# Patient Record
Sex: Female | Born: 1960 | Race: White | Hispanic: No | Marital: Married | Smoking: Former smoker
Health system: Southern US, Community
[De-identification: ages and names within clinical notes are randomized; demographics above are authoritative.]

## PROBLEM LIST (undated history)

## (undated) DIAGNOSIS — J45909 Unspecified asthma, uncomplicated: Secondary | ICD-10-CM

## (undated) DIAGNOSIS — G4730 Sleep apnea, unspecified: Secondary | ICD-10-CM

## (undated) DIAGNOSIS — Z0000 Encounter for general adult medical examination without abnormal findings: Secondary | ICD-10-CM

## (undated) DIAGNOSIS — E559 Vitamin D deficiency, unspecified: Secondary | ICD-10-CM

## (undated) DIAGNOSIS — M47815 Spondylosis without myelopathy or radiculopathy, thoracolumbar region: Secondary | ICD-10-CM

## (undated) DIAGNOSIS — G729 Myopathy, unspecified: Secondary | ICD-10-CM

## (undated) DIAGNOSIS — F419 Anxiety disorder, unspecified: Secondary | ICD-10-CM

## (undated) DIAGNOSIS — H409 Unspecified glaucoma: Secondary | ICD-10-CM

## (undated) DIAGNOSIS — F447 Conversion disorder with mixed symptom presentation: Secondary | ICD-10-CM

## (undated) DIAGNOSIS — F418 Other specified anxiety disorders: Secondary | ICD-10-CM

## (undated) DIAGNOSIS — G1223 Primary lateral sclerosis: Secondary | ICD-10-CM

## (undated) DIAGNOSIS — M810 Age-related osteoporosis without current pathological fracture: Secondary | ICD-10-CM

## (undated) DIAGNOSIS — IMO0001 Reserved for inherently not codable concepts without codable children: Secondary | ICD-10-CM

## (undated) DIAGNOSIS — T8859XA Other complications of anesthesia, initial encounter: Secondary | ICD-10-CM

## (undated) DIAGNOSIS — G43909 Migraine, unspecified, not intractable, without status migrainosus: Secondary | ICD-10-CM

## (undated) DIAGNOSIS — M479 Spondylosis, unspecified: Secondary | ICD-10-CM

## (undated) DIAGNOSIS — E538 Deficiency of other specified B group vitamins: Secondary | ICD-10-CM

## (undated) DIAGNOSIS — M8580 Other specified disorders of bone density and structure, unspecified site: Secondary | ICD-10-CM

## (undated) DIAGNOSIS — E782 Mixed hyperlipidemia: Secondary | ICD-10-CM

## (undated) DIAGNOSIS — N189 Chronic kidney disease, unspecified: Secondary | ICD-10-CM

## (undated) DIAGNOSIS — M542 Cervicalgia: Secondary | ICD-10-CM

## (undated) DIAGNOSIS — J181 Lobar pneumonia, unspecified organism: Secondary | ICD-10-CM

## (undated) DIAGNOSIS — Z931 Gastrostomy status: Secondary | ICD-10-CM

## (undated) DIAGNOSIS — J329 Chronic sinusitis, unspecified: Secondary | ICD-10-CM

## (undated) DIAGNOSIS — M5030 Other cervical disc degeneration, unspecified cervical region: Secondary | ICD-10-CM

## (undated) DIAGNOSIS — K5903 Drug induced constipation: Secondary | ICD-10-CM

## (undated) DIAGNOSIS — T7840XA Allergy, unspecified, initial encounter: Secondary | ICD-10-CM

## (undated) DIAGNOSIS — T4145XA Adverse effect of unspecified anesthetic, initial encounter: Secondary | ICD-10-CM

## (undated) DIAGNOSIS — Z8619 Personal history of other infectious and parasitic diseases: Secondary | ICD-10-CM

## (undated) HISTORY — PX: AUGMENTATION MAMMAPLASTY: SUR837

## (undated) HISTORY — DX: Other specified disorders of bone density and structure, unspecified site: M85.80

## (undated) HISTORY — PX: APPENDECTOMY: SHX54

## (undated) HISTORY — DX: Deficiency of other specified B group vitamins: E53.8

## (undated) HISTORY — PX: NECK SURGERY: SHX720

## (undated) HISTORY — DX: Cervicalgia: M54.2

## (undated) HISTORY — DX: Chronic sinusitis, unspecified: J32.9

## (undated) HISTORY — DX: Sleep apnea, unspecified: G47.30

## (undated) HISTORY — DX: Mixed hyperlipidemia: E78.2

## (undated) HISTORY — PX: COLONOSCOPY: SHX174

## (undated) HISTORY — DX: Personal history of other infectious and parasitic diseases: Z86.19

## (undated) HISTORY — DX: Conversion disorder with mixed symptom presentation: F44.7

## (undated) HISTORY — DX: Encounter for general adult medical examination without abnormal findings: Z00.00

## (undated) HISTORY — PX: BREAST ENHANCEMENT SURGERY: SHX7

## (undated) HISTORY — DX: Lobar pneumonia, unspecified organism: J18.1

## (undated) HISTORY — PX: ABDOMINAL HYSTERECTOMY: SHX81

## (undated) HISTORY — DX: Allergy, unspecified, initial encounter: T78.40XA

## (undated) HISTORY — DX: Age-related osteoporosis without current pathological fracture: M81.0

## (undated) HISTORY — DX: Unspecified glaucoma: H40.9

## (undated) HISTORY — DX: Vitamin D deficiency, unspecified: E55.9

## (undated) HISTORY — DX: Other specified anxiety disorders: F41.8

## (undated) HISTORY — PX: PEG PLACEMENT: SHX5437

## (undated) HISTORY — PX: CHOLECYSTECTOMY: SHX55

## (undated) HISTORY — DX: Unspecified asthma, uncomplicated: J45.909

## (undated) HISTORY — PX: PORTA CATH REMOVAL: CATH118286

## (undated) HISTORY — PX: PORTA CATH INSERTION: CATH118285

## (undated) HISTORY — PX: ECTOPIC PREGNANCY SURGERY: SHX613

---

## 2008-08-17 ENCOUNTER — Emergency Department (HOSPITAL_BASED_OUTPATIENT_CLINIC_OR_DEPARTMENT_OTHER): Admission: EM | Admit: 2008-08-17 | Discharge: 2008-08-17 | Payer: Self-pay | Admitting: Emergency Medicine

## 2008-09-16 ENCOUNTER — Emergency Department (HOSPITAL_BASED_OUTPATIENT_CLINIC_OR_DEPARTMENT_OTHER): Admission: EM | Admit: 2008-09-16 | Discharge: 2008-09-17 | Payer: Self-pay | Admitting: Emergency Medicine

## 2010-09-26 ENCOUNTER — Encounter: Payer: Self-pay | Admitting: *Deleted

## 2010-09-26 ENCOUNTER — Emergency Department (HOSPITAL_BASED_OUTPATIENT_CLINIC_OR_DEPARTMENT_OTHER)
Admission: EM | Admit: 2010-09-26 | Discharge: 2010-09-26 | Disposition: A | Discharge status: Home / Self Care | Payer: Medicare Other | Attending: Emergency Medicine | Admitting: Emergency Medicine

## 2010-09-26 ENCOUNTER — Emergency Department (INDEPENDENT_AMBULATORY_CARE_PROVIDER_SITE_OTHER): Payer: Medicare Other

## 2010-09-26 DIAGNOSIS — R609 Edema, unspecified: Secondary | ICD-10-CM

## 2010-09-26 DIAGNOSIS — X500XXA Overexertion from strenuous movement or load, initial encounter: Secondary | ICD-10-CM | POA: Insufficient documentation

## 2010-09-26 DIAGNOSIS — S93409A Sprain of unspecified ligament of unspecified ankle, initial encounter: Secondary | ICD-10-CM

## 2010-09-26 DIAGNOSIS — M25579 Pain in unspecified ankle and joints of unspecified foot: Secondary | ICD-10-CM

## 2010-09-26 DIAGNOSIS — F17200 Nicotine dependence, unspecified, uncomplicated: Secondary | ICD-10-CM | POA: Insufficient documentation

## 2010-09-26 NOTE — ED Notes (Signed)
EDNP [facility] notified of pt's VS

## 2010-09-26 NOTE — ED Notes (Signed)
D/c home with husband- no new rx given- pt alert and conversant at time of d/c- ice pack given for home use

## 2010-09-26 NOTE — ED Notes (Signed)
Pt states that she fell about a month ago and has had problems with her right ankle since. Does not want a cast d/t Doreatha Martin

## 2010-09-26 NOTE — ED Provider Notes (Signed)
History     CSN: [id number] Arrival date & time: 09/26/2010  4:54 PM  Chief Complaint  Patient presents with  . Ankle Pain    HPI  (Consider location/radiation/quality/duration/timing/severity/associated sxs/prior treatment)  HPI Comments: Pt states that she twisted it about 1 month ago and she has continued to have pain:pt states that she doesn't want a cast do her lou gehrig's:pt states that she is primarily in the wheelchair  Patient is a 50 y.o. female presenting with ankle pain. The history is provided by the patient.  Ankle Pain  The incident occurred more than 1 week ago. The incident occurred at home. The injury mechanism was torsion. The pain is present in the right ankle. The quality of the pain is described as aching. The pain is moderate. The pain has been constant since onset. She reports no foreign bodies present.    Past Medical History  Diagnosis Date  . [name]     Past Surgical History  Procedure Date  . Abdominal hysterectomy   . Cholecystectomy   . Appendectomy     History reviewed. No pertinent family history.  History  Substance Use Topics  . Smoking status: Current Everyday Smoker  . Smokeless tobacco: Not on file  . Alcohol Use: No    OB History    Grav Para Term Preterm Abortions TAB SAB Ect Mult Living                  Review of Systems  Review of Systems  Respiratory: Negative.   Cardiovascular: Negative.   Skin: Negative.   Neurological: Positive for weakness.    Allergies  Review of patient's allergies indicates not on file.  Home Medications  No current outpatient prescriptions on file.  Physical Exam    BP 88/30  Pulse 98  Temp(Src) 99.1 F (37.3 C) (Oral)  Resp 18  Ht 5' 3.5" (1.613 m)  Wt 120 lb (54.432 kg)  BMI 20.92 kg/m2  SpO2 93%  Physical Exam  Nursing note and vitals reviewed. Constitutional: She is oriented to person, place, and time. She appears well-developed and well-nourished.    Cardiovascular: Normal rate.   Pulmonary/Chest: Effort normal. She has rales.  Musculoskeletal:       Pt has generalized tenderness to the right ankle:no obvious deformity or swelling when compared with other ankle  Neurological: She is alert and oriented to person, place, and time.  Skin: Skin is warm and dry.    ED Course  Procedures (including critical care time)  No results found for this or any previous visit. Dg Ankle Complete Right  09/26/2010  *RADIOLOGY REPORT*  Clinical Data: Twisting injury 1 month ago.  Pain.  RIGHT ANKLE - COMPLETE 3+ VIEW  Comparison: None.  Findings: Mild lateral malleolar soft tissue swelling. No acute fracture or dislocation.  Talar dome and base of fifth metatarsal are intact.  Mild tibiotalar osteoarthritis. Remote trauma versus accessory ossicle adjacent the medial malleolus.  IMPRESSION: Mild soft tissue swelling, without acute osseous abnormality.  Original Report Authenticated By: Consuello Bossier, M.D.    MDM Pt placed in aso for comfort:pt is okay to follow up as needed:pt is on a morphine WUJ:WJXBJ pressure likely related to disease process:pt is on hospice        Teressa Lower, NP 09/26/10 1905

## 2010-09-27 NOTE — ED Provider Notes (Signed)
Medical screening examination/treatment/procedure(s) were performed by non-physician practitioner and as supervising physician I was immediately available for consultation/collaboration.   Shelda Jakes, MD 09/27/10 [phone]

## 2012-05-18 ENCOUNTER — Inpatient Hospital Stay (HOSPITAL_BASED_OUTPATIENT_CLINIC_OR_DEPARTMENT_OTHER)
Admission: EM | Admit: 2012-05-18 | Discharge: 2012-05-20 | DRG: 603 | Disposition: A | Discharge status: Home / Self Care | Payer: Medicare Other | Attending: Internal Medicine | Admitting: Internal Medicine

## 2012-05-18 ENCOUNTER — Emergency Department (HOSPITAL_BASED_OUTPATIENT_CLINIC_OR_DEPARTMENT_OTHER): Payer: Medicare Other

## 2012-05-18 ENCOUNTER — Encounter (HOSPITAL_BASED_OUTPATIENT_CLINIC_OR_DEPARTMENT_OTHER): Payer: Self-pay | Admitting: Emergency Medicine

## 2012-05-18 DIAGNOSIS — R031 Nonspecific low blood-pressure reading: Secondary | ICD-10-CM | POA: Diagnosis present

## 2012-05-18 DIAGNOSIS — L02211 Cutaneous abscess of abdominal wall: Secondary | ICD-10-CM

## 2012-05-18 DIAGNOSIS — L988 Other specified disorders of the skin and subcutaneous tissue: Secondary | ICD-10-CM | POA: Diagnosis present

## 2012-05-18 DIAGNOSIS — G1221 Amyotrophic lateral sclerosis: Secondary | ICD-10-CM

## 2012-05-18 DIAGNOSIS — G43909 Migraine, unspecified, not intractable, without status migrainosus: Secondary | ICD-10-CM | POA: Diagnosis present

## 2012-05-18 DIAGNOSIS — F447 Conversion disorder with mixed symptom presentation: Secondary | ICD-10-CM | POA: Diagnosis present

## 2012-05-18 DIAGNOSIS — R109 Unspecified abdominal pain: Secondary | ICD-10-CM | POA: Diagnosis present

## 2012-05-18 DIAGNOSIS — L02219 Cutaneous abscess of trunk, unspecified: Principal | ICD-10-CM | POA: Diagnosis present

## 2012-05-18 DIAGNOSIS — L03311 Cellulitis of abdominal wall: Secondary | ICD-10-CM | POA: Diagnosis present

## 2012-05-18 DIAGNOSIS — I959 Hypotension, unspecified: Secondary | ICD-10-CM | POA: Diagnosis present

## 2012-05-18 DIAGNOSIS — L03319 Cellulitis of trunk, unspecified: Principal | ICD-10-CM | POA: Diagnosis present

## 2012-05-18 DIAGNOSIS — F411 Generalized anxiety disorder: Secondary | ICD-10-CM | POA: Diagnosis present

## 2012-05-18 LAB — COMPREHENSIVE METABOLIC PANEL
ALT: 12 U/L (ref 0–35)
CO2: 28 mEq/L (ref 19–32)
Calcium: 9.4 mg/dL (ref 8.4–10.5)
Creatinine, Ser: 1 mg/dL (ref 0.50–1.10)
GFR calc Af Amer: 74 mL/min — ABNORMAL LOW (ref >90)
GFR calc non Af Amer: 64 mL/min — ABNORMAL LOW (ref >90)
Glucose, Bld: 88 mg/dL (ref 70–99)
Sodium: 140 mEq/L (ref 135–145)
Total Protein: 6.9 g/dL (ref 6.0–8.3)

## 2012-05-18 LAB — CBC WITH DIFFERENTIAL/PLATELET
Eosinophils Absolute: 0.2 10*3/uL (ref 0.0–0.7)
Eosinophils Relative: 2 % (ref 0–5)
HCT: 38.4 % (ref 36.0–46.0)
Lymphs Abs: 2.6 10*3/uL (ref 0.7–4.0)
MCH: 30.3 pg (ref 26.0–34.0)
MCV: 88.3 fL (ref 78.0–100.0)
Monocytes Absolute: 0.8 10*3/uL (ref 0.1–1.0)
Platelets: 209 10*3/uL (ref 150–400)
RBC: 4.35 MIL/uL (ref 3.87–5.11)
RDW: 12 % (ref 11.5–15.5)

## 2012-05-18 LAB — LIPASE, BLOOD: Lipase: 20 U/L (ref 11–59)

## 2012-05-18 MED ORDER — ONDANSETRON HCL 4 MG/2ML IJ SOLN
4.0000 mg | Freq: Once | INTRAMUSCULAR | Status: AC
  Administered 2012-05-18: 4 mg via INTRAVENOUS
  Filled 2012-05-18: qty 2

## 2012-05-18 MED ORDER — SODIUM CHLORIDE 0.9 % IV BOLUS (SEPSIS)
1000.0000 mL | Freq: Once | INTRAVENOUS | Status: AC
  Administered 2012-05-18: 1000 mL via INTRAVENOUS

## 2012-05-18 MED ORDER — IOHEXOL 300 MG/ML  SOLN: 100.0000 mL | Freq: Once | INTRAMUSCULAR | Status: AC | PRN

## 2012-05-18 MED ORDER — HYDROMORPHONE HCL PF 1 MG/ML IJ SOLN
1.0000 mg | Freq: Once | INTRAMUSCULAR | Status: AC
  Administered 2012-05-18: 1 mg via INTRAVENOUS
  Filled 2012-05-18: qty 1

## 2012-05-18 NOTE — ED Notes (Signed)
Epigastric pain and bloating. Exudate from previous PEG site (DC'd 2 yrs ago).  Saw PMD yesterday.  No rx given.  CT abd today at [facility]. Sx getting worse.

## 2012-05-18 NOTE — ED Provider Notes (Signed)
History     CSN: [id number]  Arrival date & time 05/18/12  2147   First MD Initiated Contact with Patient 05/18/12 2252      Chief Complaint  Patient presents with  . Abdominal Pain    (Consider location/radiation/quality/duration/timing/severity/associated sxs/prior treatment) Patient is a 52 y.o. female presenting with abdominal pain. The history is provided by the patient.  Abdominal Pain Pain location:  Periumbilical and epigastric Pain quality: bloating, sharp, shooting and stabbing   Pain radiates to:  Does not radiate Pain severity:  Severe Onset quality:  Gradual Duration:  2 days Timing:  Constant Progression:  Worsening Chronicity:  New Context: eating and previous surgery   Context comment:  States that 2 years she had her feeding tube removed and for the last 2 days has had worsening pain around the scar and today had green drainage from the site Relieved by:  Nothing Worsened by:  Eating, movement and palpation Ineffective treatments:  None tried Associated symptoms: anorexia, flatus and nausea   Associated symptoms: no cough, no diarrhea, no dysuria, no fever, no shortness of breath and no vomiting   Risk factors: no alcohol abuse and no aspirin use   Risk factors comment:  Hx of lou gehrig but resolution of sx   Past Medical History  Diagnosis Date  . [name]     Past Surgical History  Procedure Laterality Date  . Abdominal hysterectomy    . Cholecystectomy    . Appendectomy    . Peg placement    . Neck surgery      No family history on file.  History  Substance Use Topics  . Smoking status: Current Every Day Smoker -- 0.50 packs/day  . Smokeless tobacco: Not on file  . Alcohol Use: No    OB History   Grav Para Term Preterm Abortions TAB SAB Ect Mult Living                  Review of Systems  Constitutional: Negative for fever.  Respiratory: Negative for cough and shortness of breath.   Gastrointestinal: Positive for  nausea, abdominal pain, anorexia and flatus. Negative for vomiting and diarrhea.  Genitourinary: Negative for dysuria.  All other systems reviewed and are negative.    Allergies  Codeine  Home Medications   Current Outpatient Rx  Name  Route  Sig  Dispense  Refill  . buPROPion (WELLBUTRIN XL) 300 MG 24 hr tablet   Oral   Take 300 mg by mouth daily.         . clonazePAM (KLONOPIN) 1 MG tablet   Oral   Take 1 mg by mouth 2 (two) times daily as needed for anxiety.         Marland Kitchen estradiol (ESTRACE) 0.5 MG tablet   Oral   Take 0.5 mg by mouth daily.         . SUMAtriptan (IMITREX) 100 MG tablet   Oral   Take 100 mg by mouth as needed for migraine.         . baclofen (LIORESAL) 10 MG tablet   Oral   Take 10 mg by mouth 2 (two) times daily.           Marland Kitchen desvenlafaxine (PRISTIQ) 100 MG 24 hr tablet   Oral   Take 100 mg by mouth daily.           . diazepam (VALIUM) 5 MG tablet   Oral   Take 5 mg by mouth  2 (two) times daily.           . methadone (DOLOPHINE) 5 MG tablet   Oral   Take 15 mg by mouth 2 (two) times daily.           . sodium chloride 0.9 % SOLN 100 mL with morphine 50 MG/ML SOLN 5 mg/mL   Intravenous   Inject 5 mg/hr into the vein continuous.           Marland Kitchen tiZANidine (ZANAFLEX) 4 MG tablet   Oral   Take 4 mg by mouth every morning.           Marland Kitchen tiZANidine (ZANAFLEX) 4 MG tablet   Oral   Take 8 mg by mouth at bedtime.           . TRAZODONE HCL PO   Oral   Take 1 tablet by mouth at bedtime.             BP 115/60  Pulse 76  Temp(Src) 98.7 F (37.1 C) (Oral)  Resp 14  Ht 5\' 3"  (1.6 m)  Wt 118 lb (53.524 kg)  BMI 20.91 kg/m2  SpO2 96%  Physical Exam  Nursing note and vitals reviewed. Constitutional: She is oriented to person, place, and time. She appears well-developed and well-nourished. She appears distressed.  HENT:  Head: Normocephalic and atraumatic.  Mouth/Throat: Oropharynx is clear and moist. Mucous membranes are  dry.  Eyes: Conjunctivae and EOM are normal. Pupils are equal, round, and reactive to light.  Neck: Normal range of motion. Neck supple.  Cardiovascular: Normal rate, regular rhythm and intact distal pulses.   No murmur heard. Pulmonary/Chest: Effort normal and breath sounds normal. No respiratory distress. She has no wheezes. She has no rales.  Abdominal: Soft. Bowel sounds are normal. She exhibits no distension. There is tenderness in the epigastric area and periumbilical area. There is no rebound, no guarding and no CVA tenderness.    Musculoskeletal: Normal range of motion. She exhibits no edema and no tenderness.  Neurological: She is alert and oriented to person, place, and time.  Skin: Skin is warm and dry. No rash noted. No erythema.  Psychiatric: She has a normal mood and affect. Her behavior is normal.    ED Course  Procedures (including critical care time)  Labs Reviewed  COMPREHENSIVE METABOLIC PANEL - Abnormal; Notable for the following:    Total Bilirubin 0.2 (*)    GFR calc non Af Amer 64 (*)    GFR calc Af Amer 74 (*)    All other components within normal limits  CBC WITH DIFFERENTIAL  LIPASE, BLOOD   No results found.   No diagnosis found.    MDM   Patient with a past history of a G-tube which has been removed, status post cholecystectomy, abdominal hysterectomy and appendectomy.  The last 2 days patient has had epigastric pain and pain around the G-tube site as well as bloating and nausea. She denies any vomiting and has had a normal bowel movement today.  She also noted drainage from her G-tube site today. Small amount of exudate in the site but no frank pus or signs of abscess present. Patient has normal vital signs and normal CBC, CMP and lipase. She denies any change in medications. The patient had a CT scan at [facility] today and will attempt to retrieve those records. Patient given pain and nausea control.       Gwyneth Sprout, MD 05/18/12  2317

## 2012-05-19 ENCOUNTER — Encounter (HOSPITAL_BASED_OUTPATIENT_CLINIC_OR_DEPARTMENT_OTHER): Payer: Self-pay

## 2012-05-19 ENCOUNTER — Encounter (HOSPITAL_COMMUNITY)
Admission: EM | Disposition: A | Discharge status: Home / Self Care | Payer: Self-pay | Source: Home / Self Care | Attending: Internal Medicine

## 2012-05-19 DIAGNOSIS — L03311 Cellulitis of abdominal wall: Secondary | ICD-10-CM | POA: Diagnosis present

## 2012-05-19 DIAGNOSIS — I959 Hypotension, unspecified: Secondary | ICD-10-CM | POA: Diagnosis present

## 2012-05-19 DIAGNOSIS — R109 Unspecified abdominal pain: Secondary | ICD-10-CM

## 2012-05-19 DIAGNOSIS — L988 Other specified disorders of the skin and subcutaneous tissue: Secondary | ICD-10-CM | POA: Diagnosis present

## 2012-05-19 DIAGNOSIS — L089 Local infection of the skin and subcutaneous tissue, unspecified: Secondary | ICD-10-CM

## 2012-05-19 DIAGNOSIS — F447 Conversion disorder with mixed symptom presentation: Secondary | ICD-10-CM | POA: Diagnosis present

## 2012-05-19 HISTORY — PX: ESOPHAGOGASTRODUODENOSCOPY: SHX5428

## 2012-05-19 HISTORY — DX: Conversion disorder with mixed symptom presentation: F44.7

## 2012-05-19 LAB — URINALYSIS, ROUTINE W REFLEX MICROSCOPIC
Bilirubin Urine: NEGATIVE
Glucose, UA: NEGATIVE mg/dL
Hgb urine dipstick: NEGATIVE
Ketones, ur: NEGATIVE mg/dL
Nitrite: NEGATIVE
Specific Gravity, Urine: >1.046 — ABNORMAL HIGH (ref 1.005–1.030)
pH: 5.5 (ref 5.0–8.0)

## 2012-05-19 LAB — CBC
HCT: 33.2 % — ABNORMAL LOW (ref 36.0–46.0)
Hemoglobin: 11.3 g/dL — ABNORMAL LOW (ref 12.0–15.0)
MCV: 87.6 fL (ref 78.0–100.0)
RDW: 12.4 % (ref 11.5–15.5)
WBC: 7.6 10*3/uL (ref 4.0–10.5)

## 2012-05-19 LAB — LACTIC ACID, PLASMA: Lactic Acid, Venous: 1.1 mmol/L (ref 0.5–2.2)

## 2012-05-19 LAB — CREATININE, SERUM: GFR calc Af Amer: 76 mL/min — ABNORMAL LOW (ref >90)

## 2012-05-19 LAB — GLUCOSE, CAPILLARY: Glucose-Capillary: 82 mg/dL (ref 70–99)

## 2012-05-19 SURGERY — EGD (ESOPHAGOGASTRODUODENOSCOPY)
Anesthesia: Topical

## 2012-05-19 MED ORDER — MORPHINE SULFATE 4 MG/ML IJ SOLN
INTRAMUSCULAR | Status: AC
  Filled 2012-05-19: qty 1

## 2012-05-19 MED ORDER — ONDANSETRON HCL 4 MG/2ML IJ SOLN: 4.0000 mg | Freq: Four times a day (QID) | INTRAMUSCULAR | Status: DC | PRN

## 2012-05-19 MED ORDER — SODIUM CHLORIDE 0.9 % IV SOLN
INTRAVENOUS | Status: DC
  Administered 2012-05-19: 500 mL via INTRAVENOUS

## 2012-05-19 MED ORDER — HEPARIN SODIUM (PORCINE) 5000 UNIT/ML IJ SOLN
5000.0000 [IU] | Freq: Three times a day (TID) | INTRAMUSCULAR | Status: DC
  Filled 2012-05-19 (×3): qty 1

## 2012-05-19 MED ORDER — BUPROPION HCL ER (XL) 300 MG PO TB24
300.0000 mg | ORAL_TABLET | Freq: Every day | ORAL | Status: DC
  Administered 2012-05-19 – 2012-05-20 (×2): 300 mg via ORAL
  Filled 2012-05-19 (×2): qty 1

## 2012-05-19 MED ORDER — FLUCONAZOLE 100 MG PO TABS
100.0000 mg | ORAL_TABLET | Freq: Every day | ORAL | Status: DC
  Administered 2012-05-19 – 2012-05-20 (×2): 100 mg via ORAL
  Filled 2012-05-19 (×2): qty 1

## 2012-05-19 MED ORDER — PIPERACILLIN-TAZOBACTAM 3.375 G IVPB 30 MIN
3.3750 g | Freq: Three times a day (TID) | INTRAVENOUS | Status: DC
  Administered 2012-05-19 – 2012-05-20 (×3): 3.375 g via INTRAVENOUS
  Filled 2012-05-19 (×5): qty 50

## 2012-05-19 MED ORDER — OXYCODONE-ACETAMINOPHEN 5-325 MG PO TABS
1.0000 | ORAL_TABLET | ORAL | Status: DC | PRN
  Administered 2012-05-19 – 2012-05-20 (×3): 2 via ORAL
  Filled 2012-05-19 (×3): qty 2

## 2012-05-19 MED ORDER — SODIUM CHLORIDE 0.9 % IV BOLUS (SEPSIS)
1000.0000 mL | Freq: Once | INTRAVENOUS | Status: AC
  Administered 2012-05-19: 1000 mL via INTRAVENOUS

## 2012-05-19 MED ORDER — TIZANIDINE HCL 4 MG PO TABS
4.0000 mg | ORAL_TABLET | Freq: Every day | ORAL | Status: DC
  Administered 2012-05-19 – 2012-05-20 (×2): 4 mg via ORAL
  Filled 2012-05-19 (×2): qty 1

## 2012-05-19 MED ORDER — TRAZODONE HCL 50 MG PO TABS
50.0000 mg | ORAL_TABLET | Freq: Every day | ORAL | Status: DC
  Administered 2012-05-19: 50 mg via ORAL
  Filled 2012-05-19 (×2): qty 1

## 2012-05-19 MED ORDER — TIZANIDINE HCL 4 MG PO TABS
8.0000 mg | ORAL_TABLET | Freq: Every day | ORAL | Status: DC
  Administered 2012-05-19: 8 mg via ORAL
  Filled 2012-05-19 (×2): qty 2

## 2012-05-19 MED ORDER — SODIUM CHLORIDE 0.9 % IV SOLN
INTRAVENOUS | Status: DC
  Administered 2012-05-19 (×2): via INTRAVENOUS

## 2012-05-19 MED ORDER — MORPHINE SULFATE 2 MG/ML IJ SOLN
2.0000 mg | INTRAMUSCULAR | Status: DC | PRN
  Administered 2012-05-19 (×4): 2 mg via INTRAVENOUS
  Filled 2012-05-19 (×5): qty 1

## 2012-05-19 MED ORDER — PIPERACILLIN-TAZOBACTAM 3.375 G IVPB 30 MIN
3.3750 g | Freq: Once | INTRAVENOUS | Status: AC
  Administered 2012-05-19: 3.375 g via INTRAVENOUS
  Filled 2012-05-19 (×2): qty 50

## 2012-05-19 MED ORDER — MIDAZOLAM HCL 5 MG/ML IJ SOLN
INTRAMUSCULAR | Status: AC
  Filled 2012-05-19: qty 2

## 2012-05-19 MED ORDER — DIPHENHYDRAMINE HCL 25 MG PO CAPS
25.0000 mg | ORAL_CAPSULE | Freq: Once | ORAL | Status: AC
  Administered 2012-05-19: 25 mg via ORAL
  Filled 2012-05-19: qty 1

## 2012-05-19 MED ORDER — ONDANSETRON HCL 4 MG PO TABS: 4.0000 mg | ORAL_TABLET | Freq: Four times a day (QID) | ORAL | Status: DC | PRN

## 2012-05-19 MED ORDER — LIDOCAINE VISCOUS 2 % MT SOLN
OROMUCOSAL | Status: AC
  Filled 2012-05-19: qty 15

## 2012-05-19 MED ORDER — SUMATRIPTAN SUCCINATE 100 MG PO TABS: 100.0000 mg | ORAL_TABLET | ORAL | Status: DC | PRN

## 2012-05-19 MED ORDER — LIDOCAINE VISCOUS 2 % MT SOLN
OROMUCOSAL | Status: DC | PRN
  Administered 2012-05-19: 10 mL via OROMUCOSAL

## 2012-05-19 MED ORDER — IOHEXOL 300 MG/ML  SOLN
100.0000 mL | Freq: Once | INTRAMUSCULAR | Status: AC | PRN
  Administered 2012-05-19: 100 mL via INTRAVENOUS

## 2012-05-19 MED ORDER — VANCOMYCIN HCL IN DEXTROSE 1-5 GM/200ML-% IV SOLN
1000.0000 mg | Freq: Two times a day (BID) | INTRAVENOUS | Status: DC
  Administered 2012-05-19 – 2012-05-20 (×3): 1000 mg via INTRAVENOUS
  Filled 2012-05-19 (×4): qty 200

## 2012-05-19 MED ORDER — SUMATRIPTAN SUCCINATE 100 MG PO TABS
100.0000 mg | ORAL_TABLET | ORAL | Status: DC | PRN
  Filled 2012-05-19: qty 1

## 2012-05-19 MED ORDER — VANCOMYCIN HCL IN DEXTROSE 1-5 GM/200ML-% IV SOLN
1000.0000 mg | Freq: Once | INTRAVENOUS | Status: AC
  Administered 2012-05-19: 1000 mg via INTRAVENOUS
  Filled 2012-05-19: qty 200

## 2012-05-19 MED ORDER — CLONAZEPAM 1 MG PO TABS
1.0000 mg | ORAL_TABLET | Freq: Two times a day (BID) | ORAL | Status: DC | PRN
  Administered 2012-05-19 (×2): 1 mg via ORAL
  Filled 2012-05-19 (×2): qty 1

## 2012-05-19 MED ORDER — FENTANYL CITRATE 0.05 MG/ML IJ SOLN
INTRAMUSCULAR | Status: AC
  Filled 2012-05-19: qty 2

## 2012-05-19 MED ORDER — DIPHENHYDRAMINE HCL 50 MG/ML IJ SOLN
12.5000 mg | Freq: Once | INTRAMUSCULAR | Status: AC
  Administered 2012-05-19: 12.5 mg via INTRAVENOUS
  Filled 2012-05-19: qty 1

## 2012-05-19 MED ORDER — ESTRADIOL 1 MG PO TABS
0.5000 mg | ORAL_TABLET | Freq: Every day | ORAL | Status: DC
  Administered 2012-05-19 – 2012-05-20 (×2): 0.5 mg via ORAL
  Filled 2012-05-19 (×2): qty 0.5

## 2012-05-19 MED ORDER — IOHEXOL 300 MG/ML  SOLN
50.0000 mL | Freq: Once | INTRAMUSCULAR | Status: AC | PRN
  Administered 2012-05-19: 50 mL via ORAL

## 2012-05-19 MED ORDER — ESTRADIOL 1 MG PO TABS: 0.5000 mg | ORAL_TABLET | Freq: Every day | ORAL | Status: DC

## 2012-05-19 NOTE — Progress Notes (Signed)
Addendum  Patient seen and examined, chart and data base reviewed.  I agree with the above assessment and plan.  For full details please see Mrs. Algis Downs PA note.  Abdominal pain and questionable gastrocutaneous fistula GI and en surgery to see.   Clint Lipps, MD [facility] Pager: [phone] 05/19/2012, 3:29 PM

## 2012-05-19 NOTE — Consult Note (Signed)
Reason for Consult: Possible PEG site infection. Referring Physician: Chales Abrahams York PA-C  Kayla [name] is an 52 y.o. female.  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  She reports multiple issues with the tube during her time with it.  She ultimately had it removed 2010.  She says since then she has had intermittent problems with the abdomen swelling up.  It would last a couple days and get better.  She reports pain and swelling since 05/15/12.  She was seen and admitted from [facility].  There is a report of some drainage from the site at that time.  The patient says there was also some drainage yesterday. CT scan obtained showed a fluid tract, from the anterior gastric wall to the skin surface.  I don't see a fluid collection or anything that looks like an abscess. She was seen by Dr. Elnoria Howard and underwent EGD, with no remarkable findings.  The old PEG site was identified with the distal gastric body with no overt abnormalities noted. We were ask to see to evaluate for PEG  Site infection.  Past Medical History  Diagnosis Date   Depression, followed by counselor     . [name]     Past Surgical History  Procedure Laterality Date  . Abdominal hysterectomy    . Cholecystectomy    . Appendectomy    . Peg placement    . Neck surgery    . Esophagogastroduodenoscopy N/A 05/19/2012    Procedure: ESOPHAGOGASTRODUODENOSCOPY (EGD);  Surgeon: Theda Belfast, MD;  Location: [facility] ENDOSCOPY;  Service: Endoscopy;  Laterality: N/A;    No family history on file.  Social History:  reports that she has been smoking.  She does not have any smokeless tobacco history on file. She reports that she does not drink alcohol or use illicit drugs.  Allergies:  Allergies  Allergen Reactions  . Dilaudid (Hydromorphone Hcl) Itching  . Codeine Rash    Medications:  Prior to Admission:  Prescriptions prior to admission  Medication Sig Dispense Refill  .  beta carotene w/minerals (OCUVITE) tablet Take 1 tablet by mouth daily.      Marland Kitchen buPROPion (WELLBUTRIN XL) 300 MG 24 hr tablet Take 300 mg by mouth daily.      . clonazePAM (KLONOPIN) 1 MG tablet Take 1 mg by mouth 2 (two) times daily as needed for anxiety.      Marland Kitchen estradiol (ESTRACE) 0.5 MG tablet Take 0.5 mg by mouth daily.      . Lactobacillus (ACIDOPHILUS PO) Take 1 tablet by mouth daily.      . Magnesium 250 MG TABS Take 1 tablet by mouth daily.      . Multiple Vitamin (MULTIVITAMIN WITH MINERALS) TABS Take 1 tablet by mouth daily.      Marland Kitchen PRESCRIPTION MEDICATION Inhale 2 puffs into the lungs every 6 (six) hours as needed (Inhaler as needed for shortness of breath).      . SUMAtriptan (IMITREX) 100 MG tablet Take 100 mg by mouth as needed for migraine.      Marland Kitchen tiZANidine (ZANAFLEX) 4 MG capsule Take 4 mg by mouth 2 (two) times daily.      . traZODone (DESYREL) 100 MG tablet Take 200 mg by mouth at bedtime.       Scheduled: . buPROPion  300 mg Oral Daily  . estradiol  0.5 mg Oral Daily  . fluconazole  100 mg Oral  Daily  . piperacillin-tazobactam  3.375 g Intravenous Q8H  . tiZANidine  4 mg Oral Daily  . tiZANidine  8 mg Oral QHS  . traZODone  50 mg Oral QHS  . vancomycin  1,000 mg Intravenous Q12H   Continuous: . sodium chloride 100 mL/hr at 05/19/12 0636   ZOX:[id number], morphine injection, ondansetron (ZOFRAN) IV, ondansetron, SUMAtriptan Anti-infectives   Start     Dose/Rate Route Frequency Ordered Stop   05/19/12 1430  fluconazole (DIFLUCAN) tablet 100 mg     100 mg Oral Daily 05/19/12 1317     05/19/12 1000  piperacillin-tazobactam (ZOSYN) IVPB 3.375 g     3.375 g 12.5 mL/hr over 240 Minutes Intravenous Every 8 hours 05/19/12 0627     05/19/12 1000  vancomycin (VANCOCIN) IVPB 1000 mg/200 mL premix     1,000 mg 200 mL/hr over 60 Minutes Intravenous Every 12 hours 05/19/12 0648     05/19/12 0315  vancomycin (VANCOCIN) IVPB 1000 mg/200 mL premix     1,000 mg 200 mL/hr over 60  Minutes Intravenous  Once 05/19/12 0300 05/19/12 0511   05/19/12 0315  piperacillin-tazobactam (ZOSYN) IVPB 3.375 g     3.375 g 100 mL/hr over 30 Minutes Intravenous  Once 05/19/12 0300 05/19/12 0348      Results for orders placed during the hospital encounter of 05/18/12 (from the past 48 hour(s))  CBC WITH DIFFERENTIAL     Status: None   Collection Time    05/18/12  9:40 PM      Result Value Range   WBC 8.4  4.0 - 10.5 K/uL   RBC 4.35  3.87 - 5.11 MIL/uL   Hemoglobin 13.2  12.0 - 15.0 g/dL   HCT 11.9  14.7 - 82.9 %   MCV 88.3  78.0 - 100.0 fL   MCH 30.3  26.0 - 34.0 pg   MCHC 34.4  30.0 - 36.0 g/dL   RDW 56.2  13.0 - 86.5 %   Platelets 209  150 - 400 K/uL   Neutrophils Relative % 57  43 - 77 %   Neutro Abs 4.8  1.7 - 7.7 K/uL   Lymphocytes Relative 31  12 - 46 %   Lymphs Abs 2.6  0.7 - 4.0 K/uL   Monocytes Relative 9  3 - 12 %   Monocytes Absolute 0.8  0.1 - 1.0 K/uL   Eosinophils Relative 2  0 - 5 %   Eosinophils Absolute 0.2  0.0 - 0.7 K/uL   Basophils Relative 0  0 - 1 %   Basophils Absolute 0.0  0.0 - 0.1 K/uL  COMPREHENSIVE METABOLIC PANEL     Status: Abnormal   Collection Time    05/18/12  9:40 PM      Result Value Range   Sodium 140  135 - 145 mEq/L   Potassium 3.6  3.5 - 5.1 mEq/L   Chloride 102  96 - 112 mEq/L   CO2 28  19 - 32 mEq/L   Glucose, Bld 88  70 - 99 mg/dL   BUN 7  6 - 23 mg/dL   Creatinine, Ser 7.84  0.50 - 1.10 mg/dL   Calcium 9.4  8.4 - 69.6 mg/dL   Total Protein 6.9  6.0 - 8.3 g/dL   Albumin 3.7  3.5 - 5.2 g/dL   AST 16  0 - 37 U/L   ALT 12  0 - 35 U/L   Alkaline Phosphatase 57  39 - 117 U/L   Total  Bilirubin 0.2 (*) 0.3 - 1.2 mg/dL   GFR calc non Af Amer 64 (*) >90 mL/min   GFR calc Af Amer 74 (*) >90 mL/min   Comment:            The eGFR has been calculated     using the CKD EPI equation.     This calculation has not been     validated in all clinical     situations.     eGFR's persistently     <90 mL/min signify     possible  Chronic Kidney Disease.  LIPASE, BLOOD     Status: None   Collection Time    05/18/12  9:40 PM      Result Value Range   Lipase 20  11 - 59 U/L  LACTIC ACID, PLASMA     Status: None   Collection Time    05/19/12  3:10 AM      Result Value Range   Lactic Acid, Venous 1.1  0.5 - 2.2 mmol/L  URINALYSIS, ROUTINE W REFLEX MICROSCOPIC     Status: Abnormal   Collection Time    05/19/12  3:53 AM      Result Value Range   Color, Urine YELLOW  YELLOW   APPearance CLEAR  CLEAR   Specific Gravity, Urine >1.046 (*) 1.005 - 1.030   pH 5.5  5.0 - 8.0   Glucose, UA NEGATIVE  NEGATIVE mg/dL   Hgb urine dipstick NEGATIVE  NEGATIVE   Bilirubin Urine NEGATIVE  NEGATIVE   Ketones, ur NEGATIVE  NEGATIVE mg/dL   Protein, ur NEGATIVE  NEGATIVE mg/dL   Urobilinogen, UA 0.2  0.0 - 1.0 mg/dL   Nitrite NEGATIVE  NEGATIVE   Leukocytes, UA NEGATIVE  NEGATIVE   Comment: MICROSCOPIC NOT DONE ON URINES WITH NEGATIVE PROTEIN, BLOOD, LEUKOCYTES, NITRITE, OR GLUCOSE <1000 mg/dL.  CBC     Status: Abnormal   Collection Time    05/19/12  7:56 AM      Result Value Range   WBC 7.6  4.0 - 10.5 K/uL   RBC 3.79 (*) 3.87 - 5.11 MIL/uL   Hemoglobin 11.3 (*) 12.0 - 15.0 g/dL   HCT 78.2 (*) 95.6 - 21.3 %   MCV 87.6  78.0 - 100.0 fL   MCH 29.8  26.0 - 34.0 pg   MCHC 34.0  30.0 - 36.0 g/dL   RDW 08.6  57.8 - 46.9 %   Platelets 195  150 - 400 K/uL  CREATININE, SERUM     Status: Abnormal   Collection Time    05/19/12  7:56 AM      Result Value Range   Creatinine, Ser 0.97  0.50 - 1.10 mg/dL   GFR calc non Af Amer 66 (*) >90 mL/min   GFR calc Af Amer 76 (*) >90 mL/min   Comment:            The eGFR has been calculated     using the CKD EPI equation.     This calculation has not been     validated in all clinical     situations.     eGFR's persistently     <90 mL/min signify     possible Chronic Kidney Disease.  TSH     Status: None   Collection Time    05/19/12  7:56 AM      Result Value Range   TSH 2.400   0.350 - 4.500 uIU/mL  GLUCOSE, CAPILLARY  Status: None   Collection Time    05/19/12  9:25 AM      Result Value Range   Glucose-Capillary 82  70 - 99 mg/dL    Ct Abdomen Pelvis W Contrast  05/19/2012   *RADIOLOGY REPORT*  Clinical Data: Abdominal pain  CT ABDOMEN AND PELVIS WITH CONTRAST  Technique:  Multidetector CT imaging of the abdomen and pelvis was performed following the standard protocol during bolus administration of intravenous contrast.  Contrast: 50mL OMNIPAQUE IOHEXOL 300 MG/ML  SOLN, OMNIPAQUE IOHEXOL 300 MG/ML  SOLN  Comparison: None.  Findings: Mild dependent opacity right lower lobe, favored to reflect atelectasis or scarring.  Heart size within normal limits.  Cyst within the left hepatic lobe. Cholecystectomy.  Mild intra and extrahepatic biliary ductal prominence to the level of the ampulla. No obstructing lesion visualized.  There is mild pancreatic ductal prominence is well, measuring 3 mm. No solid or infiltrative pancreatic mass visualized.  Unremarkable adrenal glands. Unremarkable spleen.  Symmetric renal enhancement.  No hydronephrosis or hydroureter.  No CT evidence for colitis.  Appendix not identified.  No right lower quadrant inflammation.  There is a fluid tract extending from the anterior abdomen skin surface to the anterior margin of the stomach as seen on images 27 and 28.  Presumably this corresponds to the course of a prior gastrostomy tube however correlate clinically.  Small bowel loops are normal course and caliber.  No free intraperitoneal air or fluid.  No lymphadenopathy.  There is scattered atherosclerotic calcification of the aorta and its branches. No aneurysmal dilatation.  Thin-walled bladder.  Absent uterus.  No adnexal mass.  Lumbosacral facet arthropathy.  No acute osseous finding.  IMPRESSION: Mild intra and extrahepatic biliary ductal prominence and mild main pancreatic prominence, both to the level of the ampulla.  Recommend LFT and ERCP  correlation.  No obstructing lesion visualized by CT.  There is a fluid tract extending from the skin surface to the anterior gastric wall.  Presumably this corresponds to the site of a prior gastrostomy tube.  Correlate clinically and with direct inspection of the skin site as a patent communication or infection is not excluded.   Original Report Authenticated By: Jearld Lesch, M.D.    Review of Systems  Constitutional: Positive for chills. Negative for fever, weight loss, malaise/fatigue and diaphoresis.  HENT: Positive for neck pain (she has had some neck surgery.).        She still has trouble swallowing mostly pills.  Eyes: Positive for blurred vision (occasional blurring). Negative for double vision, photophobia, pain, discharge and redness (intermittent,).  Respiratory: Positive for cough. Negative for hemoptysis, sputum production, shortness of breath and wheezing.        She has limited diaphragmatic strength, and was previously on some kind of cpap. She snores some.    Cardiovascular: Negative for chest pain, palpitations, orthopnea, claudication, leg swelling and PND.       Pt says her heart " flips."  Gastrointestinal: Positive for nausea (some nausea, but I can't really discern when.), abdominal pain (she complains of abdominal pain and swelling) and constipation. Negative for heartburn, vomiting, blood in stool and melena. Diarrhea: she has both diarrhea and constipation issues on and off.  Genitourinary: Negative.   Skin: Negative.   Neurological: Negative for dizziness, tingling, tremors, sensory change, speech change, seizures, loss of consciousness and weakness.       She has some breathing weakness, followed by a neurologist.  Endo/Heme/Allergies: Negative for environmental allergies  and polydipsia. Does not bruise/bleed easily.  Psychiatric/Behavioral:       Pt has dementia and is cared for by her family who don't live with her but are in the block around her.   Blood  pressure 99/67, pulse 79, temperature 98 F (36.7 C), temperature source Oral, resp. rate 18, height 5\' 3"  (1.6 m), weight 57.652 kg (127 lb 1.6 oz), SpO2 98.00%. Physical Exam  Constitutional: She is oriented to person, place, and time. She appears well-developed and well-nourished. No distress.  HENT:  Head: Normocephalic and atraumatic.  Nose: Nose normal.  Eyes: Conjunctivae and EOM are normal. Pupils are equal, round, and reactive to light. Right eye exhibits no discharge. Left eye exhibits no discharge. No scleral icterus.  Neck: Normal range of motion. Neck supple. No JVD present. No tracheal deviation present. No thyromegaly present.  Cardiovascular: Normal rate, regular rhythm, normal heart sounds and intact distal pulses.  Exam reveals no gallop and no friction rub.   No murmur heard. Respiratory: Effort normal and breath sounds normal. No respiratory distress. She has no wheezes. She has no rales. She exhibits no tenderness.  GI: Soft. Bowel sounds are normal. She exhibits distension (minimal). She exhibits no mass. There is tenderness (Pain is all centrally located around the closed PEG site.). There is guarding. There is no rebound.  She has a PEG site that we cannot find any kind of tract, with either the cotton side or the stick side of an applicator stick.  There is minimal erythema.  She says her stomach is swollen, and tender at the site.  She says [place] drainage and she had some yesterday, but we do not see anything today.  Musculoskeletal: She exhibits no edema.  Lymphadenopathy:    She has no cervical adenopathy.  Neurological: She is alert and oriented to person, place, and time. No cranial nerve deficit.  Skin: Skin is warm. No rash noted. She is not diaphoretic. No erythema.  Psychiatric: She has a normal mood and affect. Her behavior is normal. Judgment and thought content normal.    Assessment/Plan:  1. PEG site discomfort, but we cannot find an  open tract. 2. [name] 3.Anxiety and depression   Plan:  She is minimally better by her standards since admission.  I do not know of anything different to do at this point.  Dr. [name] will see her after her gets out of the OR. Will [facility] FOR Dr Violeta Gelinas.  Avianah Pellman 05/19/2012, 3:46 PM

## 2012-05-19 NOTE — Progress Notes (Signed)
[facility] PROGRESS NOTE  Arnita Koons ZOX:[id number] DOB: [birth year] DOA: 05/18/2012  PCP: Dr. Bobby Rumpf, [place], [place] [phone] GI:  [facility] [address]   Kayla [name] is an 52 y.o. female previously diagnosed with [name], previously under hospice care, but improved markedly, so that diagnosis was questioned, hx of prior peg tube placement and removal many years ago, hx of CCY, appendectomy, hysterectomy, and had intermittent abdominal pain at the peg site before with spontaneous resolution, presents to [facility] with similar pain, but this time, more severe, and there was greenish discharge from her old peg tube site. She also had some diarrhea, but never was diagnosed with crohn's or other IBD. She had some chills, and nausea, but no vomiting. She denied any chest pain but had some shortness of breath. She said her diaphram is working at 50% capability and that if general anesthesia was to be used, there are certain kind that cannot be used as it triggers her paralysis. She couldn't tell exactly what they were. Evaluation in the ER included a normal WBC, normal HB, normal LFTs, and normal renal fx tests. Her lipase is 20. An abdominal pelvic CT was done showing a track from the abdominal wall to the anterior gastric wall along with prominence of the intra and extra biliary ducts and pancreatic ductal prominence. 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 Surgery was consulted, but felt that perhaps GI could help with endoscopy.   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  PEG removed 4 yrs ago.  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  Appreciate GI, Dr. Haywood Pao, evaluation.  EGD today. Afebrile with normal WBC.  (she is complaining of nausea and diarrhea) Blood Cultures pending. Currently on Vanc and Zosyn  Biliary duct and pancreatic duct  prominance LFTs are normal Will leave further management/evluation to Dr. Haywood Pao discretion.  Abdominal pain Likely secondary to infection PRN Morphine Patient became hypotensive with Dilaudid. Add Kpad for comfort.  Lou Gehrig's disease  Stable.  No current issues.  Migraine with hx of Migraine Ordered Sumatriptan PRN   DVT Prophylaxis:  SCDs (refused Heparin)  Code Status: full code Family Communication:  Disposition Plan: inpatient.  Hopefully home at discharge.   Consultants:  Dr. Elnoria Howard, GI  Procedures: EGD  Antibiotics:  Vanc and Zosyn started at admission  HPI/Subjective: Patient reports abdominal pain and distention.  + Nausea, denies vomiting.  Reports she NEVER has a fever (even when her appendix burst)  Objective: Filed Vitals:   05/19/12 0228 05/19/12 0253 05/19/12 0413 05/19/12 0628  BP: 96/33 98/43 90/32  99/67  Pulse: 72 73 71 77  Temp:    97.5 F (36.4 C)  TempSrc:    Oral  Resp: 16 16 16 18   Height:    5\' 3"  (1.6 m)  Weight:    57.652 kg (127 lb 1.6 oz)  SpO2: 98% 100% 100% 98%   No intake or output data in the 24 hours ending 05/19/12 0919 Filed Weights   05/18/12 2156 05/19/12 0628  Weight: 53.524 kg (118 lb) 57.652 kg (127 lb 1.6 oz)    Exam:   General:  A&O, NAD, Lying comfortably in bed  Cardiovascular: RRR, no murmurs, rubs or gallops, no lower extremity edema  Respiratory: CTA, no wheeze, crackles, or rales.  No increased work of breathing.  Abdomen: Soft, tender to palpation in the epigastrum and LLQ.  Previous  peg site with very mild erythema, no current drainage. +BS  Musculoskeletal: Able to move all 4 extremities, symmetrically weak.  4/5 strength in each  Data Reviewed: Basic Metabolic Panel:  Recent Labs Lab 05/18/12 2140 05/19/12 0756  NA 140  --   K 3.6  --   CL 102  --   CO2 28  --   GLUCOSE 88  --   BUN 7  --   CREATININE 1.00 0.97  CALCIUM 9.4  --    Liver Function Tests:  Recent Labs Lab  05/18/12 2140  AST 16  ALT 12  ALKPHOS 57  BILITOT 0.2*  PROT 6.9  ALBUMIN 3.7    Recent Labs Lab 05/18/12 2140  LIPASE 20   CBC:  Recent Labs Lab 05/18/12 2140 05/19/12 0756  WBC 8.4 7.6  NEUTROABS 4.8  --   HGB 13.2 11.3*  HCT 38.4 33.2*  MCV 88.3 87.6  PLT 209 195    Studies: Ct Abdomen Pelvis W Contrast  05/19/2012   *RADIOLOGY REPORT*  Clinical Data: Abdominal pain  CT ABDOMEN AND PELVIS WITH CONTRAST  Technique:  Multidetector CT imaging of the abdomen and pelvis was performed following the standard protocol during bolus administration of intravenous contrast.  Contrast: 50mL OMNIPAQUE IOHEXOL 300 MG/ML  SOLN, OMNIPAQUE IOHEXOL 300 MG/ML  SOLN  Comparison: None.  Findings: Mild dependent opacity right lower lobe, favored to reflect atelectasis or scarring.  Heart size within normal limits.  Cyst within the left hepatic lobe. Cholecystectomy.  Mild intra and extrahepatic biliary ductal prominence to the level of the ampulla. No obstructing lesion visualized.  There is mild pancreatic ductal prominence is well, measuring 3 mm. No solid or infiltrative pancreatic mass visualized.  Unremarkable adrenal glands. Unremarkable spleen.  Symmetric renal enhancement.  No hydronephrosis or hydroureter.  No CT evidence for colitis.  Appendix not identified.  No right lower quadrant inflammation.  There is a fluid tract extending from the anterior abdomen skin surface to the anterior margin of the stomach as seen on images 27 and 28.  Presumably this corresponds to the course of a prior gastrostomy tube however correlate clinically.  Small bowel loops are normal course and caliber.  No free intraperitoneal air or fluid.  No lymphadenopathy.  There is scattered atherosclerotic calcification of the aorta and its branches. No aneurysmal dilatation.  Thin-walled bladder.  Absent uterus.  No adnexal mass.  Lumbosacral facet arthropathy.  No acute osseous finding.  IMPRESSION: Mild intra and  extrahepatic biliary ductal prominence and mild main pancreatic prominence, both to the level of the ampulla.  Recommend LFT and ERCP correlation.  No obstructing lesion visualized by CT.  There is a fluid tract extending from the skin surface to the anterior gastric wall.  Presumably this corresponds to the site of a prior gastrostomy tube.  Correlate clinically and with direct inspection of the skin site as a patent communication or infection is not excluded.   Original Report Authenticated By: Jearld Lesch, M.D.    Scheduled Meds: . buPROPion  300 mg Oral Daily  . estradiol  0.5 mg Oral Daily  . heparin  5,000 Units Subcutaneous Q8H  . piperacillin-tazobactam  3.375 g Intravenous Q8H  . tiZANidine  4 mg Oral Daily  . tiZANidine  8 mg Oral QHS  . traZODone  50 mg Oral QHS  . vancomycin  1,000 mg Intravenous Q12H   Continuous Infusions: . sodium chloride 100 mL/hr at 05/19/12 0636    Principal  Problem:   Abdominal wall abscess Active Problems:   Cutaneous fistula   Hypotension   Abdominal pain   Doreatha Martin disease    Conley Canal [facility] Pager [phone]. If 7PM-7AM, please contact night-coverage at [email or web address], password [facility] 05/19/2012, 9:19 AM  LOS: 1 day

## 2012-05-19 NOTE — Progress Notes (Signed)
Pt alert and oriented. Complaining of pain. Back from egd

## 2012-05-19 NOTE — Consult Note (Signed)
Reason for Consult: Abnormal CT scan Referring Physician: [facility]  Sunday Shams HPI: This is a 52 year old female with a history of ALS, however, the current feeling is that she may not have the disease.  Four years ago she was admitted to hospice, but she recovered.  Since that time she has been able to regain some function, but she remains weak on the left side.  However, she is able to ambulate.  Her respiratory status is good and she does not require any ventilatory support or supplemental oxygen.  In fact, two years ago she was able to undergo a lap chole with "twilight sedation".  Yesterday she started to have abdominal pain at the site of her prior PEG.  She was able to express a greenish discharge.  The CT scan reveals fluid tracking from the old PEG site to the anterior gastric wall.  A PEG tube was placed 3-4 years ago and ever since the placement she reports problems with recurrent infections.  Ultimately it had to be pulled and she has not had any further issues until now.  No reports of fever.  Past Medical History  Diagnosis Date  . [name]     Past Surgical History  Procedure Laterality Date  . Abdominal hysterectomy    . Cholecystectomy    . Appendectomy    . Peg placement    . Neck surgery      No family history on file.  Social History:  reports that she has been smoking.  She does not have any smokeless tobacco history on file. She reports that she does not drink alcohol or use illicit drugs.  Allergies:  Allergies  Allergen Reactions  . Codeine Rash    Medications:  Scheduled: . buPROPion  300 mg Oral Daily  . estradiol  0.5 mg Oral Daily  . heparin  5,000 Units Subcutaneous Q8H  . piperacillin-tazobactam  3.375 g Intravenous Q8H  . tiZANidine  4 mg Oral Daily  . tiZANidine  8 mg Oral QHS  . traZODone  50 mg Oral QHS  . vancomycin  1,000 mg Intravenous Q12H   Continuous: . sodium chloride 100 mL/hr at 05/19/12 0636    Results for  orders placed during the hospital encounter of 05/18/12 (from the past 24 hour(s))  CBC WITH DIFFERENTIAL     Status: None   Collection Time    05/18/12  9:40 PM      Result Value Range   WBC 8.4  4.0 - 10.5 K/uL   RBC 4.35  3.87 - 5.11 MIL/uL   Hemoglobin 13.2  12.0 - 15.0 g/dL   HCT 40.3  47.4 - 25.9 %   MCV 88.3  78.0 - 100.0 fL   MCH 30.3  26.0 - 34.0 pg   MCHC 34.4  30.0 - 36.0 g/dL   RDW 56.3  87.5 - 64.3 %   Platelets 209  150 - 400 K/uL   Neutrophils Relative % 57  43 - 77 %   Neutro Abs 4.8  1.7 - 7.7 K/uL   Lymphocytes Relative 31  12 - 46 %   Lymphs Abs 2.6  0.7 - 4.0 K/uL   Monocytes Relative 9  3 - 12 %   Monocytes Absolute 0.8  0.1 - 1.0 K/uL   Eosinophils Relative 2  0 - 5 %   Eosinophils Absolute 0.2  0.0 - 0.7 K/uL   Basophils Relative 0  0 - 1 %   Basophils  Absolute 0.0  0.0 - 0.1 K/uL  COMPREHENSIVE METABOLIC PANEL     Status: Abnormal   Collection Time    05/18/12  9:40 PM      Result Value Range   Sodium 140  135 - 145 mEq/L   Potassium 3.6  3.5 - 5.1 mEq/L   Chloride 102  96 - 112 mEq/L   CO2 28  19 - 32 mEq/L   Glucose, Bld 88  70 - 99 mg/dL   BUN 7  6 - 23 mg/dL   Creatinine, Ser 7.82  0.50 - 1.10 mg/dL   Calcium 9.4  8.4 - 95.6 mg/dL   Total Protein 6.9  6.0 - 8.3 g/dL   Albumin 3.7  3.5 - 5.2 g/dL   AST 16  0 - 37 U/L   ALT 12  0 - 35 U/L   Alkaline Phosphatase 57  39 - 117 U/L   Total Bilirubin 0.2 (*) 0.3 - 1.2 mg/dL   GFR calc non Af Amer 64 (*) >90 mL/min   GFR calc Af Amer 74 (*) >90 mL/min  LIPASE, BLOOD     Status: None   Collection Time    05/18/12  9:40 PM      Result Value Range   Lipase 20  11 - 59 U/L  LACTIC ACID, PLASMA     Status: None   Collection Time    05/19/12  3:10 AM      Result Value Range   Lactic Acid, Venous 1.1  0.5 - 2.2 mmol/L  URINALYSIS, ROUTINE W REFLEX MICROSCOPIC     Status: Abnormal   Collection Time    05/19/12  3:53 AM      Result Value Range   Color, Urine YELLOW  YELLOW   APPearance CLEAR   CLEAR   Specific Gravity, Urine >1.046 (*) 1.005 - 1.030   pH 5.5  5.0 - 8.0   Glucose, UA NEGATIVE  NEGATIVE mg/dL   Hgb urine dipstick NEGATIVE  NEGATIVE   Bilirubin Urine NEGATIVE  NEGATIVE   Ketones, ur NEGATIVE  NEGATIVE mg/dL   Protein, ur NEGATIVE  NEGATIVE mg/dL   Urobilinogen, UA 0.2  0.0 - 1.0 mg/dL   Nitrite NEGATIVE  NEGATIVE   Leukocytes, UA NEGATIVE  NEGATIVE     Ct Abdomen Pelvis W Contrast  05/19/2012   *RADIOLOGY REPORT*  Clinical Data: Abdominal pain  CT ABDOMEN AND PELVIS WITH CONTRAST  Technique:  Multidetector CT imaging of the abdomen and pelvis was performed following the standard protocol during bolus administration of intravenous contrast.  Contrast: 50mL OMNIPAQUE IOHEXOL 300 MG/ML  SOLN, OMNIPAQUE IOHEXOL 300 MG/ML  SOLN  Comparison: None.  Findings: Mild dependent opacity right lower lobe, favored to reflect atelectasis or scarring.  Heart size within normal limits.  Cyst within the left hepatic lobe. Cholecystectomy.  Mild intra and extrahepatic biliary ductal prominence to the level of the ampulla. No obstructing lesion visualized.  There is mild pancreatic ductal prominence is well, measuring 3 mm. No solid or infiltrative pancreatic mass visualized.  Unremarkable adrenal glands. Unremarkable spleen.  Symmetric renal enhancement.  No hydronephrosis or hydroureter.  No CT evidence for colitis.  Appendix not identified.  No right lower quadrant inflammation.  There is a fluid tract extending from the anterior abdomen skin surface to the anterior margin of the stomach as seen on images 27 and 28.  Presumably this corresponds to the course of a prior gastrostomy tube however correlate clinically.  Small bowel  loops are normal course and caliber.  No free intraperitoneal air or fluid.  No lymphadenopathy.  There is scattered atherosclerotic calcification of the aorta and its branches. No aneurysmal dilatation.  Thin-walled bladder.  Absent uterus.  No adnexal mass.   Lumbosacral facet arthropathy.  No acute osseous finding.  IMPRESSION: Mild intra and extrahepatic biliary ductal prominence and mild main pancreatic prominence, both to the level of the ampulla.  Recommend LFT and ERCP correlation.  No obstructing lesion visualized by CT.  There is a fluid tract extending from the skin surface to the anterior gastric wall.  Presumably this corresponds to the site of a prior gastrostomy tube.  Correlate clinically and with direct inspection of the skin site as a patent communication or infection is not excluded.   Original Report Authenticated By: Jearld Lesch, M.D.    ROS:  As stated above in the HPI otherwise negative.  Blood pressure 99/67, pulse 77, temperature 97.5 F (36.4 C), temperature source Oral, resp. rate 18, height 5\' 3"  (1.6 m), weight 127 lb 1.6 oz (57.652 kg), SpO2 98.00%.    PE: Gen: NAD, Alert and Oriented HEENT:  [place]/AT, EOMI Neck: Supple, no LAD Lungs: CTA Bilaterally CV: RRR without M/G/R ABM: Soft, tender at the old PEG site, no pus or cellulitis, +BS Ext: No C/C/E  Assessment/Plan: 1) Old PEG site drainage. 2) ? ALS.   Surgery evaluated the patient and recommends an EGD for a luminal evaluation.  I was not able to express any pus and there was no evidence of any induration or erythema at the site.  She is tender to palpation around that area.  As for her respiratory status, she appears to be stable.  I had a discussion with the patient and she is agreeable to lidocaine or cetacaine for her throat only.  I want to minimize any risk of respiratory compromise and she understands.  Additionally, I will use a pediatric endoscope for the evaluation.  Plan: 1) EGD without sedation and only topical anesthesia.  Emmitt Matthews D 05/19/2012, 8:38 AM

## 2012-05-19 NOTE — Progress Notes (Signed)
Patient back from EGD.  No intra-abdominal abnormalities.  This is likely an infection / abscess in the abdominal wall.  Will re-run it by [facility] now that EGD has been completed to determine if they can offer and recommendations.  Algis Downs, PA-C [facility] Pager: [phone]

## 2012-05-19 NOTE — Progress Notes (Signed)
ANTIBIOTIC CONSULT NOTE - INITIAL  Pharmacy Consult for Vancomycin Indication: cellulitis  Allergies  Allergen Reactions  . Codeine Rash    Patient Measurements: Height: 5\' 3"  (160 cm) Weight: 127 lb 1.6 oz (57.652 kg) IBW/kg (Calculated) : 52.4  Vital Signs: Temp: 97.5 F (36.4 C) (05/16 0628) Temp src: Oral (05/16 0628) BP: 99/67 mmHg (05/16 0628) Pulse Rate: 77 (05/16 0628)  Labs:  Recent Labs  05/18/12 2140  WBC 8.4  HGB 13.2  PLT 209  CREATININE 1.00   Estimated Creatinine Clearance: 54.4 ml/min (by C-G formula based on Cr of 1). No results found for this basename: VANCOTROUGH, VANCOPEAK, VANCORANDOM, GENTTROUGH, GENTPEAK, GENTRANDOM, TOBRATROUGH, TOBRAPEAK, TOBRARND, AMIKACINPEAK, AMIKACINTROU, AMIKACIN,  in the last 72 hours   Microbiology: No results found for this or any previous visit (from the past 720 hour(s)).  Medical History: Past Medical History  Diagnosis Date  . [name]     Medications:  Prescriptions prior to admission  Medication Sig Dispense Refill  . buPROPion (WELLBUTRIN XL) 300 MG 24 hr tablet Take 300 mg by mouth daily.      . clonazePAM (KLONOPIN) 1 MG tablet Take 1 mg by mouth 2 (two) times daily as needed for anxiety.      Marland Kitchen estradiol (ESTRACE) 0.5 MG tablet Take 0.5 mg by mouth daily.      . SUMAtriptan (IMITREX) 100 MG tablet Take 100 mg by mouth as needed for migraine.      . baclofen (LIORESAL) 10 MG tablet Take 10 mg by mouth 2 (two) times daily.        Marland Kitchen desvenlafaxine (PRISTIQ) 100 MG 24 hr tablet Take 100 mg by mouth daily.        . diazepam (VALIUM) 5 MG tablet Take 5 mg by mouth 2 (two) times daily.        . methadone (DOLOPHINE) 5 MG tablet Take 15 mg by mouth 2 (two) times daily.        . sodium chloride 0.9 % SOLN 100 mL with morphine 50 MG/ML SOLN 5 mg/mL Inject 5 mg/hr into the vein continuous.        Marland Kitchen tiZANidine (ZANAFLEX) 4 MG tablet Take 4 mg by mouth every morning.        Marland Kitchen tiZANidine (ZANAFLEX) 4 MG  tablet Take 8 mg by mouth at bedtime.        . TRAZODONE HCL PO Take 1 tablet by mouth at bedtime.         Assessment: 52 yo female with abdominal wall infection for empiric antibiotics.  Vancomycin 1 g IV given in ED at 0400  Goal of Therapy:  Vancomycin trough level 10-15 mcg/ml  Plan:  Vancomycin 1 g IV q12h  Eddie Candle 05/19/2012,6:42 AM

## 2012-05-19 NOTE — Op Note (Signed)
[facility] [address] [place], [address]   OPERATIVE PROCEDURE REPORT  PATIENT: Kayla [name], Kayla [name]  MR#: [id number] BIRTHDATE: 02-May-1960  GENDER: Female ENDOSCOPIST: Jeani Hawking, MD ASSISTANT:   Beryle Beams, technician and Ennis Forts, RN PROCEDURE DATE: 05/19/2012 PROCEDURE:   EGD, diagnostic ASA CLASS:   Class III INDICATIONS:abnormal CT of the GI tract. MEDICATIONS: TOPICAL ANESTHETIC:   Viscous Xylocaine  DESCRIPTION OF PROCEDURE:   After the risks benefits and alternatives of the procedure were thoroughly explained, informed consent was obtained.  The Pentax Gastroscope Peds [id number] endoscope was introduced through the mouth  and advanced to the second portion of the duodenum Without limitations.      The instrument was slowly withdrawn as the mucosa was fully examined.    FINDINGS: The upper, middle and distal third of the esophagus were carefully inspected and no abnormalities were noted.  The z-line was well seen at the GEJ.  The endoscope was pushed into the fundus which was normal including a retroflexed view.  The antrum, gastric body, first and second part of the duodenum were unremarkable. The old PEG site was identified in the distal gastric body along the lesser curvature.  There was no overt abnormalities identified luminally.  Retroflexed views revealed no abnormalities.     The scope was then withdrawn from the patient and the procedure terminated.  COMPLICATIONS: There were no complications.  IMPRESSION: 1) Normal EGD - Old PEG site identified without any overt abnromalities.  RECOMMENDATIONS: 1) ? Surgical drainage versus antibiotic treatment.   _______________________________ eSigned:  Jeani Hawking, MD 05/19/2012 2:11 PM

## 2012-05-19 NOTE — H&P (Signed)
[facility] History and Physical  Horace Wishon XBJ:[id number] DOB: [birth year]    PCP:   None  Chief Complaint: abdominal pain and green discharge from the previous peg tube site.  HPI: Kayla [name] is an 52 y.o. female previously diagnosed with [name] on was previously under hospice care, but improved markedly, so that diagnosis was questioned, hx of prior peg tube placement and removal many years ago, hx of CCY, appendectomy, hysterectomy, and had intermittent abdominal pain at the peg site before with spontaneous resolution, presents to [facility] with similar pain, but this time, more severe, and there was greenish discharge from her old peg tube site.  She also had some diarrhea, but never was diagnosed with crohn's or other IBD.  She had some chills, and nausea, but no vomiting.  She denied any chest pain but had some shortness of breath.  She said her diaphrams are working at 50% capability and that if general anesthesia was to be used, there are certain kind that cannot be used as it triggers her paralysis.  She couldn't tell exactly what they were.  Evaluation in the ER included a normal WBC, normal HB, normal LFTs, and normal renal fx tests.  Her lipase is 20.  An abdominal pelvic CT was done showing a track from the abdominal wall to the anterior gastric wall along with prominence of the intra and extra biliary ducts and pancreatic ductal prominence.  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  Surgery was consulted, but felt that perhaps GI could help with endoscopy.  Rewiew of Systems:  Constitutional: Negative for malaise.  No significant weight loss or weight gain Eyes: Negative for eye pain, redness and discharge, diplopia, visual changes, or flashes of light. ENMT: Negative for ear pain, hoarseness, nasal congestion, sinus pressure and sore throat. No headaches; tinnitus, drooling, or  problem swallowing. Cardiovascular: Negative for chest pain, palpitations, diaphoresis, dyspnea and peripheral edema. ; No orthopnea, PND Respiratory: Negative for cough, hemoptysis, wheezing and stridor. No pleuritic chestpain. Gastrointestinal: Negative for diarrhea, constipation, melena, blood in stool, hematemesis, jaundice and rectal bleeding.    Genitourinary: Negative for frequency, dysuria, incontinence,flank pain and hematuria; Musculoskeletal: Negative for back pain and neck pain. Negative for swelling and trauma.;  Skin: . Negative for pruritus, rash, abrasions, bruising and skin lesion.; ulcerations Neuro: Negative for headache, lightheadedness and neck stiffness. Negative for weakness, altered level of consciousness , altered mental status, extremity weakness, burning feet, involuntary movement, seizure and syncope.  Psych: negative for anxiety, depression, insomnia, tearfulness, panic attacks, hallucinations, paranoia, suicidal or homicidal ideation    Past Medical History  Diagnosis Date  . [name]     Past Surgical History  Procedure Laterality Date  . Abdominal hysterectomy    . Cholecystectomy    . Appendectomy    . Peg placement    . Neck surgery      Medications:  HOME MEDS: Prior to Admission medications   Medication Sig Start Date End Date Taking? Authorizing Provider  buPROPion (WELLBUTRIN XL) 300 MG 24 hr tablet Take 300 mg by mouth daily.   Yes Historical Provider, MD  clonazePAM (KLONOPIN) 1 MG tablet Take 1 mg by mouth 2 (two) times daily as needed for anxiety.   Yes Historical Provider, MD  estradiol (ESTRACE) 0.5 MG tablet Take 0.5 mg by mouth daily.   Yes Historical Provider, MD  SUMAtriptan (IMITREX) 100 MG tablet Take  100 mg by mouth as needed for migraine.   Yes Historical Provider, MD  baclofen (LIORESAL) 10 MG tablet Take 10 mg by mouth 2 (two) times daily.      Historical Provider, MD  desvenlafaxine (PRISTIQ) 100 MG 24 hr tablet Take  100 mg by mouth daily.      Historical Provider, MD  diazepam (VALIUM) 5 MG tablet Take 5 mg by mouth 2 (two) times daily.      Historical Provider, MD  methadone (DOLOPHINE) 5 MG tablet Take 15 mg by mouth 2 (two) times daily.      Historical Provider, MD  sodium chloride 0.9 % SOLN 100 mL with morphine 50 MG/ML SOLN 5 mg/mL Inject 5 mg/hr into the vein continuous.      Historical Provider, MD  tiZANidine (ZANAFLEX) 4 MG tablet Take 4 mg by mouth every morning.      Historical Provider, MD  tiZANidine (ZANAFLEX) 4 MG tablet Take 8 mg by mouth at bedtime.      Historical Provider, MD  TRAZODONE HCL PO Take 1 tablet by mouth at bedtime.      Historical Provider, MD     Allergies:  Allergies  Allergen Reactions  . Codeine Rash    Social History:   reports that she has been smoking.  She does not have any smokeless tobacco history on file. She reports that she does not drink alcohol or use illicit drugs.  Family History: No family history on file.   Physical Exam: Filed Vitals:   05/19/12 0122 05/19/12 0228 05/19/12 0253 05/19/12 0413  BP: 84/40 96/33 98/43  90/32  Pulse:  72 73 71  Temp:      TempSrc:      Resp:  16 16 16   Height:      Weight:      SpO2:  98% 100% 100%   Blood pressure 90/32, pulse 71, temperature 98.7 F (37.1 C), temperature source Oral, resp. rate 16, height 5\' 3"  (1.6 m), weight 53.524 kg (118 lb), SpO2 100.00%.  GEN:  Anxious patient lying in the stretcher in no acute distress; cooperative with exam. PSYCH:  alert and oriented x4; does not appear anxious or depressed; affect is appropriate. HEENT: Mucous membranes pink and anicteric; PERRLA; EOM intact; no cervical lymphadenopathy nor thyromegaly or carotid bruit; no JVD; There were no stridor. Neck is very supple. Breasts:: Not examined CHEST WALL: No tenderness CHEST: Normal respiration, clear to auscultation bilaterally.  HEART: Regular rate and rhythm.  There are no murmur, rub, or gallops.   BACK:  No kyphosis or scoliosis; no CVA tenderness ABDOMEN: soft and tender at the previous peg site. No masses, no organomegaly, normal abdominal bowel sounds; no pannus; no intertriginous candida. There is no rebound and no distention. Rectal Exam: Not done EXTREMITIES: No bone or joint deformity; age-appropriate arthropathy of the hands and knees; no edema; no ulcerations.  There is no calf tenderness. Genitalia: not examined PULSES: 2+ and symmetric SKIN: Normal hydration no rash or ulceration CNS: Cranial nerves 2-12 grossly intact no focal lateralizing neurologic deficit.  Speech is fluent; uvula elevated with phonation, facial symmetry and tongue midline. DTR are normal bilaterally, cerebella exam is intact, barbinski is negative and strengths are equaled bilaterally.  No sensory loss.   Labs on Admission:  Basic Metabolic Panel:  Recent Labs Lab 05/18/12 2140  NA 140  K 3.6  CL 102  CO2 28  GLUCOSE 88  BUN 7  CREATININE 1.00  CALCIUM 9.4  Liver Function Tests:  Recent Labs Lab 05/18/12 2140  AST 16  ALT 12  ALKPHOS 57  BILITOT 0.2*  PROT 6.9  ALBUMIN 3.7    Recent Labs Lab 05/18/12 2140  LIPASE 20   No results found for this basename: AMMONIA,  in the last 168 hours CBC:  Recent Labs Lab 05/18/12 2140  WBC 8.4  NEUTROABS 4.8  HGB 13.2  HCT 38.4  MCV 88.3  PLT 209   Cardiac Enzymes: No results found for this basename: CKTOTAL, CKMB, CKMBINDEX, TROPONINI,  in the last 168 hours  CBG: No results found for this basename: GLUCAP,  in the last 168 hours   Radiological Exams on Admission: Ct Abdomen Pelvis W Contrast  05/19/2012   *RADIOLOGY REPORT*  Clinical Data: Abdominal pain  CT ABDOMEN AND PELVIS WITH CONTRAST  Technique:  Multidetector CT imaging of the abdomen and pelvis was performed following the standard protocol during bolus administration of intravenous contrast.  Contrast: 50mL OMNIPAQUE IOHEXOL 300 MG/ML  SOLN, OMNIPAQUE IOHEXOL 300  MG/ML  SOLN  Comparison: None.  Findings: Mild dependent opacity right lower lobe, favored to reflect atelectasis or scarring.  Heart size within normal limits.  Cyst within the left hepatic lobe. Cholecystectomy.  Mild intra and extrahepatic biliary ductal prominence to the level of the ampulla. No obstructing lesion visualized.  There is mild pancreatic ductal prominence is well, measuring 3 mm. No solid or infiltrative pancreatic mass visualized.  Unremarkable adrenal glands. Unremarkable spleen.  Symmetric renal enhancement.  No hydronephrosis or hydroureter.  No CT evidence for colitis.  Appendix not identified.  No right lower quadrant inflammation.  There is a fluid tract extending from the anterior abdomen skin surface to the anterior margin of the stomach as seen on images 27 and 28.  Presumably this corresponds to the course of a prior gastrostomy tube however correlate clinically.  Small bowel loops are normal course and caliber.  No free intraperitoneal air or fluid.  No lymphadenopathy.  There is scattered atherosclerotic calcification of the aorta and its branches. No aneurysmal dilatation.  Thin-walled bladder.  Absent uterus.  No adnexal mass.  Lumbosacral facet arthropathy.  No acute osseous finding.  IMPRESSION: Mild intra and extrahepatic biliary ductal prominence and mild main pancreatic prominence, both to the level of the ampulla.  Recommend LFT and ERCP correlation.  No obstructing lesion visualized by CT.  There is a fluid tract extending from the skin surface to the anterior gastric wall.  Presumably this corresponds to the site of a prior gastrostomy tube.  Correlate clinically and with direct inspection of the skin site as a patent communication or infection is not excluded.   Original Report Authenticated By: Jearld Lesch, M.D.    Assessment/Plan Present on Admission:  . 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 Biliary tree dilatation. Transient  hypotension.  PLAN:  Will admit her for possible gastric-cutaneous fistula vs abdominal wall infection.  She will get Van/Zosyn IV.  Please consult GI to see if she can get upper endoscopy.  I will make her NPO.  Will give IV morphine and continue her home meds.  She specifically reiterated that if general anesthesia is to be used, to be prudent as she has had hx of neuromuscular disease and to avoid certain meds (? Paralytic agents) though she can't tell exactly which ones.  She is stable, full code, and will be admitted to [facility] service.  Thank you for allowing me to partake in the  care of your nice patient.  Other plans as per orders.  Code Status: FULL Unk Lightning, MD. [facility] Pager [phone] 7pm to 7am.  05/19/2012, 6:29 AM

## 2012-05-19 NOTE — Progress Notes (Signed)
Rec'd in RR.  No sedation given. Patient awake. C/o abdominal pain.  MS 2 mg IV given.  Resting at present.

## 2012-05-19 NOTE — Progress Notes (Signed)
UR COMPLETED  

## 2012-05-19 NOTE — ED Notes (Signed)
MD at bedside discussing test results and dispo plan. 

## 2012-05-19 NOTE — ED Notes (Signed)
MD notified of pts drop in bp. Order received for NS bolus. MD notified of pts request for pain med.  No pain med ordered at this time due to bp.  Pt and family made aware. Pt resting quietly on side at this time. Room darkened.

## 2012-05-19 NOTE — Consult Note (Signed)
No evidence of GC fistula.  OK to D/C home from our standpoint.  I D/W Dr. Arthor Captain. I also spoke to her family. Patient examined and I agree with the assessment and plan  Violeta Gelinas, MD, MPH, FACS Pager: [phone]  05/19/2012 5:19 PM

## 2012-05-20 DIAGNOSIS — L089 Local infection of the skin and subcutaneous tissue, unspecified: Secondary | ICD-10-CM

## 2012-05-20 DIAGNOSIS — G1221 Amyotrophic lateral sclerosis: Secondary | ICD-10-CM

## 2012-05-20 DIAGNOSIS — L02219 Cutaneous abscess of trunk, unspecified: Principal | ICD-10-CM

## 2012-05-20 DIAGNOSIS — L03319 Cellulitis of trunk, unspecified: Principal | ICD-10-CM

## 2012-05-20 LAB — COMPREHENSIVE METABOLIC PANEL
ALT: 10 U/L (ref 0–35)
AST: 15 U/L (ref 0–37)
Albumin: 2.9 g/dL — ABNORMAL LOW (ref 3.5–5.2)
CO2: 26 mEq/L (ref 19–32)
Chloride: 108 mEq/L (ref 96–112)
GFR calc non Af Amer: 61 mL/min — ABNORMAL LOW (ref >90)
Sodium: 142 mEq/L (ref 135–145)
Total Bilirubin: 0.3 mg/dL (ref 0.3–1.2)

## 2012-05-20 LAB — CBC
Platelets: 171 10*3/uL (ref 150–400)
RBC: 3.81 MIL/uL — ABNORMAL LOW (ref 3.87–5.11)
RDW: 12.4 % (ref 11.5–15.5)
WBC: 5.6 10*3/uL (ref 4.0–10.5)

## 2012-05-20 MED ORDER — OXYCODONE-ACETAMINOPHEN 5-325 MG PO TABS: 1.0000 | ORAL_TABLET | ORAL | Status: DC | PRN

## 2012-05-20 MED ORDER — DOXYCYCLINE HYCLATE 100 MG PO TABS: 100.0000 mg | ORAL_TABLET | Freq: Two times a day (BID) | ORAL | Status: DC

## 2012-05-20 NOTE — Discharge Summary (Signed)
Physician Discharge Summary  Kayla [name] WUJ:[id number] DOB: 1960-03-06 DOA: 05/18/2012  PCP: No primary provider on file.  Admit date: 05/18/2012 Discharge date: 05/20/2012  Time spent: 40 minutes  Recommendations for Outpatient Follow-up:  1. Followup with primary care physician in one week  Discharge Diagnoses:  Principal Problem:   Abdominal wall cellulitis Active Problems:   Cutaneous fistula   Hypotension   Abdominal pain   Doreatha Martin disease   Discharge Condition:  stable  Diet recommendation: Regular  Filed Weights   05/18/12 2156 05/19/12 0628  Weight: 53.524 kg (118 lb) 57.652 kg (127 lb 1.6 oz)    History of present illness:  Kayla [name] is an 52 y.o. female previously diagnosed with [name] on was previously under hospice care, but improved markedly, so that diagnosis was questioned, hx of prior peg tube placement and removal many years ago, hx of CCY, appendectomy, hysterectomy, and had intermittent abdominal pain at the peg site before with spontaneous resolution, presents to [facility] with similar pain, but this time, more severe, and there was greenish discharge from her old tube side. She also had some diarrhea, but never was diagnosed with crohn's or other IBD. She had some chills, and nausea, but no vomiting. She denied any chest pain but had some shortness of breath. She said her diaphrams are working at 50% capability and that if general anesthesia was to be used, there are certain kind that cannot be used as it triggers her paralysis. She couldn't tell exactly what they were. Evaluation in the ER included a normal WBC, normal HB, normal LFTs, and normal renal fx tests. Her lipase is 20. An abdominal pelvic CT was done showing a track from the abdominal wall to the anterior gastric wall along with prominence of the intra and extra biliary ducts and pancreatic ductal prominence. She was placed on Van/Zosyn, and hospitalist was asked to admit her for possible  gastro-cutaneous fistula vs abdominal wall abcess. Surgery was consulted, but felt that perhaps GI could help with endoscopy.  Hospital Course:   1. Abdominal wall cellulitis: Patient had some serous discharge from her old PEG tube site. Patient did have old PEG tube and was removed 4 years ago. It appears that she had small scar, which recently started to drain some serous discharge. Patient was seen by gastroenterology and EGD was done which showed no evidence of gastrocutaneous fistula. Patient seen by general surgery, CT scan of abdomen pelvis and stroke her examination did not show any abscess formation. Discussion with general surgery reveal that patient probably with chronic PEG tube she developed some epithelialization of the tube tract, and patient will have some seromucoid discharge from that every now and then especially the patient has cellulitis but she has right now. Cellulitis was treated with vancomycin and Zosyn on admission because we thought initially secondary to intra-abdominal infection. As mentioned above CT scan showed no evidence of intra-abdominal infection, patient discharged on doxycycline for 10 days.  2. Abdominal pain: Secondary to abdominal cellulitis, patient was getting narcotics for abdominal pain, she was discharged on Percocet.  3. Doreatha Martin disease: Per patient she was bed confined, had PEG tube and suprapubic catheter and she was even admitted to residential hospice for comfort care. Patient said she walked out of the hospice home and she's walking since then. Stable and no changes were done.    Procedures:   EGD done on 05/19/2012 by Dr. Marcelle Overlie showed normal EGD with the old PEG site identified without  any overt abnormalities.  Consultations:  Dr. Elnoria Howard of gastroenterology.  Dr. [name] of general surgery  Discharge Exam: Filed Vitals:   05/19/12 1910 05/19/12 2117 05/20/12 0532 05/20/12 0640  BP: 96/63 92/58 82/52  95/62  Pulse:  87 63   Temp:   98.4 F (36.9 C) 98.6 F (37 C)   TempSrc:   Oral   Resp:  19 16   Height:      Weight:      SpO2:  96% 98%   General: Alert and awake, oriented x3, not in any acute distress. HEENT: anicteric sclera, pupils reactive to light and accommodation, EOMI CVS: S1-S2 clear, no murmur rubs or gallops Chest: clear to auscultation bilaterally, no wheezing, rales or rhonchi Abdomen: soft nontender, nondistended, normal bowel sounds, no organomegaly Extremities: no cyanosis, clubbing or edema noted bilaterally Neuro: Cranial nerves II-XII intact, no focal neurological deficits  Discharge Instructions     Medication List    TAKE these medications       ACIDOPHILUS PO  Take 1 tablet by mouth daily.     beta carotene w/minerals tablet  Take 1 tablet by mouth daily.     buPROPion 300 MG 24 hr tablet  Commonly known as:  WELLBUTRIN XL  Take 300 mg by mouth daily.     clonazePAM 1 MG tablet  Commonly known as:  KLONOPIN  Take 1 mg by mouth 2 (two) times daily as needed for anxiety.     doxycycline 100 MG tablet  Commonly known as:  VIBRA-TABS  Take 1 tablet (100 mg total) by mouth 2 (two) times daily.     estradiol 0.5 MG tablet  Commonly known as:  ESTRACE  Take 0.5 mg by mouth daily.     Magnesium 250 MG Tabs  Take 1 tablet by mouth daily.     multivitamin with minerals Tabs  Take 1 tablet by mouth daily.     oxyCODONE-acetaminophen 5-325 MG per tablet  Commonly known as:  ROXICET  Take 1 tablet by mouth every 4 (four) hours as needed for pain.     PRESCRIPTION MEDICATION  Inhale 2 puffs into the lungs every 6 (six) hours as needed (Inhaler as needed for shortness of breath).     SUMAtriptan 100 MG tablet  Commonly known as:  IMITREX  Take 100 mg by mouth as needed for migraine.     tiZANidine 4 MG capsule  Commonly known as:  ZANAFLEX  Take 4 mg by mouth 2 (two) times daily.     traZODone 100 MG tablet  Commonly known as:  DESYREL  Take 200 mg by mouth at  bedtime.       Allergies  Allergen Reactions  . Dilaudid (Hydromorphone Hcl) Itching  . Codeine Rash       Follow-up Information   Follow up with Caffie Damme, MD In 1 week.   Contact information:   [address] [place] [address] [phone]        The results of significant diagnostics from this hospitalization (including imaging, microbiology, ancillary and laboratory) are listed below for reference.    Significant Diagnostic Studies: Ct Abdomen Pelvis W Contrast  05/19/2012   *RADIOLOGY REPORT*  Clinical Data: Abdominal pain  CT ABDOMEN AND PELVIS WITH CONTRAST  Technique:  Multidetector CT imaging of the abdomen and pelvis was performed following the standard protocol during bolus administration of intravenous contrast.  Contrast: 50mL OMNIPAQUE IOHEXOL 300 MG/ML  SOLN, OMNIPAQUE IOHEXOL 300 MG/ML  SOLN  Comparison: None.  Findings: Mild dependent opacity right lower lobe, favored to reflect atelectasis or scarring.  Heart size within normal limits.  Cyst within the left hepatic lobe. Cholecystectomy.  Mild intra and extrahepatic biliary ductal prominence to the level of the ampulla. No obstructing lesion visualized.  There is mild pancreatic ductal prominence is well, measuring 3 mm. No solid or infiltrative pancreatic mass visualized.  Unremarkable adrenal glands. Unremarkable spleen.  Symmetric renal enhancement.  No hydronephrosis or hydroureter.  No CT evidence for colitis.  Appendix not identified.  No right lower quadrant inflammation.  There is a fluid tract extending from the anterior abdomen skin surface to the anterior margin of the stomach as seen on images 27 and 28.  Presumably this corresponds to the course of a prior gastrostomy tube however correlate clinically.  Small bowel loops are normal course and caliber.  No free intraperitoneal air or fluid.  No lymphadenopathy.  There is scattered atherosclerotic calcification of the aorta and its branches. No  aneurysmal dilatation.  Thin-walled bladder.  Absent uterus.  No adnexal mass.  Lumbosacral facet arthropathy.  No acute osseous finding.  IMPRESSION: Mild intra and extrahepatic biliary ductal prominence and mild main pancreatic prominence, both to the level of the ampulla.  Recommend LFT and ERCP correlation.  No obstructing lesion visualized by CT.  There is a fluid tract extending from the skin surface to the anterior gastric wall.  Presumably this corresponds to the site of a prior gastrostomy tube.  Correlate clinically and with direct inspection of the skin site as a patent communication or infection is not excluded.   Original Report Authenticated By: Jearld Lesch, M.D.    Microbiology: No results found for this or any previous visit (from the past 240 hour(s)).   Labs: Basic Metabolic Panel:  Recent Labs Lab 05/18/12 2140 05/19/12 0756 05/20/12 0630  NA 140  --  142  K 3.6  --  3.7  CL 102  --  108  CO2 28  --  26  GLUCOSE 88  --  99  BUN 7  --  5*  CREATININE 1.00 0.97 1.03  CALCIUM 9.4  --  8.5   Liver Function Tests:  Recent Labs Lab 05/18/12 2140 05/20/12 0630  AST 16 15  ALT 12 10  ALKPHOS 57 52  BILITOT 0.2* 0.3  PROT 6.9 5.9*  ALBUMIN 3.7 2.9*    Recent Labs Lab 05/18/12 2140  LIPASE 20   No results found for this basename: AMMONIA,  in the last 168 hours CBC:  Recent Labs Lab 05/18/12 2140 05/19/12 0756 05/20/12 0630  WBC 8.4 7.6 5.6  NEUTROABS 4.8  --   --   HGB 13.2 11.3* 11.1*  HCT 38.4 33.2* 33.5*  MCV 88.3 87.6 87.9  PLT 209 195 171   Cardiac Enzymes: No results found for this basename: CKTOTAL, CKMB, CKMBINDEX, TROPONINI,  in the last 168 hours BNP: BNP (last 3 results) No results found for this basename: PROBNP,  in the last 8760 hours CBG:  Recent Labs Lab 05/19/12 0925  GLUCAP 82       Signed:  Azadeh Hyder A  [facility] 05/20/2012, 11:08 AM

## 2012-05-20 NOTE — Progress Notes (Signed)
Kayla [name] to be D/C'd Home per MD order.  Discussed with the patient and all questions fully answered.    Medication List    TAKE these medications       ACIDOPHILUS PO  Take 1 tablet by mouth daily.     beta carotene w/minerals tablet  Take 1 tablet by mouth daily.     buPROPion 300 MG 24 hr tablet  Commonly known as:  WELLBUTRIN XL  Take 300 mg by mouth daily.     clonazePAM 1 MG tablet  Commonly known as:  KLONOPIN  Take 1 mg by mouth 2 (two) times daily as needed for anxiety.     doxycycline 100 MG tablet  Commonly known as:  VIBRA-TABS  Take 1 tablet (100 mg total) by mouth 2 (two) times daily.     estradiol 0.5 MG tablet  Commonly known as:  ESTRACE  Take 0.5 mg by mouth daily.     Magnesium 250 MG Tabs  Take 1 tablet by mouth daily.     multivitamin with minerals Tabs  Take 1 tablet by mouth daily.     oxyCODONE-acetaminophen 5-325 MG per tablet  Commonly known as:  ROXICET  Take 1 tablet by mouth every 4 (four) hours as needed for pain.     PRESCRIPTION MEDICATION  Inhale 2 puffs into the lungs every 6 (six) hours as needed (Inhaler as needed for shortness of breath).     SUMAtriptan 100 MG tablet  Commonly known as:  IMITREX  Take 100 mg by mouth as needed for migraine.     tiZANidine 4 MG capsule  Commonly known as:  ZANAFLEX  Take 4 mg by mouth 2 (two) times daily.     traZODone 100 MG tablet  Commonly known as:  DESYREL  Take 200 mg by mouth at bedtime.        VVS, Skin clean, dry and intact without evidence of skin break down, no evidence of skin tears noted. IV catheter discontinued intact. Site without signs and symptoms of complications. Dressing and pressure applied.  An After Visit Summary was printed and given to the patient. Patient escorted via WC, and D/C home via private auto.  Kennyth Arnold D 05/20/2012 11:01 AM

## 2012-05-25 LAB — CULTURE, BLOOD (ROUTINE X 2)

## 2012-08-08 ENCOUNTER — Emergency Department (HOSPITAL_BASED_OUTPATIENT_CLINIC_OR_DEPARTMENT_OTHER)
Admission: EM | Admit: 2012-08-08 | Discharge: 2012-08-08 | Disposition: A | Discharge status: Home / Self Care | Payer: Medicare Other | Attending: Emergency Medicine | Admitting: Emergency Medicine

## 2012-08-08 ENCOUNTER — Emergency Department (HOSPITAL_BASED_OUTPATIENT_CLINIC_OR_DEPARTMENT_OTHER): Payer: Medicare Other

## 2012-08-08 ENCOUNTER — Encounter (HOSPITAL_BASED_OUTPATIENT_CLINIC_OR_DEPARTMENT_OTHER): Payer: Self-pay | Admitting: *Deleted

## 2012-08-08 DIAGNOSIS — Z981 Arthrodesis status: Secondary | ICD-10-CM | POA: Insufficient documentation

## 2012-08-08 DIAGNOSIS — R29818 Other symptoms and signs involving the nervous system: Secondary | ICD-10-CM | POA: Insufficient documentation

## 2012-08-08 DIAGNOSIS — M4802 Spinal stenosis, cervical region: Secondary | ICD-10-CM | POA: Insufficient documentation

## 2012-08-08 DIAGNOSIS — R51 Headache: Secondary | ICD-10-CM | POA: Insufficient documentation

## 2012-08-08 DIAGNOSIS — R209 Unspecified disturbances of skin sensation: Secondary | ICD-10-CM | POA: Insufficient documentation

## 2012-08-08 DIAGNOSIS — M542 Cervicalgia: Secondary | ICD-10-CM

## 2012-08-08 DIAGNOSIS — G43909 Migraine, unspecified, not intractable, without status migrainosus: Secondary | ICD-10-CM

## 2012-08-08 MED ORDER — KETOROLAC TROMETHAMINE 30 MG/ML IJ SOLN
INTRAMUSCULAR | Status: AC
  Filled 2012-08-08: qty 1

## 2012-08-08 MED ORDER — LORAZEPAM 2 MG/ML IJ SOLN
2.0000 mg | Freq: Once | INTRAMUSCULAR | Status: AC
  Administered 2012-08-08: 2 mg via INTRAVENOUS
  Filled 2012-08-08: qty 1

## 2012-08-08 MED ORDER — MORPHINE SULFATE 4 MG/ML IJ SOLN
4.0000 mg | Freq: Once | INTRAMUSCULAR | Status: AC
  Administered 2012-08-08: 4 mg via INTRAVENOUS
  Filled 2012-08-08: qty 1

## 2012-08-08 MED ORDER — DIPHENHYDRAMINE HCL 50 MG/ML IJ SOLN
25.0000 mg | Freq: Once | INTRAMUSCULAR | Status: AC
  Administered 2012-08-08: 25 mg via INTRAVENOUS
  Filled 2012-08-08: qty 1

## 2012-08-08 MED ORDER — PREDNISONE 50 MG PO TABS: 50.0000 mg | ORAL_TABLET | Freq: Every day | ORAL | Status: DC

## 2012-08-08 MED ORDER — METHOCARBAMOL 500 MG PO TABS: 500.0000 mg | ORAL_TABLET | Freq: Two times a day (BID) | ORAL | Status: DC

## 2012-08-08 MED ORDER — METOCLOPRAMIDE HCL 5 MG/ML IJ SOLN
INTRAMUSCULAR | Status: AC
  Filled 2012-08-08: qty 2

## 2012-08-08 MED ORDER — METOCLOPRAMIDE HCL 5 MG/ML IJ SOLN
10.0000 mg | Freq: Once | INTRAMUSCULAR | Status: AC
  Administered 2012-08-08: 10 mg via INTRAVENOUS

## 2012-08-08 MED ORDER — KETOROLAC TROMETHAMINE 30 MG/ML IJ SOLN
30.0000 mg | Freq: Once | INTRAMUSCULAR | Status: AC
  Administered 2012-08-08: 30 mg via INTRAVENOUS

## 2012-08-08 MED ORDER — DEXAMETHASONE SODIUM PHOSPHATE 10 MG/ML IJ SOLN
10.0000 mg | Freq: Once | INTRAMUSCULAR | Status: AC
  Administered 2012-08-08: 10 mg via INTRAVENOUS
  Filled 2012-08-08: qty 1

## 2012-08-08 MED ORDER — SODIUM CHLORIDE 0.9 % IV BOLUS (SEPSIS)
1000.0000 mL | Freq: Once | INTRAVENOUS | Status: AC
  Administered 2012-08-08: 1000 mL via INTRAVENOUS

## 2012-08-08 MED ORDER — HYDROCODONE-ACETAMINOPHEN 5-325 MG PO TABS: 1.0000 | ORAL_TABLET | Freq: Four times a day (QID) | ORAL | Status: DC | PRN

## 2012-08-08 MED ORDER — ONDANSETRON 8 MG PO TBDP: 8.0000 mg | ORAL_TABLET | Freq: Three times a day (TID) | ORAL | Status: DC | PRN

## 2012-08-08 NOTE — ED Notes (Signed)
Neck pain. States the neck pain is causing her head to hurt. Hx of neck fusion 12 years ago.

## 2012-08-08 NOTE — ED Notes (Signed)
Dr Towanda Malkin returns to bedside to discuss results of MRI and follow up for second time. Family member at bedside was not present during first explanation by MD. Both patient and family member verbalize understanding of diagnosis and plan for treatment and follow up with dr Katrinka Blazing who dr Towanda Malkin has spoken with and will refer her to neurology and neurosurgery .

## 2012-08-08 NOTE — ED Notes (Signed)
Family member with patient out to the desk to ask how much longer before patient will be seen . Advised family member it shouldn't be much longer.

## 2012-08-08 NOTE — ED Provider Notes (Signed)
CSN: [id number]     Arrival date & time 08/08/12  1055 History     First MD Initiated Contact with Patient 08/08/12 1105     Chief Complaint  Patient presents with  . Headache  . Neck Pain   (Consider location/radiation/quality/duration/timing/severity/associated sxs/prior Treatment) HPI Comments: Pt comes in with cc neck pain, migraine pain. Pt has complicated neurologic hx. And is s/p cervical fusion surgery. She reports that over the past few days her symptoms have gotten worse. She has a posterior headache, and neck pain. The neck pain is worse than the headaches. The headache is similar to her migraine, and responding to sumatriptan - just returns spontaneously. She also has left sided weakness and some paresthesias, not new, but getting worse. Pt reports that she has not seen a neurologist or Neurosurgeon recently, and that those team had ordered an MRI. She is taking ibuprofen at this time for her pain. associated numbness, weakness, urinary incontinence, urinary retention, bowel incontinence, weakness.    Patient is a 52 y.o. female presenting with headaches and neck pain. The history is provided by the patient.  Headache Associated symptoms: neck pain and numbness   Associated symptoms: no abdominal pain, no nausea and no vomiting   Neck Pain Associated symptoms: headaches, numbness and weakness   Associated symptoms: no chest pain     Past Medical History  Diagnosis Date  . [name]    Past Surgical History  Procedure Laterality Date  . Abdominal hysterectomy    . Cholecystectomy    . Appendectomy    . Peg placement    . Neck surgery    . Esophagogastroduodenoscopy N/A 05/19/2012    Procedure: ESOPHAGOGASTRODUODENOSCOPY (EGD);  Surgeon: Theda Belfast, MD;  Location: [facility] END[facility];  Service: Endoscopy;  Laterality: N/A;   No family history on file. History  Substance Use Topics  . Smoking status: Current Every Day Smoker -- 0.50 packs/day  . Smokeless  tobacco: Not on file  . Alcohol Use: No   OB History   Grav Para Term Preterm Abortions TAB SAB Ect Mult Living                 Review of Systems  HENT: Positive for neck pain.   Respiratory: Negative for shortness of breath.   Cardiovascular: Negative for chest pain.  Gastrointestinal: Negative for nausea, vomiting and abdominal pain.  Genitourinary: Negative for dysuria.  Neurological: Positive for weakness, numbness and headaches.    Allergies  Dilaudid and Codeine  Home Medications   Current Outpatient Rx  Name  Route  Sig  Dispense  Refill  . beta carotene w/minerals (OCUVITE) tablet   Oral   Take 1 tablet by mouth daily.         Marland Kitchen buPROPion (WELLBUTRIN XL) 300 MG 24 hr tablet   Oral   Take 300 mg by mouth daily.         . clonazePAM (KLONOPIN) 1 MG tablet   Oral   Take 1 mg by mouth 2 (two) times daily as needed for anxiety.         Marland Kitchen doxycycline (VIBRA-TABS) 100 MG tablet   Oral   Take 1 tablet (100 mg total) by mouth 2 (two) times daily.   20 tablet   0   . estradiol (ESTRACE) 0.5 MG tablet   Oral   Take 0.5 mg by mouth daily.         . Lactobacillus (ACIDOPHILUS PO)   Oral  Take 1 tablet by mouth daily.         . Magnesium 250 MG TABS   Oral   Take 1 tablet by mouth daily.         . Multiple Vitamin (MULTIVITAMIN WITH MINERALS) TABS   Oral   Take 1 tablet by mouth daily.         Marland Kitchen oxyCODONE-acetaminophen (ROXICET) 5-325 MG per tablet   Oral   Take 1 tablet by mouth every 4 (four) hours as needed for pain.   20 tablet   0   . PRESCRIPTION MEDICATION   Inhalation   Inhale 2 puffs into the lungs every 6 (six) hours as needed (Inhaler as needed for shortness of breath).         . SUMAtriptan (IMITREX) 100 MG tablet   Oral   Take 100 mg by mouth as needed for migraine.         Marland Kitchen tiZANidine (ZANAFLEX) 4 MG capsule   Oral   Take 4 mg by mouth 2 (two) times daily.         . traZODone (DESYREL) 100 MG tablet   Oral    Take 200 mg by mouth at bedtime.          BP 121/73  Pulse 69  Temp(Src) 97.9 F (36.6 C) (Oral)  Resp 20  Ht 5\' 3"  (1.6 m)  Wt 125 lb (56.7 kg)  BMI 22.15 kg/m2  SpO2 99% Physical Exam  Nursing note and vitals reviewed. Constitutional: She is oriented to person, place, and time. She appears well-developed and well-nourished.  HENT:  Head: Normocephalic and atraumatic.  Eyes: EOM are normal. Pupils are equal, round, and reactive to light.  Neck: Neck supple.  Cardiovascular: Normal rate, regular rhythm and normal heart sounds.   No murmur heard. Pulmonary/Chest: Effort normal. No respiratory distress.  Abdominal: Soft. She exhibits no distension. There is no tenderness. There is no rebound and no guarding.  Neurological: She is alert and oriented to person, place, and time. No cranial nerve deficit.  Left sided upper extremity strength, 3+/5, and 4/4 LLE strength. Bilateral weak grip strength.  Skin: Skin is warm and dry.    ED Course   Procedures (including critical care time)  Labs Reviewed - No data to display No results found. No diagnosis found.  MDM  Pt comes in with cc of headache, neck pain. 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 Today she is coming in with cspine tenderness, and her exam is + for left sided weakness (not new), but that might be worse than usual.  We think the headaches are her chronic, migrainous headaches. No imaging required. The C-spine - will need imaging. She has some worsening left sided weakness, and pain.  We will give her pain meds, steroids and headache cocktails to start.  Dr. Katrinka Blazing to see patient next week, and is happy to get her Nsurgery and Neurology f.u if needed.     Derwood Kaplan, MD 08/08/12 1339

## 2012-12-04 ENCOUNTER — Ambulatory Visit (INDEPENDENT_AMBULATORY_CARE_PROVIDER_SITE_OTHER): Payer: Medicare Other | Admitting: Neurology

## 2012-12-04 ENCOUNTER — Encounter: Payer: Self-pay | Admitting: Neurology

## 2012-12-04 VITALS — BP 124/65 | HR 75 | Temp 96.8°F | Ht 63.0 in | Wt 135.0 lb

## 2012-12-04 DIAGNOSIS — R5381 Other malaise: Secondary | ICD-10-CM

## 2012-12-04 DIAGNOSIS — M542 Cervicalgia: Secondary | ICD-10-CM

## 2012-12-04 DIAGNOSIS — R531 Weakness: Secondary | ICD-10-CM

## 2012-12-04 NOTE — Patient Instructions (Signed)
Please try to get records from Dr. Alphonzo Dublin and from [employer].  We will do blood work, MRI brain and neck and EMG/NCV.

## 2012-12-04 NOTE — Progress Notes (Addendum)
Subjective:    Patient ID: Kayla [name] is a 52 y.o. female.  HPI   Kayla Foley, MD, PhD [facility] [address], [address] [address] [place], [place] [address]  Dear Kayla [name],   I saw your patient, Kayla [name], upon your kind request, in my neurologica clinic today for initial consultation of her weakness. The patient is unaccompanied today. As you know, Kayla [name] is a very pleasant 52 year old right-handed woman with an underlying medical history of low back pain, vitamin D deficiency, sleep apnea on BiPAP, smoker, hyperlipidemia, abdominal pain, degenerative cervical spine disease and low back pain who was previously diagnosed with ALS, but states, she was then told she does not have ALS. Unfortunately, I do not have any prior records from neurology to review. She used to see Kayla [name] out of [facility], who sent her to the ALS clinic in [place], where she was eventually told she had no ALS. She states her Sx dated back in 2006 with LE weakness, L>R. Sx became worse in 2008 and started having breathing and swallowing problems, had a feeding tube and was even in hospice care, was bed bound for about 18 months, then started improving. She also has neck pain, had neck surgery some 13 years ago and has a pinched nerve. She had a neck MRI under Kayla [name] in [facility]. She goes to [facility], a pain clinic in [facility] and gets neck injections.  She had an extensive w/u in [place], including blood work, MRI brain, CSF studies, and EMG/NCV testing. Her saw a Land in [place], who left. Prior to that she saw Kayla [name] at [facility], who told her she did not need to come back to see him. This is per her verbal report. 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  She states, she was told she was not a surgical candidate for her neck. She reports neck pain and weakness in her hands and L foot. She has had worsening weakness in the 2-3 months and has noted  muscle wasting and reports fasciculations.  Of note she had a cervical spine MRI in August of this year: Solid fusion at C5-C6 without recurrent stenosis. Unchanged C4-C5 and C6-C7 adjacent segment disease. C4-C5 shows mild to moderate central stenosis and bilateral foraminal stenosis potentially affecting both C5 nerves.   Her Past Medical History Is Significant For: Past Medical History  Diagnosis Date  . [name]     Her Past Surgical History Is Significant For: Past Surgical History  Procedure Laterality Date  . Abdominal hysterectomy    . Cholecystectomy    . Appendectomy    . Peg placement    . Neck surgery    . Esophagogastroduodenoscopy N/A 05/19/2012    Procedure: ESOPHAGOGASTRODUODENOSCOPY (EGD);  Surgeon: Kayla Belfast, MD;  Location: [facility] ENDOSCOPY;  Service: Endoscopy;  Laterality: N/A;    Her Family History Is Significant For: No family history on file.  Her Social History Is Significant For: History   Social History  . Marital Status: Single    Spouse Name: N/A    Number of Children: N/A  . Years of Education: N/A   Social History Main Topics  . Smoking status: Current Every Day Smoker -- 0.50 packs/day  . Smokeless tobacco: None  . Alcohol Use: No  . Drug Use: No  . Sexual Activity: Yes    Birth Control/ Protection: Surgical   Other Topics Concern  . None  Social History Narrative  . None    Her Allergies Are:  Allergies  Allergen Reactions  . Dilaudid [Hydromorphone Hcl] Itching  . Codeine Rash  :   Her Current Medications Are:  Outpatient Encounter Prescriptions as of 12/04/2012  Medication Sig  . beta carotene w/minerals (OCUVITE) tablet Take 1 tablet by mouth daily.  Marland Kitchen buPROPion (WELLBUTRIN XL) 300 MG 24 hr tablet Take 300 mg by mouth daily.  . clonazePAM (KLONOPIN) 1 MG tablet Take 1 mg by mouth 2 (two) times daily as needed for anxiety.  Marland Kitchen estradiol (ESTRACE) 0.5 MG tablet Take 0.5 mg by mouth daily.  . fluconazole  (DIFLUCAN) 150 MG tablet Take 1 tablet by mouth daily.  Marland Kitchen HYDROcodone-acetaminophen (NORCO/VICODIN) 5-325 MG per tablet Take 1 tablet by mouth every 6 (six) hours as needed for pain.  . Lactobacillus (ACIDOPHILUS PO) Take 1 tablet by mouth daily.  . Magnesium 250 MG TABS Take 1 tablet by mouth daily.  . Multiple Vitamin (MULTIVITAMIN WITH MINERALS) TABS Take 1 tablet by mouth daily.  Marland Kitchen PREMARIN vaginal cream Place 1 Applicatorful vaginally as needed.  Marland Kitchen PRESCRIPTION MEDICATION Inhale 2 puffs into the lungs every 6 (six) hours as needed (Inhaler as needed for shortness of breath).  . SUMAtriptan (IMITREX) 100 MG tablet Take 100 mg by mouth as needed for migraine.  . traZODone (DESYREL) 100 MG tablet Take 200 mg by mouth at bedtime.  . [DISCONTINUED] doxycycline (VIBRA-TABS) 100 MG tablet Take 1 tablet (100 mg total) by mouth 2 (two) times daily.  . [DISCONTINUED] methocarbamol (ROBAXIN) 500 MG tablet Take 1 tablet (500 mg total) by mouth 2 (two) times daily.  . [DISCONTINUED] ondansetron (ZOFRAN ODT) 8 MG disintegrating tablet Take 1 tablet (8 mg total) by mouth every 8 (eight) hours as needed for nausea.  . [DISCONTINUED] oxyCODONE-acetaminophen (ROXICET) 5-325 MG per tablet Take 1 tablet by mouth every 4 (four) hours as needed for pain.  . [DISCONTINUED] predniSONE (DELTASONE) 50 MG tablet Take 1 tablet (50 mg total) by mouth daily.  . [DISCONTINUED] tiZANidine (ZANAFLEX) 4 MG capsule Take 4 mg by mouth 2 (two) times daily.   Review of Systems:  Out of a complete 14 point review of systems, all are reviewed and negative with the exception of these symptoms as listed below:   Review of Systems  Constitutional: Positive for fatigue.  Eyes: Positive for visual disturbance (blurred vision).  Respiratory: Positive for shortness of breath.   Cardiovascular: Negative.   Gastrointestinal: Positive for constipation.  Endocrine: Positive for cold intolerance.  Genitourinary: Positive for difficulty  urinating.  Musculoskeletal: Positive for myalgias.       Cramps  Skin: Negative.   Allergic/Immunologic: Negative.   Neurological: Positive for dizziness, tremors, weakness, numbness and headaches.       Memory loss  Hematological: Negative.   Psychiatric/Behavioral: Positive for dysphoric mood. The patient is nervous/anxious.     Objective:  Neurologic Exam  Physical Exam Physical Examination:   Filed Vitals:   12/04/12 1000  BP: 124/65  Pulse: 75  Temp: 96.8 F (36 C)    General Examination: The patient is a very pleasant 52 y.o. female in no acute distress. She appears well-developed and well-nourished and well groomed.   HEENT: Normocephalic, atraumatic, pupils are equal, round and reactive to light and accommodation. Funduscopic exam is normal with sharp disc margins noted. Extraocular tracking is good without limitation to gaze excursion or nystagmus noted. Normal smooth pursuit is noted. Hearing is grossly intact. Tympanic membranes  are clear bilaterally. Face is symmetric with normal facial animation and normal facial sensation. Speech is clear with no dysarthria noted. There is no hypophonia. There is no lip, neck/head, jaw or voice tremor. Neck is supple with full range of passive and active motion. There are no carotid bruits on auscultation. Oropharynx exam reveals: mild mouth dryness, adequate dental hygiene and mild airway crowding, due to narrow airway. Mallampati is class II. Tongue protrudes centrally and palate elevates symmetrically.    Chest: Clear to auscultation without wheezing, rhonchi or crackles noted.  Heart: S1+S2+0, regular and normal without murmurs, rubs or gallops noted.   Abdomen: Soft, non-tender and non-distended with normal bowel sounds appreciated on auscultation.  Extremities: There is no pitting edema in the distal lower extremities bilaterally. Pedal pulses are intact.  Skin: Warm and dry without trophic changes noted. There are no  varicose veins.  Musculoskeletal: exam reveals no obvious joint deformities, tenderness or joint swelling or erythema.   Neurologically:  Mental status: The patient is awake, alert and oriented in all 4 spheres. Her memory, attention, language and knowledge are appropriate. There is no aphasia, agnosia, apraxia or anomia. Speech is clear with normal prosody and enunciation. Thought process is linear. Mood is congruent and affect is constricted.  Cranial nerves are as described above under HEENT exam. In addition, shoulder shrug is normal with equal shoulder height noted. Motor exam: Normal bulk, and tone is noted. Strength testing is difficult to interpret. She has variable strength with some giveaway weakness. She does have some left-sided weakness which seems to be primarily in her grip and wrist extensor and foot dorsi flexion. She does have some proximal weakness bilaterally which is of variable degree. I'm not sure that she is exerting enough effort. There is no drift, tremor or rebound. I do not appreciate much in the way of atrophy. She has no significant thenar or hypothenar atrophy. I do not detect any fasciculations. Romberg testing shows mild swaying but no corrective steps. Reflexes are 2+ in the upper extremities and 3+ in the lower extremities. Toes are downgoing bilaterally. Fine motor skills are intact with normal finger taps, normal hand movements, normal rapid alternating patting, normal foot taps and normal foot agility.  Cerebellar testing shows no dysmetria or intention tremor on finger to nose testing, with the exception of mildly slow movement and deliberate movements on the left side. Heel to shin is unremarkable bilaterally. There is no truncal or gait ataxia.  Sensory exam is intact to light touch, pinprick, vibration, temperature sense and proprioception in the upper and lower extremities.  Gait, station and balance: She stands up with mild difficulty. Her posture is  age-appropriate. She walks with a slight limp on the left. She has no scissoring and no steppage gait. There is no circumduction. She turns en bloc. Tandem walk is somewhat difficult for her but she is able to complete it. Intact toe and heel stance is noted.               Assessment and Plan:   In summary, Cristela Stalder is a very pleasant 52 y.o.-year old female with a history of neck degenerative disease and complaints of diffuse weakness, and radiating neck pain. She was previously labeled with ALS, but was misdiagnosed, she states. As I understand, she had extensive workup in the past. Unfortunately, I do not have any prior records available from [facility] or from [facility]. I've asked her to bring records next time. She recently had a  cervical spine MRI and is followed by her neurosurgeon for her degenerative spine disease. 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 She does report radiating neck pain and tingling in her right thumb intermittently. I am not sure how to interpret her physical exam. I do not see any telltale signs of upper motor neuron lesion. While she appears to have some weakness on not sure about the degree and extent of weakness. There is variable effort and variable weakness at times. At this juncture I have asked her to bring in her records and we will try to get him as well. She may be best served with a neuromuscular specialist. Nevertheless, at this time I would like to initiate workup in the form of a brain MRI, laboratory blood testing as well as EMG nerve conduction testing. We will take it from there. She was in agreement. Thank you very much for allowing me to participate in the care of this nice patient. If I can be of any further assistance to you please do not hesitate to call me at [phone].  Sincerely,   Kayla Foley, MD, PhD

## 2012-12-05 LAB — ANA W/REFLEX: Anti Nuclear Antibody(ANA): NEGATIVE

## 2012-12-05 LAB — B12 AND FOLATE PANEL
Folate: >19.9 ng/mL (ref >3.0)
Vitamin B-12: 621 pg/mL (ref 211–946)

## 2012-12-05 LAB — CK: Total CK: 95 U/L (ref 24–173)

## 2012-12-05 NOTE — Progress Notes (Signed)
Quick Note:  Please advise patient that her blood work was normal which includes vitamin B12, muscle enzymes, infectious marker, autoimmune marker, and thyroid screen. ______

## 2012-12-13 ENCOUNTER — Telehealth: Payer: Self-pay | Admitting: Neurology

## 2012-12-13 ENCOUNTER — Ambulatory Visit (INDEPENDENT_AMBULATORY_CARE_PROVIDER_SITE_OTHER): Payer: Medicare Other | Admitting: Neurology

## 2012-12-13 ENCOUNTER — Encounter (INDEPENDENT_AMBULATORY_CARE_PROVIDER_SITE_OTHER): Payer: Medicare Other

## 2012-12-13 DIAGNOSIS — Z0289 Encounter for other administrative examinations: Secondary | ICD-10-CM

## 2012-12-13 DIAGNOSIS — M6281 Muscle weakness (generalized): Secondary | ICD-10-CM

## 2012-12-13 DIAGNOSIS — M542 Cervicalgia: Secondary | ICD-10-CM

## 2012-12-13 DIAGNOSIS — R531 Weakness: Secondary | ICD-10-CM

## 2012-12-13 NOTE — Progress Notes (Signed)
Quick Note:  Please call and advise the patient that the recent EMG and nerve conduction velocity test, which is the electrical nerve and muscle test we we performed, was reported as within normal limits. We checked for abnormal electrical discharges in the muscles or nerves and the report suggested normal findings. No further action is required on this test at this time. Please remind patient to keep any upcoming appointments or tests and to call us with any interim questions, concerns, problems or updates. Thanks,  Kaliana Albino, MD, PhD   ______ 

## 2012-12-13 NOTE — Procedures (Signed)
HISTORY:  Kayla [name] is a 52 year old patient with an unusual history of generalized weakness and respiratory issues that began 8 years ago. 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 The patient made a miraculous recovery, and returned to a normal activity level approximately one and one half or 2 years ago. Within the last several months, the patient has had a recurrence of weakness in the arms and legs, and she reports some discomfort down the right arm from the neck. The patient has a history of prior cervical spine surgery. The patient is being evaluated for the weakness.  NERVE CONDUCTION STUDIES:  Nerve conduction studies were performed on both upper extremities. The distal motor latencies and motor amplitudes for the median and ulnar nerves were within normal limits. The F wave latencies and nerve conduction velocities for these nerves were also normal. The sensory latencies for the median and ulnar nerves were normal.   EMG STUDIES:  EMG study was performed on the right upper extremity:  The first dorsal interosseous muscle reveals 2 to 4 K units with full recruitment. No fibrillations or positive waves were noted. The abductor pollicis brevis muscle reveals 2 to 4 K units with full recruitment. No fibrillations or positive waves were noted. The extensor indicis proprius muscle reveals 1 to 3 K units with full recruitment. No fibrillations or positive waves were noted. The pronator teres muscle reveals 2 to 3 K units with full recruitment. No fibrillations or positive waves were noted. The biceps muscle reveals 1 to 2 K units with full recruitment. No fibrillations or positive waves were noted. The triceps muscle reveals 2 to 4 K units with full recruitment. No fibrillations or positive waves were noted. The anterior deltoid muscle reveals 2 to 3 K units with full recruitment. No fibrillations or  positive waves were noted. The cervical paraspinal muscles were tested at 2 levels. No abnormalities of insertional activity were seen at either level tested. There was good relaxation.  EMG study was performed on the left upper extremity:  The first dorsal interosseous muscle reveals 2 to 4 K units with full recruitment. No fibrillations or positive waves were noted. The abductor pollicis brevis muscle reveals 2 to 4 K units with full recruitment. No fibrillations or positive waves were noted. The extensor indicis proprius muscle reveals 1 to 3 K units with full recruitment. No fibrillations or positive waves were noted. The pronator teres muscle reveals 2 to 3 K units with full recruitment. No fibrillations or positive waves were noted. The biceps muscle reveals 1 to 2 K units with full recruitment. No fibrillations or positive waves were noted. The triceps muscle reveals 2 to 4 K units with full recruitment. No fibrillations or positive waves were noted. The anterior deltoid muscle reveals 2 to 3 K units with full recruitment. No fibrillations or positive waves were noted. The cervical paraspinal muscles were tested at 2 levels. No abnormalities of insertional activity were seen at either level tested. There was good relaxation.   IMPRESSION:  Nerve conduction studies done on both upper extremities were within normal limits. No evidence of a neuropathy is seen. EMG evaluation of both upper extremities are essentially normal. EMG pattern is consistent with poor motor effort or tremor. No acute or chronic denervation is seen. There is no evidence of any neuromuscular disorder on this evaluation.  Marlan Palau MD 12/13/2012 10:47 AM  [facility] [address] [place], [place] [address]  Phone [phone] Fax [phone]

## 2012-12-20 ENCOUNTER — Ambulatory Visit
Admission: RE | Admit: 2012-12-20 | Discharge: 2012-12-20 | Disposition: A | Discharge status: Home / Self Care | Payer: Medicare Other | Source: Ambulatory Visit | Attending: Neurology | Admitting: Neurology

## 2012-12-20 DIAGNOSIS — M542 Cervicalgia: Secondary | ICD-10-CM

## 2012-12-20 DIAGNOSIS — R5381 Other malaise: Secondary | ICD-10-CM

## 2012-12-20 DIAGNOSIS — R5383 Other fatigue: Secondary | ICD-10-CM

## 2012-12-20 DIAGNOSIS — R531 Weakness: Secondary | ICD-10-CM

## 2012-12-21 NOTE — Progress Notes (Signed)
Quick Note:  Shared normal MRI results with patient per Dr Teofilo Pod findings, she verbalized understanding ______

## 2012-12-21 NOTE — Progress Notes (Signed)
Quick Note:  Please call and advise the patient that the recent scan we did was within normal limits. We did a brain MRI without contrast and it was reported as normal. No further action is required on this test at this time. Please remind patient to keep any upcoming appointments or tests and to call us with any interim questions, concerns, problems or updates. Thanks,  Oktober Glazer, MD, PhD   ______ 

## 2013-01-22 ENCOUNTER — Emergency Department (HOSPITAL_BASED_OUTPATIENT_CLINIC_OR_DEPARTMENT_OTHER)
Admission: EM | Admit: 2013-01-22 | Discharge: 2013-01-22 | Disposition: A | Discharge status: Home / Self Care | Payer: Medicare Other | Attending: Emergency Medicine | Admitting: Emergency Medicine

## 2013-01-22 ENCOUNTER — Encounter (HOSPITAL_BASED_OUTPATIENT_CLINIC_OR_DEPARTMENT_OTHER): Payer: Self-pay | Admitting: Emergency Medicine

## 2013-01-22 ENCOUNTER — Emergency Department (HOSPITAL_BASED_OUTPATIENT_CLINIC_OR_DEPARTMENT_OTHER): Payer: Medicare Other

## 2013-01-22 DIAGNOSIS — Z8669 Personal history of other diseases of the nervous system and sense organs: Secondary | ICD-10-CM | POA: Insufficient documentation

## 2013-01-22 DIAGNOSIS — R5381 Other malaise: Secondary | ICD-10-CM | POA: Insufficient documentation

## 2013-01-22 DIAGNOSIS — M5412 Radiculopathy, cervical region: Secondary | ICD-10-CM

## 2013-01-22 DIAGNOSIS — Z79899 Other long term (current) drug therapy: Secondary | ICD-10-CM | POA: Insufficient documentation

## 2013-01-22 DIAGNOSIS — Z862 Personal history of diseases of the blood and blood-forming organs and certain disorders involving the immune mechanism: Secondary | ICD-10-CM | POA: Insufficient documentation

## 2013-01-22 DIAGNOSIS — R42 Dizziness and giddiness: Secondary | ICD-10-CM | POA: Insufficient documentation

## 2013-01-22 DIAGNOSIS — F17200 Nicotine dependence, unspecified, uncomplicated: Secondary | ICD-10-CM | POA: Insufficient documentation

## 2013-01-22 DIAGNOSIS — Z8639 Personal history of other endocrine, nutritional and metabolic disease: Secondary | ICD-10-CM | POA: Insufficient documentation

## 2013-01-22 DIAGNOSIS — R5383 Other fatigue: Secondary | ICD-10-CM

## 2013-01-22 HISTORY — DX: Gastrostomy status: Z93.1

## 2013-01-22 HISTORY — DX: Primary lateral sclerosis: G12.23

## 2013-01-22 LAB — CBC WITH DIFFERENTIAL/PLATELET
BASOS PCT: 0 % (ref 0–1)
Basophils Absolute: 0 10*3/uL (ref 0.0–0.1)
EOS ABS: 0.1 10*3/uL (ref 0.0–0.7)
Eosinophils Relative: 1 % (ref 0–5)
HEMATOCRIT: 41.2 % (ref 36.0–46.0)
HEMOGLOBIN: 13.8 g/dL (ref 12.0–15.0)
Lymphocytes Relative: 27 % (ref 12–46)
Lymphs Abs: 2.3 10*3/uL (ref 0.7–4.0)
MCH: 29.3 pg (ref 26.0–34.0)
MCHC: 33.5 g/dL (ref 30.0–36.0)
MCV: 87.5 fL (ref 78.0–100.0)
MONO ABS: 0.7 10*3/uL (ref 0.1–1.0)
MONOS PCT: 8 % (ref 3–12)
Neutro Abs: 5.6 10*3/uL (ref 1.7–7.7)
Neutrophils Relative %: 65 % (ref 43–77)
Platelets: 248 10*3/uL (ref 150–400)
RBC: 4.71 MIL/uL (ref 3.87–5.11)
RDW: 11.9 % (ref 11.5–15.5)
WBC: 8.7 10*3/uL (ref 4.0–10.5)

## 2013-01-22 LAB — BASIC METABOLIC PANEL
BUN: 11 mg/dL (ref 6–23)
CHLORIDE: 101 meq/L (ref 96–112)
CO2: 23 mEq/L (ref 19–32)
Calcium: 9.5 mg/dL (ref 8.4–10.5)
Creatinine, Ser: 1 mg/dL (ref 0.50–1.10)
GFR calc Af Amer: 74 mL/min — ABNORMAL LOW (ref >90)
GFR calc non Af Amer: 64 mL/min — ABNORMAL LOW (ref >90)
GLUCOSE: 95 mg/dL (ref 70–99)
POTASSIUM: 3.7 meq/L (ref 3.7–5.3)
Sodium: 141 mEq/L (ref 137–147)

## 2013-01-22 LAB — URINALYSIS, ROUTINE W REFLEX MICROSCOPIC
Bilirubin Urine: NEGATIVE
GLUCOSE, UA: NEGATIVE mg/dL
HGB URINE DIPSTICK: NEGATIVE
KETONES UR: NEGATIVE mg/dL
Leukocytes, UA: NEGATIVE
Nitrite: NEGATIVE
PROTEIN: NEGATIVE mg/dL
Specific Gravity, Urine: 1.005 (ref 1.005–1.030)
Urobilinogen, UA: 0.2 mg/dL (ref 0.0–1.0)
pH: 6.5 (ref 5.0–8.0)

## 2013-01-22 LAB — TROPONIN I: Troponin I: <0.3 ng/mL (ref <0.30)

## 2013-01-22 MED ORDER — HYDROCODONE-ACETAMINOPHEN 5-325 MG PO TABS
2.0000 | ORAL_TABLET | Freq: Once | ORAL | Status: AC
  Administered 2013-01-22: 2 via ORAL
  Filled 2013-01-22: qty 2

## 2013-01-22 MED ORDER — SODIUM CHLORIDE 0.9 % IV BOLUS (SEPSIS)
1000.0000 mL | Freq: Once | INTRAVENOUS | Status: AC
  Administered 2013-01-22: 1000 mL via INTRAVENOUS

## 2013-01-22 MED ORDER — HYDROCODONE-ACETAMINOPHEN 5-325 MG PO TABS: 1.0000 | ORAL_TABLET | Freq: Four times a day (QID) | ORAL | Status: DC | PRN

## 2013-01-22 MED ORDER — CYCLOBENZAPRINE HCL 10 MG PO TABS: 10.0000 mg | ORAL_TABLET | Freq: Two times a day (BID) | ORAL | Status: DC | PRN

## 2013-01-22 NOTE — ED Notes (Signed)
Pt reports she had a headache last night, went to bed and awakened feeling "lightheaded". She also reports neck pain radiating to left shoulder and left arm.  Took Clonazepam at 10:00am, took a nap, and still has symptoms.

## 2013-01-22 NOTE — ED Notes (Signed)
Pt. Reports she has been under some unusual stress lately.  Pt. Begins to cry and has a lot of PTSD.

## 2013-01-22 NOTE — ED Provider Notes (Signed)
CSN: [id number]     Arrival date & time 01/22/13  1335 History   First MD Initiated Contact with Patient 01/22/13 1402     Chief Complaint  Patient presents with  . Fatigue  . Neck Pain  . Arm Pain  . Extremity Weakness   (Consider location/radiation/quality/duration/timing/severity/associated sxs/prior Treatment) Patient is a 53 y.o. female presenting with neck injury and weakness. The history is provided by the patient. No language interpreter was used.  Neck Injury This is a recurrent problem. The current episode started 2 days ago. The problem occurs constantly. The problem has been gradually worsening. Pertinent negatives include no chest pain, no abdominal pain, no headaches and no shortness of breath. Exacerbated by: using L arm. Nothing relieves the symptoms. Treatments tried: OTC meds. The treatment provided no relief.  Weakness This is a recurrent (described as ligthheadeness, particularly when standing) problem. The current episode started more than 2 days ago. The problem occurs daily. The problem has not changed since onset.Pertinent negatives include no chest pain, no abdominal pain, no headaches and no shortness of breath. The symptoms are aggravated by walking and standing. The symptoms are relieved by rest and lying down. She has tried nothing for the symptoms. The treatment provided no relief.    Past Medical History  Diagnosis Date  . Hilda BladesLou Gehrig disease   . Primary lateral sclerosis   . Hyperlipidemia   . G tube feedings    Past Surgical History  Procedure Laterality Date  . Abdominal hysterectomy    . Cholecystectomy    . Appendectomy    . Peg placement    . Neck surgery    . Esophagogastroduodenoscopy N/A 05/19/2012    Procedure: ESOPHAGOGASTRODUODENOSCOPY (EGD);  Surgeon: Theda BelfastPatrick D Hung, MD;  [facility];  Service: Endoscopy;  Laterality: N/A;   No family history on file. History  Substance Use Topics  . Smoking status: Current Every Day Smoker --  0.50 packs/day    Types: Cigarettes  . Smokeless tobacco: Not on file  . Alcohol Use: No   OB History   Grav Para Term Preterm Abortions TAB SAB Ect Mult Living                 Review of Systems  Constitutional: Negative for fever, chills, diaphoresis, activity change, appetite change and fatigue.  HENT: Negative for congestion, facial swelling, rhinorrhea and sore throat.   Eyes: Negative for photophobia and discharge.  Respiratory: Negative for cough, chest tightness and shortness of breath.   Cardiovascular: Negative for chest pain, palpitations and leg swelling.  Gastrointestinal: Negative for nausea, vomiting, abdominal pain and diarrhea.  Endocrine: Negative for polydipsia and polyuria.  Genitourinary: Negative for dysuria, frequency, difficulty urinating and pelvic pain.  Musculoskeletal: Negative for arthralgias, back pain, neck pain and neck stiffness.  Skin: Negative for color change and wound.  Allergic/Immunologic: Negative for immunocompromised state.  Neurological: Positive for weakness. Negative for facial asymmetry, numbness and headaches.  Hematological: Does not bruise/bleed easily.  Psychiatric/Behavioral: Negative for confusion and agitation.    Allergies  Dilaudid and Codeine  Home Medications   Current Outpatient Rx  Name  Route  Sig  Dispense  Refill  . beta carotene w/minerals (OCUVITE) tablet   Oral   Take 1 tablet by mouth daily.         Marland Kitchen. buPROPion (WELLBUTRIN XL) 300 MG 24 hr tablet   Oral   Take 300 mg by mouth daily.         .Marland Kitchen  clonazePAM (KLONOPIN) 1 MG tablet   Oral   Take 1 mg by mouth 2 (two) times daily as needed for anxiety.         . cyclobenzaprine (FLEXERIL) 10 MG tablet   Oral   Take 1 tablet (10 mg total) by mouth 2 (two) times daily as needed for muscle spasms.   10 tablet   0   . estradiol (ESTRACE) 0.5 MG tablet   Oral   Take 0.5 mg by mouth daily.         . fluconazole (DIFLUCAN) 150 MG tablet   Oral   Take  1 tablet by mouth daily.         Marland Kitchen HYDROcodone-acetaminophen (NORCO) 5-325 MG per tablet   Oral   Take 1 tablet by mouth every 6 (six) hours as needed.   10 tablet   0   . HYDROcodone-acetaminophen (NORCO/VICODIN) 5-325 MG per tablet   Oral   Take 1 tablet by mouth every 6 (six) hours as needed for pain.   15 tablet   0   . Lactobacillus (ACIDOPHILUS PO)   Oral   Take 1 tablet by mouth daily.         . Magnesium 250 MG TABS   Oral   Take 1 tablet by mouth daily.         . Multiple Vitamin (MULTIVITAMIN WITH MINERALS) TABS   Oral   Take 1 tablet by mouth daily.         Marland Kitchen PREMARIN vaginal cream   Vaginal   Place 1 Applicatorful vaginally as needed.         Marland Kitchen PRESCRIPTION MEDICATION   Inhalation   Inhale 2 puffs into the lungs every 6 (six) hours as needed (Inhaler as needed for shortness of breath).         . SUMAtriptan (IMITREX) 100 MG tablet   Oral   Take 100 mg by mouth as needed for migraine.         . traZODone (DESYREL) 100 MG tablet   Oral   Take 200 mg by mouth at bedtime.          BP 115/51  Pulse 71  Resp 18  SpO2 98% Physical Exam  Constitutional: She is oriented to person, place, and time. She appears well-developed and well-nourished. No distress.  HENT:  Head: Normocephalic and atraumatic.  Mouth/Throat: No oropharyngeal exudate.  Eyes: Pupils are equal, round, and reactive to light.  Neck: Normal range of motion. Neck supple. Muscular tenderness present. No spinous process tenderness present.    Cardiovascular: Normal rate, regular rhythm and normal heart sounds.  Exam reveals no gallop and no friction rub.   No murmur heard. Pulmonary/Chest: Effort normal and breath sounds normal. No respiratory distress. She has no wheezes. She has no rales.  Abdominal: Soft. Bowel sounds are normal. She exhibits no distension and no mass. There is no tenderness. There is no rebound and no guarding.  Musculoskeletal: Normal range of motion.  She exhibits no edema.       Left shoulder: She exhibits tenderness. She exhibits normal range of motion and no bony tenderness.  Neurological: She is alert and oriented to person, place, and time. No cranial nerve deficit or sensory deficit. Coordination and gait normal. GCS eye subscore is 4. GCS verbal subscore is 5. GCS motor subscore is 6.  Poor effort on strength testing with variable results.  She appear to have difficulty raising, moving arm when testing specifically for  strength of LUE, but does not have difficulty when testing for rapid alternating mvmts or for finger-nose testing.   Skin: Skin is warm and dry.  Psychiatric: She has a normal mood and affect.    ED Course  Procedures (including critical care time) Labs Review Labs Reviewed  BASIC METABOLIC PANEL - Abnormal; Notable for the following:    GFR calc non Af Amer 64 (*)    GFR calc Af Amer 74 (*)    All other components within normal limits  URINE CULTURE  CBC WITH DIFFERENTIAL  TROPONIN I  URINALYSIS, ROUTINE W REFLEX MICROSCOPIC   Imaging Review Dg Chest 2 View  01/22/2013   CLINICAL DATA:  Chest pain.  EXAM: CHEST  2 VIEW  COMPARISON:  CT chest 01/16/2010.  Chest x-ray 01/16/2010.  FINDINGS: Mediastinum and hilar structures normal. Lungs are clear. Heart size normal. No pleural effusion pneumothorax. Prior cervical spine fusion. No acute bony abnormality. Tiny bony density noted right humeral head is most likely a bone island.  IMPRESSION: No active cardiopulmonary disease.   Electronically Signed   By: Maisie Fus  Register   On: 01/22/2013 15:35    EKG Interpretation    Date/Time:  Monday January 22 2013 13:43:05 EST Ventricular Rate:  82 PR Interval:  150 QRS Duration: 80 QT Interval:  356 QTC Calculation: 415 R Axis:   71 Text Interpretation:  Normal sinus rhythm Normal ECG No significant change since last tracing Confirmed by DOCHERTY  MD, MEGAN ([id number]) on 01/22/2013 2:04:06 PM            MDM   1.  Cervical radicular pain   2. Lightheadedness    Pt is a 53 y.o. female with Pmhx as above who presents with L sided neck, jaw, arm pain, heaviness since at least last night, as well as 1 week of generalized weakness, fatigue, episodic lightheadedness, chills.  No fever, CP, vom, d/a.  +urinary frequency. Pt reports having hx of neck pain, L arm weakness at baseline, but states this pain is different because it doesn't hurt as bad.  She reports hx of a neuromuscular disease, but has had recent nml EMG on 12/13/12 and nml MRI brain on 12/20/12 which were nml.  On PE, VSS, pt in NAD.  She has weakness of LUE/hand when testing strength (reports cannot close hand fully), but appears to have no difficulty, able to manipulate arm/hand normally when testing rapid alternating mvmts.  Nml sensation reported. CBC, BMP, EKG unremarkable.  Trop negative.  Doubt ACS given age, lack of risk factors.  Doubt CVA given exam seems to be consistent with priors.  I suspect symptoms related to her chronic cervical degeneration.  Have asked her to f/u with her neurosurgeon for her neck symptoms, as well as her PCP for lightheadedness.  She can continue w/u as scheduled with her neurologist.         Shanna Cisco, MD 01/23/13 1203

## 2013-01-22 NOTE — Discharge Instructions (Signed)
Cervical Radiculopathy Cervical radiculopathy happens when a nerve in the neck is pinched or bruised by a slipped (herniated) disk or by arthritic changes in the bones of the cervical spine. This can occur due to an injury or as part of the normal aging process. Pressure on the cervical nerves can cause pain or numbness that runs from your neck all the way down into your arm and fingers. CAUSES  There are many possible causes, including:  Injury.  Muscle tightness in the neck from overuse.  Swollen, painful joints (arthritis).  Breakdown or degeneration in the bones and joints of the spine (spondylosis) due to aging.  Bone spurs that may develop near the cervical nerves. SYMPTOMS  Symptoms include pain, weakness, or numbness in the affected arm and hand. Pain can be severe or irritating. Symptoms may be worse when extending or turning the neck. DIAGNOSIS  Your caregiver will ask about your symptoms and do a physical exam. He or she may test your strength and reflexes. X-rays, CT scans, and MRI scans may be needed in cases of injury or if the symptoms do not go away after a period of time. Electromyography (EMG) or nerve conduction testing may be done to study how your nerves and muscles are working. TREATMENT  Your caregiver may recommend certain exercises to help relieve your symptoms. Cervical radiculopathy can, and often does, get better with time and treatment. If your problems continue, treatment options may include:  Wearing a soft collar for short periods of time.  Physical therapy to strengthen the neck muscles.  Medicines, such as nonsteroidal anti-inflammatory drugs (NSAIDs), oral corticosteroids, or spinal injections.  Surgery. Different types of surgery may be done depending on the cause of your problems. HOME CARE INSTRUCTIONS   Put ice on the affected area.  Put ice in a plastic bag.  Place a towel between your skin and the bag.  Leave the ice on for 15-20 minutes,  03-04 times a day or as directed by your caregiver.  If ice does not help, you can try using heat. Take a warm shower or bath, or use a hot water bottle as directed by your caregiver.  You may try a gentle neck and shoulder massage.  Use a flat pillow when you sleep.  Only take over-the-counter or prescription medicines for pain, discomfort, or fever as directed by your caregiver.  If physical therapy was prescribed, follow your caregiver's directions.  If a soft collar was prescribed, use it as directed. SEEK IMMEDIATE MEDICAL CARE IF:   Your pain gets much worse and cannot be controlled with medicines.  You have weakness or numbness in your hand, arm, face, or leg.  You have a high fever or a stiff, rigid neck.  You lose bowel or bladder control (incontinence).  You have trouble with walking, balance, or speaking. MAKE SURE YOU:   Understand these instructions.  Will watch your condition.  Will get help right away if you are not doing well or get worse. Document Released: 09/15/2000 Document Revised: 03/15/2011 Document Reviewed: 08/04/2010 [facility] Patient Information 2014 [place], [place]. Dizziness Dizziness is a common problem. It is a feeling of unsteadiness or lightheadedness. You may feel like you are about to faint. Dizziness can lead to injury if you stumble or fall. A person of any age group can suffer from dizziness, but dizziness is more common in older adults. CAUSES  Dizziness can be caused by many different things, including:  Middle ear problems.  Standing for too long.  Infections.  An allergic reaction.  Aging.  An emotional response to something, such as the sight of blood.  Side effects of medicines.  Fatigue.  Problems with circulation or blood pressure.  Excess use of alcohol, medicines, or illegal drug use.  Breathing too fast (hyperventilation).  An arrhythmia or problems with your heart rhythm.  Low red blood cell count  (anemia).  Pregnancy.  Vomiting, diarrhea, fever, or other illnesses that cause dehydration.  Diseases or conditions such as Parkinson's disease, high blood pressure (hypertension), diabetes, and thyroid problems.  Exposure to extreme heat. DIAGNOSIS  To find the cause of your dizziness, your caregiver may do a physical exam, lab tests, radiologic imaging scans, or an electrocardiography test (ECG).  TREATMENT  Treatment of dizziness depends on the cause of your symptoms and can vary greatly. HOME CARE INSTRUCTIONS   Drink enough fluids to keep your urine clear or pale yellow. This is especially important in very hot weather. In the elderly, it is also important in cold weather.  If your dizziness is caused by medicines, take them exactly as directed. When taking blood pressure medicines, it is especially important to get up slowly.  Rise slowly from chairs and steady yourself until you feel okay.  In the morning, first sit up on the side of the bed. When this seems okay, stand slowly while holding onto something until you know your balance is fine.  If you need to stand in one place for a long time, be sure to move your legs often. Tighten and relax the muscles in your legs while standing.  If dizziness continues to be a problem, have someone stay with you for a day or two. Do this until you feel you are well enough to stay alone. Have the person call your caregiver if he or she notices changes in you that are concerning.  Do not drive or use heavy machinery if you feel dizzy.  Do not drink alcohol. SEEK IMMEDIATE MEDICAL CARE IF:   Your dizziness or lightheadedness gets worse.  You feel nauseous or vomit.  You develop problems with talking, walking, weakness, or using your arms, hands, or legs.  You are not thinking clearly or you have difficulty forming sentences. It may take a friend or family member to determine if your thinking is normal.  You develop chest pain,  abdominal pain, shortness of breath, or sweating.  Your vision changes.  You notice any bleeding.  You have side effects from medicine that seems to be getting worse rather than better. MAKE SURE YOU:   Understand these instructions.  Will watch your condition.  Will get help right away if you are not doing well or get worse. Document Released: 06/16/2000 Document Revised: 03/15/2011 Document Reviewed: 07/10/2010 [facility] Patient Information 2014 [place].

## 2013-01-24 LAB — URINE CULTURE
CULTURE: NO GROWTH
Colony Count: NO GROWTH

## 2013-02-24 ENCOUNTER — Encounter (HOSPITAL_BASED_OUTPATIENT_CLINIC_OR_DEPARTMENT_OTHER): Payer: Self-pay | Admitting: Emergency Medicine

## 2013-02-24 ENCOUNTER — Emergency Department (HOSPITAL_BASED_OUTPATIENT_CLINIC_OR_DEPARTMENT_OTHER)
Admission: EM | Admit: 2013-02-24 | Discharge: 2013-02-24 | Disposition: A | Discharge status: Home / Self Care | Payer: Medicare Other | Attending: Emergency Medicine | Admitting: Emergency Medicine

## 2013-02-24 ENCOUNTER — Emergency Department (HOSPITAL_BASED_OUTPATIENT_CLINIC_OR_DEPARTMENT_OTHER): Payer: Medicare Other

## 2013-02-24 DIAGNOSIS — S4980XA Other specified injuries of shoulder and upper arm, unspecified arm, initial encounter: Secondary | ICD-10-CM | POA: Insufficient documentation

## 2013-02-24 DIAGNOSIS — S52599A Other fractures of lower end of unspecified radius, initial encounter for closed fracture: Secondary | ICD-10-CM | POA: Insufficient documentation

## 2013-02-24 DIAGNOSIS — Y9389 Activity, other specified: Secondary | ICD-10-CM | POA: Insufficient documentation

## 2013-02-24 DIAGNOSIS — E785 Hyperlipidemia, unspecified: Secondary | ICD-10-CM | POA: Insufficient documentation

## 2013-02-24 DIAGNOSIS — Z79899 Other long term (current) drug therapy: Secondary | ICD-10-CM | POA: Insufficient documentation

## 2013-02-24 DIAGNOSIS — Z8669 Personal history of other diseases of the nervous system and sense organs: Secondary | ICD-10-CM | POA: Insufficient documentation

## 2013-02-24 DIAGNOSIS — W010XXA Fall on same level from slipping, tripping and stumbling without subsequent striking against object, initial encounter: Secondary | ICD-10-CM | POA: Insufficient documentation

## 2013-02-24 DIAGNOSIS — Y929 Unspecified place or not applicable: Secondary | ICD-10-CM | POA: Insufficient documentation

## 2013-02-24 DIAGNOSIS — F17200 Nicotine dependence, unspecified, uncomplicated: Secondary | ICD-10-CM | POA: Insufficient documentation

## 2013-02-24 DIAGNOSIS — S52501A Unspecified fracture of the lower end of right radius, initial encounter for closed fracture: Secondary | ICD-10-CM | POA: Diagnosis present

## 2013-02-24 DIAGNOSIS — Z931 Gastrostomy status: Secondary | ICD-10-CM | POA: Insufficient documentation

## 2013-02-24 DIAGNOSIS — S46909A Unspecified injury of unspecified muscle, fascia and tendon at shoulder and upper arm level, unspecified arm, initial encounter: Secondary | ICD-10-CM | POA: Insufficient documentation

## 2013-02-24 MED ORDER — OXYCODONE-ACETAMINOPHEN 5-325 MG PO TABS: 1.0000 | ORAL_TABLET | Freq: Four times a day (QID) | ORAL | Status: DC | PRN

## 2013-02-24 MED ORDER — OXYCODONE-ACETAMINOPHEN 5-325 MG PO TABS
1.0000 | ORAL_TABLET | Freq: Once | ORAL | Status: AC
  Administered 2013-02-24: 1 via ORAL
  Filled 2013-02-24: qty 1

## 2013-02-24 NOTE — ED Provider Notes (Signed)
CSN: [id number]     Arrival date & time 02/24/13  1313 History   First MD Initiated Contact with Patient 02/24/13 1426     Chief Complaint  Patient presents with  . Arm Injury     (Consider location/radiation/quality/duration/timing/severity/associated sxs/prior Treatment) Patient is a 53 y.o. female presenting with arm injury. The history is provided by the patient.  Arm Injury Location:  Wrist Time since incident:  1 hour Injury: yes   Mechanism of injury: fall   Fall:    Impact surface:  [employer] of impact: right hand/wrist.   Entrapped after fall: no   Wrist location:  R wrist Pain details:    Quality:  Aching   Radiates to:  R arm   Severity:  Moderate   Onset quality:  Sudden   Duration:  1 hour   Timing:  Constant   Progression:  Unchanged Chronicity:  New Handedness:  Right-handed Dislocation: no   Foreign body present:  No foreign bodies Prior injury to area:  No Relieved by:  Nothing Worsened by:  Nothing tried Ineffective treatments:  None tried Associated symptoms: no back pain, no fatigue, no fever and no neck pain     Past Medical History  Diagnosis Date  . [name]   . Primary lateral sclerosis   . Hyperlipidemia   . G tube feedings    Past Surgical History  Procedure Laterality Date  . Abdominal hysterectomy    . Cholecystectomy    . Appendectomy    . Peg placement    . Neck surgery    . Esophagogastroduodenoscopy N/A 05/19/2012    Procedure: ESOPHAGOGASTRODUODENOSCOPY (EGD);  Surgeon: Theda Belfast, MD;  Location: [facility]COPY;  Service: Endoscopy;  Laterality: N/A;   No family history on file. History  Substance Use Topics  . Smoking status: Current Every Day Smoker -- 0.50 packs/day    Types: Cigarettes  . Smokeless tobacco: Not on file  . Alcohol Use: No   OB History   Grav Para Term Preterm Abortions TAB SAB Ect Mult Living                 Review of Systems  Constitutional: Negative for fever and fatigue.  HENT:  Negative for congestion and drooling.   Eyes: Negative for pain.  Respiratory: Negative for cough and shortness of breath.   Cardiovascular: Negative for chest pain.  Gastrointestinal: Negative for nausea, vomiting, abdominal pain and diarrhea.  Genitourinary: Negative for dysuria and hematuria.  Musculoskeletal: Negative for back pain, gait problem and neck pain.  Skin: Negative for color change.  Neurological: Negative for dizziness and headaches.  Hematological: Negative for adenopathy.  Psychiatric/Behavioral: Negative for behavioral problems.  All other systems reviewed and are negative.      Allergies  Dilaudid and Codeine  Home Medications   Current Outpatient Rx  Name  Route  Sig  Dispense  Refill  . beta carotene w/minerals (OCUVITE) tablet   Oral   Take 1 tablet by mouth daily.         Marland Kitchen buPROPion (WELLBUTRIN XL) 300 MG 24 hr tablet   Oral   Take 300 mg by mouth daily.         . clonazePAM (KLONOPIN) 1 MG tablet   Oral   Take 1 mg by mouth 2 (two) times daily as needed for anxiety.         . cyclobenzaprine (FLEXERIL) 10 MG tablet   Oral   Take 1  tablet (10 mg total) by mouth 2 (two) times daily as needed for muscle spasms.   10 tablet   0   . estradiol (ESTRACE) 0.5 MG tablet   Oral   Take 0.5 mg by mouth daily.         . fluconazole (DIFLUCAN) 150 MG tablet   Oral   Take 1 tablet by mouth daily.         Marland Kitchen HYDROcodone-acetaminophen (NORCO) 5-325 MG per tablet   Oral   Take 1 tablet by mouth every 6 (six) hours as needed.   10 tablet   0   . HYDROcodone-acetaminophen (NORCO/VICODIN) 5-325 MG per tablet   Oral   Take 1 tablet by mouth every 6 (six) hours as needed for pain.   15 tablet   0   . Lactobacillus (ACIDOPHILUS PO)   Oral   Take 1 tablet by mouth daily.         . Magnesium 250 MG TABS   Oral   Take 1 tablet by mouth daily.         . Multiple Vitamin (MULTIVITAMIN WITH MINERALS) TABS   Oral   Take 1 tablet by mouth  daily.         Marland Kitchen PREMARIN vaginal cream   Vaginal   Place 1 Applicatorful vaginally as needed.         Marland Kitchen PRESCRIPTION MEDICATION   Inhalation   Inhale 2 puffs into the lungs every 6 (six) hours as needed (Inhaler as needed for shortness of breath).         . SUMAtriptan (IMITREX) 100 MG tablet   Oral   Take 100 mg by mouth as needed for migraine.         . traZODone (DESYREL) 100 MG tablet   Oral   Take 200 mg by mouth at bedtime.          BP 120/67  Pulse 90  Temp(Src) 97.8 F (36.6 C) (Oral)  Resp 18  Ht 5\' 3"  (1.6 m)  Wt 124 lb (56.246 kg)  BMI 21.97 kg/m2  SpO2 100% Physical Exam  Nursing note and vitals reviewed. Constitutional: She is oriented to person, place, and time. She appears well-developed and well-nourished.  HENT:  Head: Normocephalic and atraumatic.  Mouth/Throat: Oropharynx is clear and moist. No oropharyngeal exudate.  Tympanic membranes clear bilaterally.  Eyes: Conjunctivae and EOM are normal. Pupils are equal, round, and reactive to light.  Neck: Normal range of motion. Neck supple.  No focal cervical or other vertebral tenderness to palpation.  Cardiovascular: Normal rate, regular rhythm, normal heart sounds and intact distal pulses.  Exam reveals no gallop and no friction rub.   No murmur heard. Pulmonary/Chest: Effort normal and breath sounds normal. No respiratory distress. She has no wheezes.  Abdominal: Soft. Bowel sounds are normal. There is no tenderness. There is no rebound and no guarding.  Musculoskeletal: Normal range of motion. She exhibits no edema and no tenderness.  Normal passive range of motion of the right shoulder. Mild tenderness to palpation of the right anterior and posterior shoulder.  2+ distal pulses in upper extremities.  Sensation diffusely intact in the right upper extremity.  Motor skills intact in the right upper extremity.  Mild to moderate focal tenderness to palpation of the radial aspect of the right  wrist. Mild swelling in this area.  Neurological: She is alert and oriented to person, place, and time.  Skin: Skin is warm and dry.  Psychiatric: She  has a normal mood and affect. Her behavior is normal.    ED Course  Procedures (including critical care time) Labs Review Labs Reviewed - No data to display Imaging Review Dg Forearm Right  02/24/2013   CLINICAL DATA:  Fall, arm and wrist pain, bruising  EXAM: RIGHT FOREARM - 2 VIEW  COMPARISON:  02/24/2013  FINDINGS: There is an acute nondisplaced fracture of the right distal radius. Remote ulnar styloid avulsion noted. Mild soft tissue swelling. Normal alignment.  IMPRESSION: Acute nondisplaced right distal radius fracture.   Electronically Signed   By: Ruel Favorsrevor  Shick M.D.   On: 02/24/2013 13:59   Dg Wrist Complete Right  02/24/2013   CLINICAL DATA:  Fall, pain  EXAM: RIGHT WRIST - COMPLETE 3+ VIEW  COMPARISON:  02/24/2013  FINDINGS: There is an acute nondisplaced right distal radius fracture. Mild soft tissue swelling. Remote styloid avulsion. Carpal bones and metacarpals appear intact. Normal alignment.  IMPRESSION: Acute nondisplaced right distal radius fracture   Electronically Signed   By: Ruel Favorsrevor  Shick M.D.   On: 02/24/2013 14:00    EKG Interpretation   None       MDM   Final diagnoses:  Closed fracture of right distal radius    2:39 PM 53 y.o. female who presents with a mechanical fall which occurred prior to arrival. The patient states that she slipped on ice putting most of her force onto her right wrist. She states that she may have hit her head slightly. She denies any loss of consciousness. She denies any headache currently. Her current complaint is of right shoulder and right wrist pain. She has normal range of motion of the right shoulder and I suspect her pain here is musculoskeletal in nature. She was found to have a right distal radius fracture which I will place in a short arm splint. She is neurovascularly intact.    3:59 PM:  I have discussed the diagnosis/risks/treatment options with the patient and believe the pt to be eligible for discharge home to follow-up with Dr. Orlan Leavensrtman on monday. We also discussed returning to the ED immediately if new or worsening sx occur. We discussed the sx which are most concerning (e.g., worsening pain, fever) that necessitate immediate return. Medications administered to the patient during their visit and any new prescriptions provided to the patient are listed below.  Medications given during this visit Medications  oxyCODONE-acetaminophen (PERCOCET/ROXICET) 5-325 MG per tablet 1 tablet (1 tablet Oral Given 02/24/13 1444)    New Prescriptions   OXYCODONE-ACETAMINOPHEN (PERCOCET) 5-325 MG PER TABLET    Take 1 tablet by mouth every 6 (six) hours as needed for moderate pain.     Junius ArgyleForrest S Merritt Kibby, MD 02/24/13 2049

## 2013-02-24 NOTE — ED Notes (Signed)
Fell on the ice-injured right forearm.  Denies striking head, LOC.

## 2013-02-24 NOTE — Discharge Instructions (Signed)
Cast or Splint Care °Casts and splints support injured limbs and keep bones from moving while they heal.  °HOME CARE °· Keep the cast or splint uncovered during the drying period. °· A plaster cast can take 24 to 48 hours to dry. °· A fiberglass cast will dry in less than 1 hour. °· Do not rest the cast on anything harder than a pillow for 24 hours. °· Do not put weight on your injured limb. Do not put pressure on the cast. Wait for your doctor's approval. °· Keep the cast or splint dry. °· Cover the cast or splint with a plastic bag during baths or wet weather. °· If you have a cast over your chest and belly (trunk), take sponge baths until the cast is taken off. °· If your cast gets wet, dry it with a towel or blow dryer. Use the cool setting on the blow dryer. °· Keep your cast or splint clean. Wash a dirty cast with a damp cloth. °· Do not put any objects under your cast or splint. °· Do not scratch the skin under the cast with an object. If itching is a problem, use a blow dryer on a cool setting over the itchy area. °· Do not trim or cut your cast. °· Do not take out the padding from inside your cast. °· Exercise your joints near the cast as told by your doctor. °· Raise (elevate) your injured limb on 1 or 2 pillows for the first 1 to 3 days. °GET HELP IF: °· Your cast or splint cracks. °· Your cast or splint is too tight or too loose. °· You itch badly under the cast. °· Your cast gets wet or has a soft spot. °· You have a bad smell coming from the cast. °· You get an object stuck under the cast. °· Your skin around the cast becomes red or sore. °· You have new or more pain after the cast is put on. °GET HELP RIGHT AWAY IF: °· You have fluid leaking through the cast. °· You cannot move your fingers or toes. °· Your fingers or toes turn blue or white or are cool, painful, or puffy (swollen). °· You have tingling or lose feeling (numbness) around the injured area. °· You have bad pain or pressure under the  cast. °· You have trouble breathing or have shortness of breath. °· You have chest pain. °Document Released: 04/22/2010 Document Revised: 08/23/2012 Document Reviewed: 06/29/2012 °[employer]® Patient Information ©2014 [employer]. ° °

## 2013-03-21 ENCOUNTER — Emergency Department (HOSPITAL_BASED_OUTPATIENT_CLINIC_OR_DEPARTMENT_OTHER): Payer: Medicare Other

## 2013-03-21 ENCOUNTER — Encounter (HOSPITAL_BASED_OUTPATIENT_CLINIC_OR_DEPARTMENT_OTHER): Payer: Self-pay | Admitting: Emergency Medicine

## 2013-03-21 ENCOUNTER — Emergency Department (HOSPITAL_BASED_OUTPATIENT_CLINIC_OR_DEPARTMENT_OTHER)
Admission: EM | Admit: 2013-03-21 | Discharge: 2013-03-21 | Disposition: A | Discharge status: Home / Self Care | Payer: Medicare Other | Attending: Emergency Medicine | Admitting: Emergency Medicine

## 2013-03-21 DIAGNOSIS — Z9089 Acquired absence of other organs: Secondary | ICD-10-CM | POA: Insufficient documentation

## 2013-03-21 DIAGNOSIS — R197 Diarrhea, unspecified: Secondary | ICD-10-CM | POA: Insufficient documentation

## 2013-03-21 DIAGNOSIS — Z79899 Other long term (current) drug therapy: Secondary | ICD-10-CM | POA: Insufficient documentation

## 2013-03-21 DIAGNOSIS — E785 Hyperlipidemia, unspecified: Secondary | ICD-10-CM | POA: Insufficient documentation

## 2013-03-21 DIAGNOSIS — F17200 Nicotine dependence, unspecified, uncomplicated: Secondary | ICD-10-CM | POA: Insufficient documentation

## 2013-03-21 DIAGNOSIS — R0602 Shortness of breath: Secondary | ICD-10-CM | POA: Insufficient documentation

## 2013-03-21 DIAGNOSIS — Z90710 Acquired absence of both cervix and uterus: Secondary | ICD-10-CM | POA: Insufficient documentation

## 2013-03-21 DIAGNOSIS — Z931 Gastrostomy status: Secondary | ICD-10-CM | POA: Insufficient documentation

## 2013-03-21 DIAGNOSIS — L089 Local infection of the skin and subcutaneous tissue, unspecified: Secondary | ICD-10-CM | POA: Insufficient documentation

## 2013-03-21 DIAGNOSIS — Z9889 Other specified postprocedural states: Secondary | ICD-10-CM | POA: Insufficient documentation

## 2013-03-21 DIAGNOSIS — Z792 Long term (current) use of antibiotics: Secondary | ICD-10-CM | POA: Insufficient documentation

## 2013-03-21 DIAGNOSIS — Z8669 Personal history of other diseases of the nervous system and sense organs: Secondary | ICD-10-CM | POA: Insufficient documentation

## 2013-03-21 LAB — COMPREHENSIVE METABOLIC PANEL
ALT: 11 U/L (ref 0–35)
AST: 15 U/L (ref 0–37)
Albumin: 4 g/dL (ref 3.5–5.2)
Alkaline Phosphatase: 69 U/L (ref 39–117)
BUN: 8 mg/dL (ref 6–23)
CALCIUM: 9.5 mg/dL (ref 8.4–10.5)
CO2: 28 mEq/L (ref 19–32)
Chloride: 101 mEq/L (ref 96–112)
Creatinine, Ser: 1 mg/dL (ref 0.50–1.10)
GFR calc Af Amer: 73 mL/min — ABNORMAL LOW (ref >90)
GFR calc non Af Amer: 63 mL/min — ABNORMAL LOW (ref >90)
GLUCOSE: 92 mg/dL (ref 70–99)
Potassium: 3.7 mEq/L (ref 3.7–5.3)
Sodium: 141 mEq/L (ref 137–147)
TOTAL PROTEIN: 7.4 g/dL (ref 6.0–8.3)
Total Bilirubin: 0.3 mg/dL (ref 0.3–1.2)

## 2013-03-21 LAB — CBC WITH DIFFERENTIAL/PLATELET
Basophils Absolute: 0 10*3/uL (ref 0.0–0.1)
Basophils Relative: 0 % (ref 0–1)
EOS ABS: 0.1 10*3/uL (ref 0.0–0.7)
Eosinophils Relative: 1 % (ref 0–5)
HCT: 40.3 % (ref 36.0–46.0)
HEMOGLOBIN: 13.5 g/dL (ref 12.0–15.0)
LYMPHS ABS: 2 10*3/uL (ref 0.7–4.0)
Lymphocytes Relative: 18 % (ref 12–46)
MCH: 30.3 pg (ref 26.0–34.0)
MCHC: 33.5 g/dL (ref 30.0–36.0)
MCV: 90.6 fL (ref 78.0–100.0)
MONOS PCT: 8 % (ref 3–12)
Monocytes Absolute: 0.9 10*3/uL (ref 0.1–1.0)
Neutro Abs: 8 10*3/uL — ABNORMAL HIGH (ref 1.7–7.7)
Neutrophils Relative %: 73 % (ref 43–77)
Platelets: 239 10*3/uL (ref 150–400)
RBC: 4.45 MIL/uL (ref 3.87–5.11)
RDW: 12.6 % (ref 11.5–15.5)
WBC: 10.9 10*3/uL — ABNORMAL HIGH (ref 4.0–10.5)

## 2013-03-21 LAB — I-STAT CG4 LACTIC ACID, ED: Lactic Acid, Venous: 0.99 mmol/L (ref 0.5–2.2)

## 2013-03-21 LAB — LIPASE, BLOOD: LIPASE: 21 U/L (ref 11–59)

## 2013-03-21 MED ORDER — ONDANSETRON HCL 4 MG PO TABS: 4.0000 mg | ORAL_TABLET | Freq: Four times a day (QID) | ORAL | Status: DC

## 2013-03-21 MED ORDER — ONDANSETRON HCL 4 MG/2ML IJ SOLN
4.0000 mg | Freq: Once | INTRAMUSCULAR | Status: AC
  Administered 2013-03-21 (×2): 4 mg via INTRAVENOUS
  Filled 2013-03-21: qty 2

## 2013-03-21 MED ORDER — MORPHINE SULFATE 4 MG/ML IJ SOLN
4.0000 mg | Freq: Once | INTRAMUSCULAR | Status: AC
  Administered 2013-03-21: 4 mg via INTRAVENOUS

## 2013-03-21 MED ORDER — VANCOMYCIN HCL IN DEXTROSE 1-5 GM/200ML-% IV SOLN
1000.0000 mg | Freq: Once | INTRAVENOUS | Status: AC
  Administered 2013-03-21: 1000 mg via INTRAVENOUS
  Filled 2013-03-21: qty 200

## 2013-03-21 MED ORDER — IOHEXOL 300 MG/ML  SOLN
100.0000 mL | Freq: Once | INTRAMUSCULAR | Status: AC | PRN
  Administered 2013-03-21: 100 mL via INTRAVENOUS

## 2013-03-21 MED ORDER — MORPHINE SULFATE 4 MG/ML IJ SOLN
INTRAMUSCULAR | Status: AC
  Filled 2013-03-21: qty 1

## 2013-03-21 MED ORDER — MORPHINE SULFATE 4 MG/ML IJ SOLN
4.0000 mg | Freq: Once | INTRAMUSCULAR | Status: AC
  Administered 2013-03-21: 4 mg via INTRAVENOUS
  Filled 2013-03-21: qty 1

## 2013-03-21 MED ORDER — IOHEXOL 300 MG/ML  SOLN
50.0000 mL | Freq: Once | INTRAMUSCULAR | Status: AC | PRN
  Administered 2013-03-21: 50 mL via ORAL

## 2013-03-21 MED ORDER — FLUCONAZOLE 150 MG PO TABS: 150.0000 mg | ORAL_TABLET | Freq: Once | ORAL | Status: DC

## 2013-03-21 MED ORDER — AMOXICILLIN-POT CLAVULANATE 875-125 MG PO TABS: 1.0000 | ORAL_TABLET | Freq: Two times a day (BID) | ORAL | Status: DC

## 2013-03-21 MED ORDER — CLINDAMYCIN HCL 150 MG PO CAPS: 300.0000 mg | ORAL_CAPSULE | Freq: Four times a day (QID) | ORAL | Status: DC

## 2013-03-21 MED ORDER — OXYCODONE-ACETAMINOPHEN 5-325 MG PO TABS: 1.0000 | ORAL_TABLET | Freq: Four times a day (QID) | ORAL | Status: DC | PRN

## 2013-03-21 MED ORDER — SODIUM CHLORIDE 0.9 % IV SOLN
Freq: Once | INTRAVENOUS | Status: AC
  Administered 2013-03-21: 13:00:00 via INTRAVENOUS

## 2013-03-21 MED ORDER — ONDANSETRON HCL 4 MG/2ML IJ SOLN
INTRAMUSCULAR | Status: AC
  Administered 2013-03-21: 4 mg via INTRAVENOUS
  Filled 2013-03-21: qty 2

## 2013-03-21 MED ORDER — ONDANSETRON HCL 4 MG/2ML IJ SOLN: 4.0000 mg | Freq: Once | INTRAMUSCULAR | Status: DC

## 2013-03-21 NOTE — ED Notes (Signed)
Redness noted on old abdominal incision site.

## 2013-03-21 NOTE — ED Notes (Signed)
Pt is asking for Rx Diflucan upon d/c.

## 2013-03-21 NOTE — ED Notes (Signed)
Pt returned from CT °

## 2013-03-21 NOTE — ED Notes (Signed)
PO fluids and crackers provided.

## 2013-03-21 NOTE — ED Notes (Signed)
Urinalysis result received from PMD office and given to EDP for review.

## 2013-03-21 NOTE — ED Notes (Signed)
Abdominal pain x 2 days associated with diarrhea.  Seen by PMD PTA and sent to ED for further evaluation.

## 2013-03-21 NOTE — ED Provider Notes (Signed)
CSN: [id number]     Arrival date & time 03/21/13  [id number] History   First MD Initiated Contact with Patient 03/21/13 1001     Chief Complaint  Patient presents with  . Abdominal Pain     (Consider location/radiation/quality/duration/timing/severity/associated sxs/prior Treatment) HPI  This is a 53 year old female with history of primary lateral sclerosis, hyperlipidemia and multiple abdominal surgeries who presents with abdominal pain. Patient reports 2 days of abdominal pain. Initially started in the epigastrium and now radiates across the abdomen and involves the right flank. She states "it feels like I just had surgery." She reports 10 out of 10 pain. She has not taken anything for pain.  She has a history of G-tube that was removed 5 years ago. She reports she subsequently had "abdominal wall cellulitis." Patient denies any fevers. She denies any nausea, vomiting. She did have one episode of diarrhea yesterday. She denies any urinary symptoms. On review of systems, patient does endorse shortness of breath. 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 She denies any cough.  Past Medical History  Diagnosis Date  . Hilda BladesLou Gehrig disease   . Primary lateral sclerosis   . Hyperlipidemia   . G tube feedings    Past Surgical History  Procedure Laterality Date  . Abdominal hysterectomy    . Cholecystectomy    . Appendectomy    . Peg placement    . Neck surgery    . Esophagogastroduodenoscopy N/A 05/19/2012    Procedure: ESOPHAGOGASTRODUODENOSCOPY (EGD);  Surgeon: Theda BelfastPatrick D Hung, MD;  [facility];  Service: Endoscopy;  Laterality: N/A;   No family history on file. History  Substance Use Topics  . Smoking status: Current Every Day Smoker -- 0.50 packs/day    Types: Cigarettes  . Smokeless tobacco: Not on file  . Alcohol Use: No   OB History   Grav Para Term Preterm Abortions TAB SAB Ect Mult Living                 Review of Systems   Constitutional: Negative for fever.  Respiratory: Positive for shortness of breath. Negative for cough and chest tightness.   Cardiovascular: Negative for chest pain.  Gastrointestinal: Positive for abdominal pain and diarrhea. Negative for nausea, vomiting and constipation.  Genitourinary: Positive for flank pain. Negative for dysuria and urgency.  Musculoskeletal: Negative for back pain.  Skin: Negative for wound.  Neurological: Negative for headaches.  Psychiatric/Behavioral: Negative for confusion.  All other systems reviewed and are negative.      Allergies  Dilaudid and Codeine  Home Medications   Current Outpatient Rx  Name  Route  Sig  Dispense  Refill  . amoxicillin-clavulanate (AUGMENTIN) 875-125 MG per tablet   Oral   Take 1 tablet by mouth every 12 (twelve) hours.   14 tablet   0   . beta carotene w/minerals (OCUVITE) tablet   Oral   Take 1 tablet by mouth daily.         Marland Kitchen. buPROPion (WELLBUTRIN XL) 300 MG 24 hr tablet   Oral   Take 300 mg by mouth daily.         . clindamycin (CLEOCIN) 150 MG capsule   Oral   Take 2 capsules (300 mg total) by mouth 4 (four) times daily.   56 capsule   0   . clonazePAM (KLONOPIN) 1 MG tablet   Oral   Take 1 mg by mouth 2 (two) times daily as needed for anxiety.         .Marland Kitchen  cyclobenzaprine (FLEXERIL) 10 MG tablet   Oral   Take 1 tablet (10 mg total) by mouth 2 (two) times daily as needed for muscle spasms.   10 tablet   0   . estradiol (ESTRACE) 0.5 MG tablet   Oral   Take 0.5 mg by mouth daily.         . fluconazole (DIFLUCAN) 150 MG tablet   Oral   Take 1 tablet by mouth daily.         . fluconazole (DIFLUCAN) 150 MG tablet   Oral   Take 1 tablet (150 mg total) by mouth once. If signs or symptoms of yeast infection   1 tablet   0   . HYDROcodone-acetaminophen (NORCO) 5-325 MG per tablet   Oral   Take 1 tablet by mouth every 6 (six) hours as needed.   10 tablet   0   .  HYDROcodone-acetaminophen (NORCO/VICODIN) 5-325 MG per tablet   Oral   Take 1 tablet by mouth every 6 (six) hours as needed for pain.   15 tablet   0   . Lactobacillus (ACIDOPHILUS PO)   Oral   Take 1 tablet by mouth daily.         . Magnesium 250 MG TABS   Oral   Take 1 tablet by mouth daily.         . Multiple Vitamin (MULTIVITAMIN WITH MINERALS) TABS   Oral   Take 1 tablet by mouth daily.         . ondansetron (ZOFRAN) 4 MG tablet   Oral   Take 1 tablet (4 mg total) by mouth every 6 (six) hours.   12 tablet   0   . oxyCODONE-acetaminophen (PERCOCET) 5-325 MG per tablet   Oral   Take 1 tablet by mouth every 6 (six) hours as needed for moderate pain.   20 tablet   0   . oxyCODONE-acetaminophen (PERCOCET/ROXICET) 5-325 MG per tablet   Oral   Take 1-2 tablets by mouth every 6 (six) hours as needed for severe pain.   20 tablet   0   . PREMARIN vaginal cream   Vaginal   Place 1 Applicatorful vaginally as needed.         Marland Kitchen PRESCRIPTION MEDICATION   Inhalation   Inhale 2 puffs into the lungs every 6 (six) hours as needed (Inhaler as needed for shortness of breath).         . SUMAtriptan (IMITREX) 100 MG tablet   Oral   Take 100 mg by mouth as needed for migraine.         . traZODone (DESYREL) 100 MG tablet   Oral   Take 200 mg by mouth at bedtime.          BP 92/53  Pulse 81  Temp(Src) 97.9 F (36.6 C) (Oral)  Resp 16  Ht 5\' 3"  (1.6 m)  Wt 130 lb (58.968 kg)  BMI 23.03 kg/m2  SpO2 100% Physical Exam  Nursing note and vitals reviewed. Constitutional: She is oriented to person, place, and time. No distress.  Uncomfortable appearing  HENT:  Head: Normocephalic and atraumatic.  Eyes: Pupils are equal, round, and reactive to light.  Neck: Neck supple.  Cardiovascular: Normal rate, regular rhythm and normal heart sounds.   No murmur heard. Pulmonary/Chest: Effort normal and breath sounds normal. No respiratory distress. She has no wheezes.   Abdominal: Soft.  Hyperactive bowel sounds, no distention, diffuse tenderness to palpation with voluntary guarding  worse over the epigastrium, mild erythema noted to old G-tube site, no active drainage  Musculoskeletal: She exhibits no edema.  Neurological: She is alert and oriented to person, place, and time.  Skin: Skin is warm and dry.  Psychiatric: She has a normal mood and affect.    ED Course  Procedures (including critical care time) Labs Review Labs Reviewed  CBC WITH DIFFERENTIAL - Abnormal; Notable for the following:    WBC 10.9 (*)    Neutro Abs 8.0 (*)    All other components within normal limits  COMPREHENSIVE METABOLIC PANEL - Abnormal; Notable for the following:    GFR calc non Af Amer 63 (*)    GFR calc Af Amer 73 (*)    All other components within normal limits  LIPASE, BLOOD  I-STAT CG4 LACTIC ACID, ED   Imaging Review Dg Chest 2 View  03/21/2013   CLINICAL DATA:  Shortness of breath.  EXAM: CHEST  2 VIEW  COMPARISON:  January 22, 2013.  FINDINGS: The heart size and mediastinal contours are within normal limits. Both lungs are clear. No pneumothorax or pleural effusion is noted. Status post surgical fusion of lower cervical spine.  IMPRESSION: No acute cardiopulmonary abnormality seen.   Electronically Signed   By: Roque Lias M.D.   On: 03/21/2013 11:58   Ct Abdomen Pelvis W Contrast  03/21/2013   CLINICAL DATA:  Abdominal pain  EXAM: CT ABDOMEN AND PELVIS WITH CONTRAST  TECHNIQUE: Multidetector CT imaging of the abdomen and pelvis was performed using the standard protocol following bolus administration of intravenous contrast.  CONTRAST:  50mL OMNIPAQUE IOHEXOL 300 MG/ML SOLN, OMNIPAQUE IOHEXOL 300 MG/ML SOLN  COMPARISON:  CT 05/19/2012  FINDINGS: Lung bases are clear. 16 mm cyst left lobe liver unchanged. Adjacent 6 mm cyst also noted. Gallbladder surgically absent. Common bile duct nondilated 8 mm. Pancreas is normal. Kidneys show no obstruction or mass.   There is a cutaneous fistula in the epigastrium extending from the skin surface to the gastric antrum. Question drainage from this fistula. This is similar to the prior study and may represent a gastric fistula from prior gastrostomy tube placement. The wall is thickened in shows increased enhancement suggesting inflammation. This could be infected. Note is made of a gastrostomy tube in this location on the CT of 02/26/2009.  Negative for bowel obstruction or bowel thickening.  Small amount of free fluid in the pelvis. Hysterectomy changes. Negative for diverticulitis.  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 This is fluid-filled and correlation with any drainage from the area suggested. Infection possible. This is unchanged from the prior CT.  Cyst in the left lobe of the liver is stable.  Small amount of free fluid in the pelvis.   Electronically Signed   By: Marlan Palau M.D.   On: 03/21/2013 12:03     EKG Interpretation   Date/Time:  Wednesday March 21 2013 10:58:20 EDT Ventricular Rate:  61 PR Interval:  156 QRS Duration: 78 QT Interval:  402 QTC Calculation: 404 R Axis:   77 Text Interpretation:  Normal sinus rhythm Normal ECG Confirmed by Adeel Guiffre   MD, Gearold Wainer ([id number]) on 03/21/2013 11:12:04 AM      MDM   Final diagnoses:  Skin infection   Patient presents with abdominal pain. Similar to pain earlier this year when she had an abdominal wall infection. She is nontoxic on exam. She does have tenderness and mild erythema around the PEG site.  1:00 PM  Patient with continued abdominal pain. Lab workup reassuring including lactate. CT scan shows gastric cutaneous fistula that is fluid-filled. No significant drainage noted on exam. Mild erythema around the PEG tube site. I have reviewed the patient's chart. Patient had a similar read on CT scan in May 2014. She was evaluated by surgery and GI at that time. She had an endoscopy that showed no  evidence of fistula. She was treated with IV antibiotics improvement of her symptoms. Consult to Gen. surgery.  Spoke with Dr. Abbey Chatters. CT grossly unchanged from May 2014.  Patient has mild erythema at that site without drainage. Dr. Abbey Chatters feels she may need exploration but may be done as an outpatient. Patient was given Vanc here.  Trial of outpatient antibiotics with Augmentin and clindamycin. Patient will also be given pain medication. She has a followup appointment at 10:30 AM on Monday. If she has any new or worsening symptoms between now and Monday, she was instructed to give the [facility] for further evaluation.  After history, exam, and medical workup I feel the patient has been appropriately medically screened and is safe for discharge home. Pertinent diagnoses were discussed with the patient. Patient was given return precautions.    Shon Baton, MD 03/21/13 [phone]

## 2013-03-21 NOTE — ED Notes (Signed)
MD at bedside. 

## 2013-03-21 NOTE — ED Notes (Signed)
[facility]

## 2013-03-21 NOTE — Discharge Instructions (Signed)
You were evaluated for possible abdominal wall infection. You have evidence of a pocket that may need drainage as an outpatient.  Your exam is reassuring.  You will be given medications and antibiotics. You should followup with Dr. Abbey Chattersosenbower on Monday.  If you have any fever, worsening abdominal pain, nausea, vomiting, worsening redness of the abdomen or any new or worsening symptoms you need to [facility].

## 2013-03-26 ENCOUNTER — Ambulatory Visit (INDEPENDENT_AMBULATORY_CARE_PROVIDER_SITE_OTHER): Payer: Medicare Other | Admitting: General Surgery

## 2013-03-26 DIAGNOSIS — L03319 Cellulitis of trunk, unspecified: Principal | ICD-10-CM

## 2013-03-26 DIAGNOSIS — L02219 Cutaneous abscess of trunk, unspecified: Secondary | ICD-10-CM

## 2013-03-26 MED ORDER — FLUCONAZOLE 200 MG PO TABS: 200.0000 mg | ORAL_TABLET | Freq: Every day | ORAL | Status: DC

## 2013-03-26 NOTE — Patient Instructions (Signed)
Clean area with Hibiclens soap in the shower once a day for 7 days then once a week for a month.

## 2013-03-26 NOTE — Progress Notes (Signed)
Patient ID: Kayla [name], female   DOB: 08-25-1960, 53 y.o.   MRN: [id number]  Chief Complaint  Patient presents with  . g tube infection    HPI Kayla [name] is a 53 y.o. female.   HPI  She is sent to see yesterday because of a recurrent abdominal wall cellulitis. She was seen by Dr. [name] in May of 2014 for something similar. She had a gastrostomy tube that was removed approximately 4-5 years ago. She developed severe pain last Friday and went to the emergency department. She took some pictures for me. She had some redness and drainage from the site. She was placed on antibiotics and now the redness and pain have resolved. She has developed a vaginal yeast infection because of the antibiotics. CT scan performed last Friday and he may demonstrate what is concerning for possible gastrocutaneous fistula but there is no persistent active drainage from the site. The recent CT scan was unchanged from the previous CT scan.  She took pictures of this site at the time of the infection and has showed them to me.  Past Medical History  Diagnosis Date  . Kayla [name]   . Primary lateral sclerosis   . Hyperlipidemia   . G tube feedings     Past Surgical History  Procedure Laterality Date  . Abdominal hysterectomy    . Cholecystectomy    . Appendectomy    . Peg placement    . Neck surgery    . Esophagogastroduodenoscopy N/A 05/19/2012    Procedure: ESOPHAGOGASTRODUODENOSCOPY (EGD);  Surgeon: Theda Belfast, MD;  Location: [facility] ENDOSCOPY;  Service: Endoscopy;  Laterality: N/A;    No family history on file.  Social History History  Substance Use Topics  . Smoking status: Current Every Day Smoker -- 0.50 packs/day    Types: Cigarettes  . Smokeless tobacco: Not on file  . Alcohol Use: No    Allergies  Allergen Reactions  . Dilaudid [Hydromorphone Hcl] Itching  . Codeine Rash    Current Outpatient Prescriptions  Medication Sig Dispense Refill  . amoxicillin-clavulanate  (AUGMENTIN) 875-125 MG per tablet Take 1 tablet by mouth every 12 (twelve) hours.  14 tablet  0  . beta carotene w/minerals (OCUVITE) tablet Take 1 tablet by mouth daily.      Marland Kitchen buPROPion (WELLBUTRIN XL) 300 MG 24 hr tablet Take 300 mg by mouth daily.      . clindamycin (CLEOCIN) 150 MG capsule Take 2 capsules (300 mg total) by mouth 4 (four) times daily.  56 capsule  0  . clonazePAM (KLONOPIN) 1 MG tablet Take 1 mg by mouth 2 (two) times daily as needed for anxiety.      . cyclobenzaprine (FLEXERIL) 10 MG tablet Take 1 tablet (10 mg total) by mouth 2 (two) times daily as needed for muscle spasms.  10 tablet  0  . estradiol (ESTRACE) 0.5 MG tablet Take 0.5 mg by mouth daily.      . fluconazole (DIFLUCAN) 150 MG tablet Take 1 tablet by mouth daily.      . Lactobacillus (ACIDOPHILUS PO) Take 1 tablet by mouth daily.      . Magnesium 250 MG TABS Take 1 tablet by mouth daily.      . Multiple Vitamin (MULTIVITAMIN WITH MINERALS) TABS Take 1 tablet by mouth daily.      . ondansetron (ZOFRAN) 4 MG tablet Take 1 tablet (4 mg total) by mouth every 6 (six) hours.  12 tablet  0  . oxyCODONE-acetaminophen (  PERCOCET) 5-325 MG per tablet Take 1 tablet by mouth every 6 (six) hours as needed for moderate pain.  20 tablet  0  . oxyCODONE-acetaminophen (PERCOCET/ROXICET) 5-325 MG per tablet Take 1-2 tablets by mouth every 6 (six) hours as needed for severe pain.  20 tablet  0  . PREMARIN vaginal cream Place 1 Applicatorful vaginally as needed.      Marland Kitchen. PRESCRIPTION MEDICATION Inhale 2 puffs into the lungs every 6 (six) hours as needed (Inhaler as needed for shortness of breath).      . SUMAtriptan (IMITREX) 100 MG tablet Take 100 mg by mouth as needed for migraine.      . traZODone (DESYREL) 100 MG tablet Take 200 mg by mouth at bedtime.      . fluconazole (DIFLUCAN) 150 MG tablet Take 1 tablet (150 mg total) by mouth once. If signs or symptoms of yeast infection  1 tablet  0  . fluconazole (DIFLUCAN) 200 MG tablet  Take 1 tablet (200 mg total) by mouth daily.  3 tablet  0  . HYDROcodone-acetaminophen (NORCO) 5-325 MG per tablet Take 1 tablet by mouth every 6 (six) hours as needed.  10 tablet  0  . HYDROcodone-acetaminophen (NORCO/VICODIN) 5-325 MG per tablet Take 1 tablet by mouth every 6 (six) hours as needed for pain.  15 tablet  0   No current facility-administered medications for this visit.    Review of Systems Review of Systems  Constitutional: Negative for fever.  Gastrointestinal: Positive for diarrhea. Negative for abdominal pain.  Genitourinary:       Vagina yeast infection    There were no vitals taken for this visit.  Physical Exam Physical Exam  Constitutional: She appears well-developed and well-nourished. No distress.  HENT:  Head: Normocephalic and atraumatic.  Abdominal: Soft. She exhibits no distension and no mass. There is no tenderness.  There is a contented scar present in the epigastric area. There is no erythema or drainage from it. There is no induration. There is no tenderness around it. A small subumbilical scar is noted.    Data Reviewed Previous notes in EPIC. CT scans.  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 CT scan suggests a gastrocutaneous fistula however no contrast leaks from the stomach into the track. Upper endoscopy did not demonstrate a persistent defect at the site of the PEG. She has responded well to the antibiotics and the infection has resolved clinically. She does have a vaginal yeast infection secondary to the antibiotics.     Plan    Diflucan for the vaginal yeast infection. I've given her some wound care cleansing instructions. If this recurs again, I think we need to visit taking down the gastrostomy tube from the abdominal wall as well as excising the tract. 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  I have told her to call back if she has a recurrent infection.        Vinetta Brach J 03/26/2013, 11:17 AM

## 2013-04-09 ENCOUNTER — Ambulatory Visit: Payer: Medicare Other | Admitting: Neurology

## 2013-08-07 ENCOUNTER — Other Ambulatory Visit (INDEPENDENT_AMBULATORY_CARE_PROVIDER_SITE_OTHER): Payer: Self-pay | Admitting: General Surgery

## 2013-08-08 ENCOUNTER — Telehealth: Payer: Self-pay | Admitting: Radiology

## 2013-08-21 ENCOUNTER — Ambulatory Visit: Payer: Medicare Other | Admitting: Neurology

## 2014-05-27 ENCOUNTER — Other Ambulatory Visit (HOSPITAL_BASED_OUTPATIENT_CLINIC_OR_DEPARTMENT_OTHER): Payer: Self-pay

## 2014-05-27 ENCOUNTER — Observation Stay (HOSPITAL_BASED_OUTPATIENT_CLINIC_OR_DEPARTMENT_OTHER)
Admission: EM | Admit: 2014-05-27 | Discharge: 2014-05-31 | Disposition: A | Discharge status: Home / Self Care | Payer: Medicare Other | Attending: Internal Medicine | Admitting: Internal Medicine

## 2014-05-27 ENCOUNTER — Emergency Department (HOSPITAL_BASED_OUTPATIENT_CLINIC_OR_DEPARTMENT_OTHER): Payer: Medicare Other

## 2014-05-27 ENCOUNTER — Encounter (HOSPITAL_BASED_OUTPATIENT_CLINIC_OR_DEPARTMENT_OTHER): Payer: Self-pay | Admitting: Emergency Medicine

## 2014-05-27 DIAGNOSIS — F17210 Nicotine dependence, cigarettes, uncomplicated: Secondary | ICD-10-CM | POA: Diagnosis not present

## 2014-05-27 DIAGNOSIS — I959 Hypotension, unspecified: Secondary | ICD-10-CM | POA: Insufficient documentation

## 2014-05-27 DIAGNOSIS — R519 Headache, unspecified: Secondary | ICD-10-CM | POA: Diagnosis present

## 2014-05-27 DIAGNOSIS — E785 Hyperlipidemia, unspecified: Secondary | ICD-10-CM | POA: Insufficient documentation

## 2014-05-27 DIAGNOSIS — G43909 Migraine, unspecified, not intractable, without status migrainosus: Secondary | ICD-10-CM | POA: Insufficient documentation

## 2014-05-27 DIAGNOSIS — R51 Headache: Secondary | ICD-10-CM

## 2014-05-27 DIAGNOSIS — R531 Weakness: Secondary | ICD-10-CM | POA: Diagnosis not present

## 2014-05-27 DIAGNOSIS — R197 Diarrhea, unspecified: Secondary | ICD-10-CM | POA: Insufficient documentation

## 2014-05-27 DIAGNOSIS — Z90710 Acquired absence of both cervix and uterus: Secondary | ICD-10-CM | POA: Insufficient documentation

## 2014-05-27 DIAGNOSIS — Z885 Allergy status to narcotic agent status: Secondary | ICD-10-CM | POA: Diagnosis not present

## 2014-05-27 DIAGNOSIS — F4310 Post-traumatic stress disorder, unspecified: Secondary | ICD-10-CM | POA: Insufficient documentation

## 2014-05-27 DIAGNOSIS — F4323 Adjustment disorder with mixed anxiety and depressed mood: Secondary | ICD-10-CM | POA: Clinically undetermined

## 2014-05-27 DIAGNOSIS — E876 Hypokalemia: Secondary | ICD-10-CM | POA: Insufficient documentation

## 2014-05-27 LAB — COMPREHENSIVE METABOLIC PANEL
ALBUMIN: 4.4 g/dL (ref 3.5–5.0)
ALT: 21 U/L (ref 14–54)
AST: 21 U/L (ref 15–41)
Alkaline Phosphatase: 67 U/L (ref 38–126)
Anion gap: 10 (ref 5–15)
BUN: 7 mg/dL (ref 6–20)
CALCIUM: 9.6 mg/dL (ref 8.9–10.3)
CO2: 24 mmol/L (ref 22–32)
CREATININE: 1.08 mg/dL — AB (ref 0.44–1.00)
Chloride: 104 mmol/L (ref 101–111)
GFR, EST NON AFRICAN AMERICAN: 57 mL/min — AB (ref >60)
GLUCOSE: 120 mg/dL — AB (ref 65–99)
POTASSIUM: 3.4 mmol/L — AB (ref 3.5–5.1)
Sodium: 138 mmol/L (ref 135–145)
TOTAL PROTEIN: 8.1 g/dL (ref 6.5–8.1)
Total Bilirubin: 0.4 mg/dL (ref 0.3–1.2)

## 2014-05-27 LAB — URINALYSIS, ROUTINE W REFLEX MICROSCOPIC
Bilirubin Urine: NEGATIVE
GLUCOSE, UA: NEGATIVE mg/dL
HGB URINE DIPSTICK: NEGATIVE
Ketones, ur: NEGATIVE mg/dL
LEUKOCYTES UA: NEGATIVE
NITRITE: NEGATIVE
Protein, ur: NEGATIVE mg/dL
Specific Gravity, Urine: 1.003 — ABNORMAL LOW (ref 1.005–1.030)
Urobilinogen, UA: 0.2 mg/dL (ref 0.0–1.0)
pH: 7 (ref 5.0–8.0)

## 2014-05-27 LAB — CBC WITH DIFFERENTIAL/PLATELET
Basophils Absolute: 0 10*3/uL (ref 0.0–0.1)
Basophils Relative: 0 % (ref 0–1)
EOS PCT: 2 % (ref 0–5)
Eosinophils Absolute: 0.2 10*3/uL (ref 0.0–0.7)
HCT: 41.4 % (ref 36.0–46.0)
Hemoglobin: 13.9 g/dL (ref 12.0–15.0)
Lymphocytes Relative: 32 % (ref 12–46)
Lymphs Abs: 3.1 10*3/uL (ref 0.7–4.0)
MCH: 29.4 pg (ref 26.0–34.0)
MCHC: 33.6 g/dL (ref 30.0–36.0)
MCV: 87.5 fL (ref 78.0–100.0)
Monocytes Absolute: 0.8 10*3/uL (ref 0.1–1.0)
Monocytes Relative: 8 % (ref 3–12)
Neutro Abs: 5.5 10*3/uL (ref 1.7–7.7)
Neutrophils Relative %: 58 % (ref 43–77)
Platelets: 257 10*3/uL (ref 150–400)
RBC: 4.73 MIL/uL (ref 3.87–5.11)
RDW: 12.7 % (ref 11.5–15.5)
WBC: 9.7 10*3/uL (ref 4.0–10.5)

## 2014-05-27 LAB — ETHANOL: Alcohol, Ethyl (B): <5 mg/dL (ref <5)

## 2014-05-27 NOTE — ED Notes (Signed)
Patient reports numbness on the left side of the body which began around 1900 tonight.  Patient reports dizziness

## 2014-05-27 NOTE — ED Provider Notes (Signed)
CSN: [id number]     Arrival date & time 05/27/14  2203 History  This chart was scribed for No att. providers found by Abel Presto, ED Scribe. This patient was seen in room MHT14/MHT14 and the patient's care was started at 10:30 PM.    Chief Complaint  Patient presents with  . Numbness    The history is provided by the patient and the spouse. No language interpreter was used.   HPI Comments: Kayla [name] is a 54 y.o. 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 Pt states she felt dizzy at onset and dropped the fork and knife she was holding. She does not remember what happened afterwards. Husband states pt slid to the floor. Husband is unsure if pt hit her head. Pt notes associated neck soreness. He notes left sided foot drop. He reports another incident earlier this evening. He denies LOC. Pt notes associated fatigue. She notes intermittent dizziness for several weeks but denies similar episodes in the past. She reports some left sided weakness at baseline. Pt denies confusion but reports some memory loss. Symptoms began at 7 PM. Patient reports chronic left-sided weakness, but this is much worse currently than usual.  Past Medical History  Diagnosis Date  . [name]   . Primary lateral sclerosis   . Hyperlipidemia   . G tube feedings    Past Surgical History  Procedure Laterality Date  . Abdominal hysterectomy    . Cholecystectomy    . Appendectomy    . Peg placement    . Neck surgery    . Esophagogastroduodenoscopy N/A 05/19/2012    Procedure: ESOPHAGOGASTRODUODENOSCOPY (EGD);  Surgeon: Theda Belfast, MD;  Location: [facility] ENDOSCOPY;  Service: Endoscopy;  Laterality: N/A;   History reviewed. No pertinent family history. History  Substance Use Topics  . Smoking status: Current Every Day Smoker -- 0.50 packs/day    Types: Cigarettes  . Smokeless  tobacco: Not on file  . Alcohol Use: No   OB History    No data available     Review of Systems  Musculoskeletal: Positive for neck pain.  Neurological: Positive for dizziness, weakness and numbness. Negative for syncope.  Psychiatric/Behavioral: Negative for confusion.  All other systems reviewed and are negative.     Allergies  Dilaudid and Codeine  Home Medications   Prior to Admission medications   Medication Sig Start Date End Date Taking? Authorizing Provider  amoxicillin-clavulanate (AUGMENTIN) 875-125 MG per tablet Take 1 tablet by mouth every 12 (twelve) hours. 03/21/13   Shon Baton, MD  beta carotene w/minerals (OCUVITE) tablet Take 1 tablet by mouth daily.    Historical Provider, MD  buPROPion (WELLBUTRIN XL) 300 MG 24 hr tablet Take 300 mg by mouth daily.    Historical Provider, MD  clindamycin (CLEOCIN) 150 MG capsule Take 2 capsules (300 mg total) by mouth 4 (four) times daily. 03/21/13   Shon Baton, MD  clonazePAM (KLONOPIN) 1 MG tablet Take 1 mg by mouth 2 (two) times daily as needed for anxiety.    Historical Provider, MD  cyclobenzaprine (FLEXERIL) 10 MG tablet Take 1 tablet (10 mg total) by mouth 2 (two) times daily as needed for muscle spasms. 01/22/13   Toy Cookey, MD  estradiol (ESTRACE) 0.5 MG tablet Take 0.5 mg by mouth daily.    Historical Provider, MD  fluconazole (DIFLUCAN) 150 MG tablet  Take 1 tablet by mouth daily. 12/01/12   Historical Provider, MD  fluconazole (DIFLUCAN) 150 MG tablet Take 1 tablet (150 mg total) by mouth once. If signs or symptoms of yeast infection 03/21/13   Shon Baton, MD  fluconazole (DIFLUCAN) 200 MG tablet Take 1 tablet (200 mg total) by mouth daily. 03/26/13   Avel Peace, MD  HYDROcodone-acetaminophen (NORCO) 5-325 MG per tablet Take 1 tablet by mouth every 6 (six) hours as needed. 01/22/13   Toy Cookey, MD  HYDROcodone-acetaminophen (NORCO/VICODIN) 5-325 MG per tablet Take 1 tablet by mouth every 6  (six) hours as needed for pain. 08/08/12   Derwood Kaplan, MD  Lactobacillus (ACIDOPHILUS PO) Take 1 tablet by mouth daily.    Historical Provider, MD  Magnesium 250 MG TABS Take 1 tablet by mouth daily.    Historical Provider, MD  Multiple Vitamin (MULTIVITAMIN WITH MINERALS) TABS Take 1 tablet by mouth daily.    Historical Provider, MD  ondansetron (ZOFRAN) 4 MG tablet Take 1 tablet (4 mg total) by mouth every 6 (six) hours. 03/21/13   Shon Baton, MD  oxyCODONE-acetaminophen (PERCOCET) 5-325 MG per tablet Take 1 tablet by mouth every 6 (six) hours as needed for moderate pain. 02/24/13   Purvis Sheffield, MD  oxyCODONE-acetaminophen (PERCOCET/ROXICET) 5-325 MG per tablet Take 1-2 tablets by mouth every 6 (six) hours as needed for severe pain. 03/21/13   Shon Baton, MD  PREMARIN vaginal cream Place 1 Applicatorful vaginally as needed. 11/21/12   Historical Provider, MD  PRESCRIPTION MEDICATION Inhale 2 puffs into the lungs every 6 (six) hours as needed (Inhaler as needed for shortness of breath).    Historical Provider, MD  SUMAtriptan (IMITREX) 100 MG tablet Take 100 mg by mouth as needed for migraine.    Historical Provider, MD  traZODone (DESYREL) 100 MG tablet Take 200 mg by mouth at bedtime.    Historical Provider, MD   BP 119/80 mmHg  Pulse 78  Temp(Src) 98 F (36.7 C) (Oral)  Resp 18  Ht  (1.6 m)  Wt 150 lb (68.04 kg)  BMI 26.58 kg/m2  SpO2 98% Physical Exam  Constitutional: She is oriented to person, place, and time. She appears well-developed and well-nourished.  HENT:  Head: Normocephalic and atraumatic.  Neck:  Mild diffuse C-spine tenderness.  Cardiovascular: Normal rate and regular rhythm.   No murmur heard. Pulmonary/Chest: Effort normal and breath sounds normal. No respiratory distress.  Abdominal: Soft. There is no tenderness. There is no rebound and no guarding.  Musculoskeletal: She exhibits no edema or tenderness.  Neurological: She is alert and  oriented to person, place, and time. No cranial nerve deficit.  Mild weakness of the right upper and right lower extremity. Sensation to light touch intact in all 4 extremities. Pronator drift on the left. 3 out of 5 strength on the left hand with grip. Weakly wiggles toes on the left lower extremity, cannot lift the left lower extremity off the bed.  Skin: Skin is warm and dry.  Psychiatric:  Flat affect  Nursing note and vitals reviewed.   ED Course  Procedures (including critical care time) DIAGNOSTIC STUDIES: Oxygen Saturation is 98% on room air, normal by my interpretation.    COORDINATION OF CARE: 10:40 PM Discussed treatment plan with patient at beside, the patient agrees with the plan and has no further questions at this time.   Labs Review Labs Reviewed  URINALYSIS, ROUTINE W REFLEX MICROSCOPIC - Abnormal; Notable for the following:  Specific Gravity, Urine 1.003 (*)    All other components within normal limits  COMPREHENSIVE METABOLIC PANEL - Abnormal; Notable for the following:    Potassium 3.4 (*)    Glucose, Bld 120 (*)    Creatinine, Ser 1.08 (*)    GFR calc non Af Amer 57 (*)    All other components within normal limits  ETHANOL  CBC WITH DIFFERENTIAL/PLATELET    Imaging Review Ct Head Wo Contrast  05/27/2014   CLINICAL DATA:  Code stroke for left-sided numbness and 7 p.m. Dizziness and headache.  EXAM: CT HEAD WITHOUT CONTRAST  TECHNIQUE: Contiguous axial images were obtained from the base of the skull through the vertex without intravenous contrast.  COMPARISON:  No comparison head CT available.  Brain MRI 12/20/2012  FINDINGS: Skull and Sinuses:Negative for fracture or destructive process. The mastoids, middle ears, and imaged paranasal sinuses are clear.  Orbits: No acute abnormality.  Brain: No evidence of acute infarction, hemorrhage, hydrocephalus, or mass lesion/mass effect.  These results were called by telephone at the time of interpretation on 05/27/2014 at  10:32 pm to Dr. Tilden FossaELIZABETH [place] Surges , who verbally acknowledged these results.  IMPRESSION: Negative head CT.   Electronically Signed   By: Marnee SpringJonathon  Watts M.D.   On: 05/27/2014 22:33     EKG Interpretation None      MDM   Final diagnoses:  Weakness   Patient with history of muscle weakness condition that she cannot name, [facility] but [facility] are not available at this time. On review through care everywhere, [facility] and has a history of functional weakness [facility]. Patient is considerably weak on examination, greatest in the left lower extremity. Discussed with Dr. Thad Rangereynolds with neurology, code stroke was not activated given complex neurologic history. CT C-spine obtained given patient's history of fall 2, history is very vague as to whether or not she sustained a significant head injury. Discussed with hospitalist services can regarding admission for further workup.  I personally performed the services described in this documentation, which was scribed in my presence. The recorded information has been reviewed and is accurate.     Tilden FossaElizabeth Clare Casto, MD 05/28/14 [phone]

## 2014-05-28 ENCOUNTER — Encounter (HOSPITAL_COMMUNITY): Payer: Self-pay | Admitting: Internal Medicine

## 2014-05-28 ENCOUNTER — Observation Stay (HOSPITAL_COMMUNITY): Payer: Medicare Other

## 2014-05-28 DIAGNOSIS — R51 Headache: Secondary | ICD-10-CM | POA: Diagnosis not present

## 2014-05-28 DIAGNOSIS — M6289 Other specified disorders of muscle: Secondary | ICD-10-CM | POA: Diagnosis not present

## 2014-05-28 DIAGNOSIS — R531 Weakness: Secondary | ICD-10-CM | POA: Diagnosis present

## 2014-05-28 DIAGNOSIS — R519 Headache, unspecified: Secondary | ICD-10-CM | POA: Diagnosis present

## 2014-05-28 LAB — CBC WITH DIFFERENTIAL/PLATELET
Basophils Absolute: 0 10*3/uL (ref 0.0–0.1)
Basophils Relative: 0 % (ref 0–1)
EOS PCT: 2 % (ref 0–5)
Eosinophils Absolute: 0.2 10*3/uL (ref 0.0–0.7)
HCT: 36.9 % (ref 36.0–46.0)
Hemoglobin: 12.7 g/dL (ref 12.0–15.0)
LYMPHS ABS: 2.6 10*3/uL (ref 0.7–4.0)
Lymphocytes Relative: 29 % (ref 12–46)
MCH: 29.8 pg (ref 26.0–34.0)
MCHC: 34.4 g/dL (ref 30.0–36.0)
MCV: 86.6 fL (ref 78.0–100.0)
MONO ABS: 0.6 10*3/uL (ref 0.1–1.0)
Monocytes Relative: 7 % (ref 3–12)
NEUTROS ABS: 5.4 10*3/uL (ref 1.7–7.7)
Neutrophils Relative %: 62 % (ref 43–77)
Platelets: 241 10*3/uL (ref 150–400)
RBC: 4.26 MIL/uL (ref 3.87–5.11)
RDW: 13 % (ref 11.5–15.5)
WBC: 8.8 10*3/uL (ref 4.0–10.5)

## 2014-05-28 LAB — LIPID PANEL
Cholesterol: 196 mg/dL (ref 0–200)
HDL: 59 mg/dL (ref >40)
LDL Cholesterol: 112 mg/dL — ABNORMAL HIGH (ref 0–99)
Total CHOL/HDL Ratio: 3.3 RATIO
Triglycerides: 123 mg/dL (ref <150)
VLDL: 25 mg/dL (ref 0–40)

## 2014-05-28 LAB — COMPREHENSIVE METABOLIC PANEL
ALT: 17 U/L (ref 14–54)
AST: 19 U/L (ref 15–41)
Albumin: 3.8 g/dL (ref 3.5–5.0)
Alkaline Phosphatase: 55 U/L (ref 38–126)
Anion gap: 10 (ref 5–15)
BILIRUBIN TOTAL: 0.6 mg/dL (ref 0.3–1.2)
CHLORIDE: 105 mmol/L (ref 101–111)
CO2: 23 mmol/L (ref 22–32)
CREATININE: 1.02 mg/dL — AB (ref 0.44–1.00)
Calcium: 9 mg/dL (ref 8.9–10.3)
GFR calc non Af Amer: >60 mL/min (ref >60)
Glucose, Bld: 86 mg/dL (ref 65–99)
Potassium: 3.5 mmol/L (ref 3.5–5.1)
Sodium: 138 mmol/L (ref 135–145)
Total Protein: 6.5 g/dL (ref 6.5–8.1)

## 2014-05-28 LAB — RAPID URINE DRUG SCREEN, HOSP PERFORMED
Amphetamines: NOT DETECTED
BENZODIAZEPINES: NOT DETECTED
Barbiturates: NOT DETECTED
Cocaine: NOT DETECTED
OPIATES: NOT DETECTED
Tetrahydrocannabinol: NOT DETECTED

## 2014-05-28 LAB — GLUCOSE, CAPILLARY: GLUCOSE-CAPILLARY: 83 mg/dL (ref 65–99)

## 2014-05-28 LAB — TSH: TSH: 2 u[IU]/mL (ref 0.350–4.500)

## 2014-05-28 MED ORDER — TRAMADOL HCL 50 MG PO TABS
50.0000 mg | ORAL_TABLET | Freq: Four times a day (QID) | ORAL | Status: DC | PRN
  Administered 2014-05-28 (×2): 50 mg via ORAL
  Filled 2014-05-28 (×2): qty 1

## 2014-05-28 MED ORDER — TOPIRAMATE 25 MG PO TABS
25.0000 mg | ORAL_TABLET | Freq: Two times a day (BID) | ORAL | Status: DC
  Administered 2014-05-28 – 2014-05-31 (×6): 25 mg via ORAL
  Filled 2014-05-28 (×8): qty 1

## 2014-05-28 MED ORDER — STROKE: EARLY STAGES OF RECOVERY BOOK
Freq: Once | Status: AC
  Administered 2014-05-28: 1
  Filled 2014-05-28: qty 1

## 2014-05-28 MED ORDER — SUMATRIPTAN SUCCINATE 100 MG PO TABS
100.0000 mg | ORAL_TABLET | ORAL | Status: DC | PRN
  Administered 2014-05-28 – 2014-05-31 (×5): 100 mg via ORAL
  Filled 2014-05-28 (×6): qty 1

## 2014-05-28 MED ORDER — BUPROPION HCL ER (XL) 300 MG PO TB24
300.0000 mg | ORAL_TABLET | Freq: Every day | ORAL | Status: DC
  Administered 2014-05-28 – 2014-05-31 (×4): 300 mg via ORAL
  Filled 2014-05-28 (×4): qty 1

## 2014-05-28 MED ORDER — TRAMADOL HCL 50 MG PO TABS
100.0000 mg | ORAL_TABLET | Freq: Four times a day (QID) | ORAL | Status: DC | PRN
  Administered 2014-05-29 (×2): 100 mg via ORAL
  Filled 2014-05-28 (×2): qty 2

## 2014-05-28 MED ORDER — TRAMADOL HCL 50 MG PO TABS
50.0000 mg | ORAL_TABLET | Freq: Once | ORAL | Status: AC
  Administered 2014-05-28: 50 mg via ORAL
  Filled 2014-05-28: qty 1

## 2014-05-28 MED ORDER — EZETIMIBE 10 MG PO TABS
10.0000 mg | ORAL_TABLET | Freq: Every day | ORAL | Status: DC
  Administered 2014-05-28 – 2014-05-31 (×4): 10 mg via ORAL
  Filled 2014-05-28 (×4): qty 1

## 2014-05-28 MED ORDER — SODIUM CHLORIDE 0.9 % IV SOLN
INTRAVENOUS | Status: AC
  Administered 2014-05-28 (×2): 1000 mL via INTRAVENOUS

## 2014-05-28 MED ORDER — TRAZODONE HCL 100 MG PO TABS
100.0000 mg | ORAL_TABLET | Freq: Every day | ORAL | Status: DC
  Administered 2014-05-28 – 2014-05-30 (×3): 100 mg via ORAL
  Filled 2014-05-28 (×4): qty 2

## 2014-05-28 MED ORDER — CLONAZEPAM 1 MG PO TABS
1.0000 mg | ORAL_TABLET | Freq: Two times a day (BID) | ORAL | Status: DC
  Administered 2014-05-28 – 2014-05-31 (×7): 1 mg via ORAL
  Filled 2014-05-28 (×7): qty 1

## 2014-05-28 MED ORDER — IOHEXOL 350 MG/ML SOLN
50.0000 mL | Freq: Once | INTRAVENOUS | Status: AC | PRN
  Administered 2014-05-28: 50 mL via INTRAVENOUS

## 2014-05-28 MED ORDER — ENOXAPARIN SODIUM 40 MG/0.4ML ~~LOC~~ SOLN
40.0000 mg | SUBCUTANEOUS | Status: DC
  Administered 2014-05-28 – 2014-05-31 (×5): 40 mg via SUBCUTANEOUS
  Filled 2014-05-28 (×5): qty 0.4

## 2014-05-28 NOTE — Progress Notes (Signed)
Patient is experiencing some emotional distress and is crying, I gave her emotional support and sat with her for a few minutes and she seems to be doing better. Husband left to go home and take care of some things but will return this morning. Will continue to support and monitor.

## 2014-05-28 NOTE — Progress Notes (Signed)
PT Cancellation Note  Patient Details Name: Kayla [name] MRN: [id number] DOB: [birth year]   Cancelled Treatment:    Reason Eval/Treat Not Completed: Patient at procedure or test/unavailable (pt getting IV placed, then going to CT. Will attempt tomorrow. )   Kayla [name], Kayla [name] 05/28/2014, 1:35 PM [phone]

## 2014-05-28 NOTE — H&P (Signed)
[facility] History and Physical  Kayla [name] ZOX:[id number] DOB: October 25, 1960 DOA: 05/27/2014  Referring physician: Patient was transferred from [facility]. Dr. Madilyn Hook. PCP: Caffie Damme, MD  Specialists: Patient has neurologist in [place].  Chief Complaint: Left-sided weakness.  HPI: Kayla [name] is a 54 y.o. female with history of migraine and previous diagnosis of ALS which was presently rediagnosed as possible functional neurological disorder was brought to the ER after patient was found to have left-sided weakness. Patient states last evening around 7 PM while was preparing dinner she suddenly started developing left-sided weakness and headache and lost control of full left side. In the ER CT head and C-spine were unremarkable and patient was brought to [facility] for further workup. Patient also has been having some frontal headaches since last evening. Denies any blurred vision difficulty speaking swallowing. Neurologist on call Dr. Thad Ranger has been consulted. Patient denies any chest pain or shortness of breath.   Review of Systems: As presented in the history of presenting illness, rest negative.  Past Medical History  Diagnosis Date  . [name]   . Primary lateral sclerosis   . Hyperlipidemia   . G tube feedings    Past Surgical History  Procedure Laterality Date  . Abdominal hysterectomy    . Cholecystectomy    . Appendectomy    . Peg placement    . Neck surgery    . Esophagogastroduodenoscopy N/A 05/19/2012    Procedure: ESOPHAGOGASTRODUODENOSCOPY (EGD);  Surgeon: Theda Belfast, MD;  Location: [facility] ENDOSCOPY;  Service: Endoscopy;  Laterality: N/A;   Social History:  reports that she has been smoking Cigarettes.  She has been smoking about 0.50 packs per day. She does not have any smokeless tobacco history on file. She reports that she does not drink alcohol or use illicit drugs. Where does patient live home. Can patient participate in ADLs?  Yes.  Allergies  Allergen Reactions  . Dilaudid [Hydromorphone Hcl] Itching  . Codeine Rash    Family History:  Family History  Problem Relation Age of Onset  . Hypertension Brother       Prior to Admission medications   Medication Sig Start Date End Date Taking? Authorizing Provider  buPROPion (WELLBUTRIN XL) 300 MG 24 hr tablet Take 300 mg by mouth daily.   Yes Historical Provider, MD  clonazePAM (KLONOPIN) 1 MG tablet Take 1 mg by mouth 2 (two) times daily.    Yes Historical Provider, MD  estradiol (ESTRACE) 1 MG tablet Take 1 mg by mouth daily.   Yes Historical Provider, MD  ezetimibe (ZETIA) 10 MG tablet Take 10 mg by mouth daily.   Yes Historical Provider, MD  oxyCODONE-acetaminophen (PERCOCET/ROXICET) 5-325 MG per tablet Take 1 tablet by mouth 2 (two) times daily as needed for severe pain.   Yes Historical Provider, MD  topiramate (TOPAMAX) 50 MG tablet Take 25 mg by mouth 2 (two) times daily.   Yes Historical Provider, MD  traZODone (DESYREL) 100 MG tablet Take 100-200 mg by mouth at bedtime.    Yes Historical Provider, MD  amoxicillin-clavulanate (AUGMENTIN) 875-125 MG per tablet Take 1 tablet by mouth every 12 (twelve) hours. Patient not taking: Reported on 05/28/2014 03/21/13   Shon Baton, MD  clindamycin (CLEOCIN) 150 MG capsule Take 2 capsules (300 mg total) by mouth 4 (four) times daily. Patient not taking: Reported on 05/28/2014 03/21/13   Shon Baton, MD  cyclobenzaprine (FLEXERIL) 10 MG tablet Take 1 tablet (10 mg total) by  mouth 2 (two) times daily as needed for muscle spasms. Patient not taking: Reported on 05/28/2014 01/22/13   Toy CookeyMegan Docherty, MD  fluconazole (DIFLUCAN) 150 MG tablet Take 1 tablet (150 mg total) by mouth once. If signs or symptoms of yeast infection Patient not taking: Reported on 05/28/2014 03/21/13   Shon Batonourtney F Horton, MD  fluconazole (DIFLUCAN) 200 MG tablet Take 1 tablet (200 mg total) by mouth daily. Patient not taking: Reported on  05/28/2014 03/26/13   Avel Peaceodd Rosenbower, MD  HYDROcodone-acetaminophen [facility](NORCO) 5-325 MG per tablet Take 1 tablet by mouth every 6 (six) hours as needed. Patient not taking: Reported on 05/28/2014 01/22/13   Toy CookeyMegan Docherty, MD  HYDROcodone-acetaminophen (NORCO/VICODIN) 5-325 MG per tablet Take 1 tablet by mouth every 6 (six) hours as needed for pain. Patient not taking: Reported on 05/28/2014 08/08/12   Derwood KaplanAnkit Nanavati, MD  ondansetron (ZOFRAN) 4 MG tablet Take 1 tablet (4 mg total) by mouth every 6 (six) hours. Patient not taking: Reported on 05/28/2014 03/21/13   Shon Batonourtney F Horton, MD  oxyCODONE-acetaminophen (PERCOCET) 5-325 MG per tablet Take 1 tablet by mouth every 6 (six) hours as needed for moderate pain. Patient not taking: Reported on 05/28/2014 02/24/13   Purvis SheffieldForrest Harrison, MD  oxyCODONE-acetaminophen (PERCOCET/ROXICET) 5-325 MG per tablet Take 1-2 tablets by mouth every 6 (six) hours as needed for severe pain. Patient not taking: Reported on 05/28/2014 03/21/13   Shon Batonourtney F Horton, MD    Physical Exam: Filed Vitals:   05/27/14 2349 05/28/14 0143 05/28/14 0400 05/28/14 0410  BP: 120/67 118/49 83/65 102/59  Pulse: 78 70 80   Temp:  97.9 F (36.6 C) 98.4 F (36.9 C)   TempSrc:  Oral Oral   Resp: 20 16 15    Height:      Weight:      SpO2: 100% 99% 95%      General:  Well-developed and nourished.  Eyes: Anicteric no pallor.  ENT: No discharge from ears eyes nose and mouth.  Neck: No mass felt.  Cardiovascular: S1 and S2 heard.  Respiratory: No rhonchi or crepitations.  Abdomen: Soft nontender bowel sounds present.  Skin: No rash.  Musculoskeletal: No edema.  Psychiatric: Appears normal.  Neurologic: Alert awake oriented to time place and person. Patient is unable to move her left upper and lower extremities. No facial asymmetry. No facial asymmetry. Tongue is midline.  Labs on Admission:  Basic Metabolic Panel:  Recent Labs Lab 05/27/14 2226  NA 138  K 3.4*  CL 104   CO2 24  GLUCOSE 120*  BUN 7  CREATININE 1.08*  CALCIUM 9.6   Liver Function Tests:  Recent Labs Lab 05/27/14 2226  AST 21  ALT 21  ALKPHOS 67  BILITOT 0.4  PROT 8.1  ALBUMIN 4.4   No results for input(s): LIPASE, AMYLASE in the last 168 hours. No results for input(s): AMMONIA in the last 168 hours. CBC:  Recent Labs Lab 05/27/14 2226  WBC 9.7  NEUTROABS 5.5  HGB 13.9  HCT 41.4  MCV 87.5  PLT 257   Cardiac Enzymes: No results for input(s): CKTOTAL, CKMB, CKMBINDEX, TROPONINI in the last 168 hours.  BNP (last 3 results) No results for input(s): BNP in the last 8760 hours.  ProBNP (last 3 results) No results for input(s): PROBNP in the last 8760 hours.  CBG: No results for input(s): GLUCAP in the last 168 hours.  Radiological Exams on Admission: Ct Head Wo Contrast  05/27/2014   CLINICAL DATA:  Code stroke for  left-sided numbness and 7 p.m. Dizziness and headache.  EXAM: CT HEAD WITHOUT CONTRAST  TECHNIQUE: Contiguous axial images were obtained from the base of the skull through the vertex without intravenous contrast.  COMPARISON:  No comparison head CT available.  Brain MRI 12/20/2012  FINDINGS: Skull and Sinuses:Negative for fracture or destructive process. The mastoids, middle ears, and imaged paranasal sinuses are clear.  Orbits: No acute abnormality.  Brain: No evidence of acute infarction, hemorrhage, hydrocephalus, or mass lesion/mass effect.  These results were called by telephone at the time of interpretation on 05/27/2014 at 10:32 pm to Dr. Tilden Fossa , who verbally acknowledged these results.  IMPRESSION: Negative head CT.   Electronically Signed   By: Marnee Spring M.D.   On: 05/27/2014 22:33   Ct Cervical Spine Wo Contrast  05/27/2014   CLINICAL DATA:  Left-sided numbness and 7 p.m. Headache, dizziness, stable episode.  EXAM: CT CERVICAL SPINE WITHOUT CONTRAST  TECHNIQUE: Multidetector CT imaging of the cervical spine was performed without  intravenous contrast. Multiplanar CT image reconstructions were also generated.  COMPARISON:  Head CT- earlier same day  FINDINGS: C1 to the superior endplate of T2 is imaged.  There is straightening of the expected cervical lordosis. No anterolisthesis or retrolisthesis. The dens is normally positioned and a lateral masses of C1. Normal atlantodental and atlantoaxial articulations. The bilateral facets are normally aligned.  Post C5-C6 ACDF and interbody fusion without evidence of hardware failure or loosening.  Cervical vertebral body heights are preserved. Prevertebral soft tissues are normal.  There is mild multilevel cervical spine DDD, worse at C4-C5 and C6-C7 with disc space height loss, endplate irregularity and sclerosis.  Scattered shotty bilateral cervical lymph nodes are individually not enlarged by size criteria. Normal noncontrast appearance of the thyroid gland. Limited visualization of lung apices is normal.  IMPRESSION: 1. No fracture or static subluxation of the cervical spine. 2. Post C5-C6 ACDF and interbody fusion without evidence of hardware failure or loosening. 3. 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   Electronically Signed   By: Simonne Come M.D.   On: 05/27/2014 23:40    EKG: Independently reviewed. Normal sinus rhythm.  Assessment/Plan Principal Problem:   Left-sided weakness Active Problems:   Headache   1. Left-sided weakness - I have discussed with on-call urologist Dr. Thad Ranger. Patient's previous images are very inconsistent at this time. Since patient has braces at this time CT angiogram of the head and neck has been ordered along with EEG. Physical therapy consult. 2. Headache - Dr. Thad Ranger has suggested tramadol when necessary. 3. Mild renal failure - gently hydrate and recheck metabolic panel.   DVT Prophylaxis Lovenox.  Code Status: Full code.  Family Communication: Patient's husband at the bedside.  Disposition Plan: Admit to  inpatient. Likely stay 2 days.    Brittish Bolinger N. [facility] Pager [phone].  If 7PM-7AM, please contact night-coverage [email or web address] Password [email or web address] 05/28/2014, 4:53 AM

## 2014-05-28 NOTE — Progress Notes (Signed)
[id number] had [facility] at home and new onset of left side weakness, dizziness, patients initial NIH 12, on tele  NSR patient is complaining of head ache and she passed her swallow screen and has CM/HH diet now. Patient in room resting with husband vital signs are in normal range and she has q 2 nero and vital sign checks started at around 0150. Still awaiting orders hospital ist has seen her waiting on neuro, will continue to monitor.

## 2014-05-28 NOTE — Progress Notes (Signed)
EEG completed; results pending.    

## 2014-05-28 NOTE — Progress Notes (Signed)
PT Cancellation Note  Patient Details Name: Kayla HaggisLisa [name] MRN: [id number] DOB: 06-18-1960   Cancelled Treatment:    Reason Eval/Treat Not Completed: Fatigue/lethargy limiting ability to participate (Pt just had EEG and is sleeping soundly. Will attempt later today. )   Tamala SerUhlenberg, Royalti Schauf Kistler 05/28/2014, 10:03 AM [phone]

## 2014-05-28 NOTE — Progress Notes (Signed)
I have seen and assessed patient and agree with Dr.Kakrakandy's assessment and plan. EEG negative for seizures. Neurology following.

## 2014-05-28 NOTE — Consult Note (Signed)
Reason for Consult:Left sided weakness  Referring Physician: Thompson  CC: Left sided weakneJanee MornHPI: Kayla [name] is an 54 y.o. female who has a history of weakness.  In the past the patient reports that she was diagnosed with a form of ALS.  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  She reports that she was told that she would be getting better but instead had gotten worse.  She reports being more fatigued.  Has felt weaker.  Last evening while preparing to eat dinner felt acute onset dizziness.  Patient then drooped utensils out of her left hand and fell.  She wa carried to her bed.  When she attempted to get out of bed about 30 minutes later she fell again.  The patient was brought on for evaluation at that time.   The patient reports that her weakness began multiple years ago with left lower extremity weakness that progressed to all extremities being weak.    Past Medical History  Diagnosis Date  . [name]   . Primary lateral sclerosis   . Hyperlipidemia   . G tube feedings     Past Surgical History  Procedure Laterality Date  . Abdominal hysterectomy    . Cholecystectomy    . Appendectomy    . Peg placement    . Neck surgery    . Esophagogastroduodenoscopy N/A 05/19/2012    Procedure: ESOPHAGOGASTRODUODENOSCOPY (EGD);  Surgeon: Theda Belfast, MD;  Location: [facility] ENDOSCOPY;  Service: Endoscopy;  Laterality: N/A;    Family history: Mother, father and brother died of complications of alcoholism.  Paternal uncles died of CAD.    Social History:  reports that she has been smoking Cigarettes.  She has been smoking about 0.50 packs per day. She does not have any smokeless tobacco history on file. She reports that she does not drink alcohol or use illicit drugs.  Allergies  Allergen Reactions  . Dilaudid [Hydromorphone Hcl] Itching  . Codeine Rash    Medications:  I have reviewed the patient's current medications. Prior to  Admission:  Prescriptions prior to admission  Medication Sig Dispense Refill Last Dose  . buPROPion (WELLBUTRIN XL) 300 MG 24 hr tablet Take 300 mg by mouth daily.   05/27/2014 at Unknown time  . clonazePAM (KLONOPIN) 1 MG tablet Take 1 mg by mouth 2 (two) times daily.    05/27/2014 at Unknown time  . estradiol (ESTRACE) 1 MG tablet Take 1 mg by mouth daily.   05/27/2014 at Unknown time  . ezetimibe (ZETIA) 10 MG tablet Take 10 mg by mouth daily.   05/27/2014 at Unknown time  . oxyCODONE-acetaminophen (PERCOCET/ROXICET) 5-325 MG per tablet Take 1 tablet by mouth 2 (two) times daily as needed for severe pain.   Past Week at Unknown time  . topiramate (TOPAMAX) 50 MG tablet Take 25 mg by mouth 2 (two) times daily.   05/27/2014 at Unknown time  . traZODone (DESYREL) 100 MG tablet Take 100-200 mg by mouth at bedtime.    05/26/2014 at Unknown time  . amoxicillin-clavulanate (AUGMENTIN) 875-125 MG per tablet Take 1 tablet by mouth every 12 (twelve) hours. (Patient not taking: Reported on 05/28/2014) 14 tablet 0 Completed Course at Unknown time  . clindamycin (CLEOCIN) 150 MG capsule Take 2 capsules (300 mg total) by mouth 4 (four) times daily. (Patient not taking: Reported on 05/28/2014) 56 capsule 0 Completed Course at Unknown time  . cyclobenzaprine (FLEXERIL) 10 MG tablet Take  1 tablet (10 mg total) by mouth 2 (two) times daily as needed for muscle spasms. (Patient not taking: Reported on 05/28/2014) 10 tablet 0 Not Taking at Unknown time  . fluconazole (DIFLUCAN) 150 MG tablet Take 1 tablet (150 mg total) by mouth once. If signs or symptoms of yeast infection (Patient not taking: Reported on 05/28/2014) 1 tablet 0 Completed Course at Unknown time  . fluconazole (DIFLUCAN) 200 MG tablet Take 1 tablet (200 mg total) by mouth daily. (Patient not taking: Reported on 05/28/2014) 3 tablet 0 Completed Course at Unknown time  . HYDROcodone-acetaminophen (NORCO) 5-325 MG per tablet Take 1 tablet by mouth every 6 (six)  hours as needed. (Patient not taking: Reported on 05/28/2014) 10 tablet 0 Not Taking at Unknown time  . HYDROcodone-acetaminophen (NORCO/VICODIN) 5-325 MG per tablet Take 1 tablet by mouth every 6 (six) hours as needed for pain. (Patient not taking: Reported on 05/28/2014) 15 tablet 0 Not Taking at Unknown time  . ondansetron (ZOFRAN) 4 MG tablet Take 1 tablet (4 mg total) by mouth every 6 (six) hours. (Patient not taking: Reported on 05/28/2014) 12 tablet 0 Not Taking at Unknown time  . oxyCODONE-acetaminophen (PERCOCET) 5-325 MG per tablet Take 1 tablet by mouth every 6 (six) hours as needed for moderate pain. (Patient not taking: Reported on 05/28/2014) 20 tablet 0 Not Taking at Unknown time  . oxyCODONE-acetaminophen (PERCOCET/ROXICET) 5-325 MG per tablet Take 1-2 tablets by mouth every 6 (six) hours as needed for severe pain. (Patient not taking: Reported on 05/28/2014) 20 tablet 0 Not Taking at Unknown time   Scheduled:  ROS: History obtained from the patient  General ROS: as noted in HPI Psychological ROS: depression Ophthalmic ROS: photophobia ENT ROS: negative for - epistaxis, nasal discharge, oral lesions, sore throat, tinnitus or vertigo Allergy and Immunology ROS: negative for - hives or itchy/watery eyes Hematological and Lymphatic ROS: negative for - bleeding problems, bruising or swollen lymph nodes Endocrine ROS: negative for - galactorrhea, hair pattern changes, polydipsia/polyuria or temperature intolerance Respiratory ROS: negative for - cough, hemoptysis, shortness of breath or wheezing Cardiovascular ROS: negative for - chest pain, dyspnea on exertion, edema or irregular heartbeat Gastrointestinal ROS: negative for - abdominal pain, diarrhea, hematemesis, nausea/vomiting or stool incontinence Genito-Urinary ROS: negative for - dysuria, hematuria, incontinence or urinary frequency/urgency Musculoskeletal ROS: negative for - joint swelling or muscular weakness Neurological ROS:  as noted in HPI Dermatological ROS: negative for rash and skin lesion changes  Physical Examination: Blood pressure 118/49, pulse 70, temperature 97.9 F (36.6 C), temperature source Oral, resp. rate 16, height 5\' 3"  (1.6 m), weight 68.04 kg (150 lb), SpO2 99 %.  HEENT-  Normocephalic, no lesions, without obvious abnormality.  Normal external eye and conjunctiva.  Normal TM's bilaterally.  Normal auditory canals and external ears. Normal external nose, mucus membranes and septum.  Normal pharynx. Cardiovascular- S1, S2 normal, pulses palpable throughout   Lungs- chest clear, no wheezing, rales, normal symmetric air entry Abdomen- soft, non-tender; bowel sounds normal; no masses,  no organomegaly Extremities- no edema Lymph-no adenopathy palpable Musculoskeletal-no joint tenderness, deformity or swelling Skin-warm and dry, no hyperpigmentation, vitiligo, or suspicious lesions  Neurological Examination Mental Status: Alert, oriented, thought content appropriate.  Speech fluent without evidence of aphasia.  Able to follow 3 step commands without difficulty. Cranial Nerves: II: Discs unable to be visualized secondary to patient cooperation; Visual fields grossly normal, pupils equal, round, reactive to light and accommodation III,IV, VI: ptosis not present, extra-ocular motions intact  bilaterally V,VII: smile symmetric, facial light touch sensation normal bilaterally VIII: hearing normal bilaterally IX,X: gag reflex present XI: bilateral shoulder shrug XII: midline tongue extension Motor: Patient gives 5/5 strength on the right.  Unable to lift LUE to command.  When arm supported at elbow patient can move arm back and forth.  When arm lifted above head it falls back to the bed and does not hit the face.  Unable to lift left leg off the bed but can maintain on right leg for hell to shin testing.  When lifted at the knee able to maintain distal leg above gravity.  When asked to lift left leg  while lifting the right, shifts to lifting the right.  When both legs lifted together does not maintain either leg against gravity.  No reciprocal downward movement of the right leg when left leg lifting is attempted.   Sensory: Pinprick and light touch intact throughout, bilaterally Deep Tendon Reflexes: 2+ and symmetric throughout Plantars: Right: downgoing   Left: downgoing Cerebellar: Normal finger-to-nose testing on the right.  Able to perform on the left when arm supported at the elbow with tremor.  Normal heel-to-shin testing bilaterally Gait: not tested due to safety concerns   Laboratory Studies:   Basic Metabolic Panel:  Recent Labs Lab 05/27/14 2226  NA 138  K 3.4*  CL 104  CO2 24  GLUCOSE 120*  BUN 7  CREATININE 1.08*  CALCIUM 9.6    Liver Function Tests:  Recent Labs Lab 05/27/14 2226  AST 21  ALT 21  ALKPHOS 67  BILITOT 0.4  PROT 8.1  ALBUMIN 4.4   No results for input(s): LIPASE, AMYLASE in the last 168 hours. No results for input(s): AMMONIA in the last 168 hours.  CBC:  Recent Labs Lab 05/27/14 2226  WBC 9.7  NEUTROABS 5.5  HGB 13.9  HCT 41.4  MCV 87.5  PLT 257    Cardiac Enzymes: No results for input(s): CKTOTAL, CKMB, CKMBINDEX, TROPONINI in the last 168 hours.  BNP: Invalid input(s): POCBNP  CBG: No results for input(s): GLUCAP in the last 168 hours.  Microbiology: Results for orders placed or performed during the hospital encounter of 01/22/13  Urine culture     Status: None   Collection Time: 01/22/13  3:23 PM  Result Value Ref Range Status   Specimen Description URINE, CLEAN CATCH  Final   Special Requests NONE  Final   Culture  Setup Time   Final    01/22/2013 22:56 Performed at [facility]   Colony Count NO GROWTH Performed at [facility]  Final   Culture NO GROWTH Performed at [facility]  Final   Report Status 01/24/2013 FINAL  Final    Coagulation Studies: No results for input(s):  LABPROT, INR in the last 72 hours.  Urinalysis:  Recent Labs Lab 05/27/14 2325  COLORURINE YELLOW  LABSPEC 1.003*  PHURINE 7.0  GLUCOSEU NEGATIVE  HGBUR NEGATIVE  BILIRUBINUR NEGATIVE  KETONESUR NEGATIVE  PROTEINUR NEGATIVE  UROBILINOGEN 0.2  NITRITE NEGATIVE  LEUKOCYTESUR NEGATIVE    Lipid Panel:  No results found for: CHOL, TRIG, HDL, CHOLHDL, VLDL, LDLCALC  HgbA1C: No results found for: HGBA1C  Urine Drug Screen:  No results found for: LABOPIA, COCAINSCRNUR, LABBENZ, AMPHETMU, THCU, LABBARB  Alcohol Level:  Recent Labs Lab 05/27/14 2226  ETH <5    Other results: EKG:  normal sinus rhythm at 80 bpm.  Imaging: Ct Head Wo Contrast  05/27/2014   CLINICAL DATA:  Code stroke for left-sided numbness and 7 p.m. Dizziness and headache.  EXAM: CT HEAD WITHOUT CONTRAST  TECHNIQUE: Contiguous axial images were obtained from the base of the skull through the vertex without intravenous contrast.  COMPARISON:  No comparison head CT available.  Brain MRI 12/20/2012  FINDINGS: Skull and Sinuses:Negative for fracture or destructive process. The mastoids, middle ears, and imaged paranasal sinuses are clear.  Orbits: No acute abnormality.  Brain: No evidence of acute infarction, hemorrhage, hydrocephalus, or mass lesion/mass effect.  These results were called by telephone at the time of interpretation on 05/27/2014 at 10:32 pm to Dr. Tilden Fossa , who verbally acknowledged these results.  IMPRESSION: Negative head CT.   Electronically Signed   By: Marnee Spring M.D.   On: 05/27/2014 22:33   Ct Cervical Spine Wo Contrast  05/27/2014   CLINICAL DATA:  Left-sided numbness and 7 p.m. Headache, dizziness, stable episode.  EXAM: CT CERVICAL SPINE WITHOUT CONTRAST  TECHNIQUE: Multidetector CT imaging of the cervical spine was performed without intravenous contrast. Multiplanar CT image reconstructions were also generated.  COMPARISON:  Head CT- earlier same day  FINDINGS: C1 to the superior  endplate of T2 is imaged.  There is straightening of the expected cervical lordosis. No anterolisthesis or retrolisthesis. The dens is normally positioned and a lateral masses of C1. Normal atlantodental and atlantoaxial articulations. The bilateral facets are normally aligned.  Post C5-C6 ACDF and interbody fusion without evidence of hardware failure or loosening.  Cervical vertebral body heights are preserved. Prevertebral soft tissues are normal.  There is mild multilevel cervical spine DDD, worse at C4-C5 and C6-C7 with disc space height loss, endplate irregularity and sclerosis.  Scattered shotty bilateral cervical lymph nodes are individually not enlarged by size criteria. Normal noncontrast appearance of the thyroid gland. Limited visualization of lung apices is normal.  IMPRESSION: 1. No fracture or static subluxation of the cervical spine. 2. Post C5-C6 ACDF and interbody fusion without evidence of hardware failure or loosening. 3. 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   Electronically Signed   By: Simonne Come M.D.   On: 05/27/2014 23:40     Assessment/Plan: 54 year old female presenting with left sided weakness.  Neurological examination has multiple functional features.  Head CT reviewed and shows no acute changes.  Patient likely unable to have an MRI of the brain due to her braces.  Agree with recent diagnosis of "functional neurological disorder".  Recommendations: 1.  CTA of head and neck 2.  Ultram prn for HA 3.  PT/OT consults 4.  EEG  Thana Farr, MD [facility] [phone] 05/28/2014, 4:33 AM

## 2014-05-28 NOTE — Procedures (Signed)
History: 54 yo F with left sided weakness  Sedation: None  Technique: This is a 19 channel routine scalp EEG performed at the bedside with bipolar and monopolar montages arranged in accordance to the international 10/20 system of electrode placement. One channel was dedicated to EKG recording.    Background: The background consists of intermixed alpha and beta activities. There is a well defined posterior dominant rhythm of 10 Hz that attenuates with eye opening. Sleep is recorded with normal appearing structures.   Photic stimulation: Physiologic driving is present  EEG Abnormalities: none  Clinical Interpretation: This normal EEG is recorded in the waking and sleep state. There was no seizure or seizure predisposition recorded on this study.   Kayla SlotMcNeill Raylei Losurdo, MD [facility] [phone]  If 7pm- 7am, please page neurology on call as listed in AMION.

## 2014-05-28 NOTE — Progress Notes (Addendum)
Pt. Off unit to CT at 2100 returned 2145.

## 2014-05-28 NOTE — Plan of Care (Signed)
Problem: Progression Outcomes Goal: Initial discharge plan initiated Outcome: Completed/Met Date Met:  05/28/14 To return home with husband

## 2014-05-29 DIAGNOSIS — R51 Headache: Secondary | ICD-10-CM | POA: Diagnosis not present

## 2014-05-29 DIAGNOSIS — R531 Weakness: Secondary | ICD-10-CM

## 2014-05-29 DIAGNOSIS — M6289 Other specified disorders of muscle: Secondary | ICD-10-CM | POA: Diagnosis not present

## 2014-05-29 LAB — HEMOGLOBIN A1C
HEMOGLOBIN A1C: 5.8 % — AB (ref 4.8–5.6)
MEAN PLASMA GLUCOSE: 120 mg/dL

## 2014-05-29 LAB — CBC
HEMATOCRIT: 39.3 % (ref 36.0–46.0)
Hemoglobin: 12.8 g/dL (ref 12.0–15.0)
MCH: 28.7 pg (ref 26.0–34.0)
MCHC: 32.6 g/dL (ref 30.0–36.0)
MCV: 88.1 fL (ref 78.0–100.0)
PLATELETS: 236 10*3/uL (ref 150–400)
RBC: 4.46 MIL/uL (ref 3.87–5.11)
RDW: 13 % (ref 11.5–15.5)
WBC: 7 10*3/uL (ref 4.0–10.5)

## 2014-05-29 LAB — BASIC METABOLIC PANEL
Anion gap: 10 (ref 5–15)
BUN: 5 mg/dL — ABNORMAL LOW (ref 6–20)
CALCIUM: 9.1 mg/dL (ref 8.9–10.3)
CHLORIDE: 109 mmol/L (ref 101–111)
CO2: 19 mmol/L — AB (ref 22–32)
Creatinine, Ser: 1.07 mg/dL — ABNORMAL HIGH (ref 0.44–1.00)
GFR calc Af Amer: >60 mL/min (ref >60)
GFR calc non Af Amer: 58 mL/min — ABNORMAL LOW (ref >60)
Glucose, Bld: 137 mg/dL — ABNORMAL HIGH (ref 65–99)
Potassium: 3.3 mmol/L — ABNORMAL LOW (ref 3.5–5.1)
Sodium: 138 mmol/L (ref 135–145)

## 2014-05-29 MED ORDER — POTASSIUM CHLORIDE CRYS ER 20 MEQ PO TBCR
40.0000 meq | EXTENDED_RELEASE_TABLET | Freq: Once | ORAL | Status: AC
  Administered 2014-05-29: 40 meq via ORAL
  Filled 2014-05-29: qty 2

## 2014-05-29 NOTE — Progress Notes (Signed)
While performing NIHSS patient was unable to hold up left arm. Just prior to doing NIHSS patient was given potassium orally. Patient used left arm to pick up 32 oz [employer] cup filled with drink with no problems. Madelin RearLonnie Jaqua Ching, MSN, RN, [employer] EnergyCMSRN

## 2014-05-29 NOTE — Progress Notes (Signed)
Chaplain responded to page for pt needing chaplain. Chaplain and pt explored pt feeling "down." Chaplain and pt explored coping strategies and short term goals that may lift pt spirits. Chaplain offered to keep family in her prayers. Chaplain recommends follow up by unit chaplain.    05/29/14 1345  Clinical Encounter Type  Visited With Patient and family together  Visit Type Initial;Spiritual support  Referral From Chaplain  Spiritual Encounters  Spiritual Needs Emotional;Prayer  Stress Factors  Patient Stress Factors Major life changes  Kayla [name], Mayer MaskerCourtney F, Chaplain 05/29/2014 4:21 PM

## 2014-05-29 NOTE — Evaluation (Addendum)
Physical Therapy Evaluation Patient Details Name: Kayla [name] MRN: [id number] DOB: [birth year] Today's Date: 05/29/2014   History of Present Illness  HPI: Kayla [name] is an 54 y.o. female who has a history of weakness. In the past the patient reports that she was diagnosed with a form of ALS. 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 She reports that she was told that she would be getting better but instead had gotten worse. She reports being more fatigued. Has felt weaker. Last evening while preparing to eat dinner felt acute onset dizziness. Patient then drooped utensils out of her left hand and fell. She wa carried to her bed. When she attempted to get out of bed about 30 minutes later she fell again. The patient was brought on for evaluation at that time.   Clinical Impression   Pt admitted with above diagnosis. Pt currently with functional limitations due to the deficits listed below (see PT Problem List). Given features of her exam and recent diagnosis of functional neurologic disorder, highlighting progress will be hopefully quite therapeutic for [name];  Pt will benefit from skilled PT to increase their independence and safety with mobility to allow discharge to the venue listed below.       Follow Up Recommendations Home health PT;Supervision/Assistance - 24 hour ([facility]    Equipment Recommendations  None recommended by PT (Pretty well equipped)    Recommendations for Other Services Other (comment) (Is Neuropsychology available inpatient?)  Psych consult per Neuro recommendations;  Chaplain    Precautions / Restrictions Precautions Precautions: Fall Restrictions Weight Bearing Restrictions: No      Mobility  Bed Mobility Overal bed mobility: Needs Assistance Bed Mobility: Sit to Supine       Sit to supine: Min assist   General bed mobility comments: Min assist to  help LLE into bed; noted pt using bil UEs relatively well to help her LLE into bed  Transfers Overall transfer level: Needs assistance Equipment used: Rolling walker (2 wheeled) Transfers: Sit to/from Stand Sit to Stand: Min assist         General transfer comment: Min steady assist; min assist to place L hand onto RW  Ambulation/Gait Ambulation/Gait assistance: Min guard Ambulation Distance (Feet):  (pivot steps bed to chair) Assistive device: Rolling walker (2 wheeled) Gait Pattern/deviations: Decreased stance time - left     General Gait Details: tends to slide LLE for advancement; reports feeling unsteady  Stairs            Wheelchair Mobility    Modified Rankin (Stroke Patients Only)       Balance Overall balance assessment: Needs assistance Sitting-balance support: Feet supported Sitting balance-Leahy Scale: Good       Standing balance-Leahy Scale: Poor                               Pertinent Vitals/Pain Pain Assessment: No/denies pain    Home Living Family/patient expects to be discharged to:: Private residence Living Arrangements: Spouse/significant other Available Help at Discharge: Family;Available PRN/intermittently (But pt reports she has near 24 hour support) Type of Home: House Home Access: Level entry     Home Layout: One level Home Equipment: Walker - 2 wheels;Walker - 4 wheels;Cane - single point;Wheelchair - manual      Prior Function Level of Independence: Independent with assistive device(s)  Comments: Reports she had worked very hard to recover from her initial bout with weakness, and rehabbed through the typical path of needing walker, then cane, then no AD     Hand Dominance        Extremity/Trunk Assessment   Upper Extremity Assessment: Defer to OT evaluation;Generalized weakness           Lower Extremity Assessment: Generalized weakness (her MMT exam was inconclusive)          Communication   Communication: No difficulties;Other (comment) (Emotionally labile)  Cognition Arousal/Alertness: Awake/alert Behavior During Therapy: WFL for tasks assessed/performed (though tearful at times) Overall Cognitive Status: Within Functional Limits for tasks assessed                      General Comments      Exercises        Assessment/Plan    PT Assessment Patient needs continued PT services  PT Diagnosis Difficulty walking;Generalized weakness   PT Problem List Decreased strength;Decreased activity tolerance;Decreased balance;Decreased mobility;Decreased coordination;Decreased knowledge of use of DME;Decreased safety awareness  PT Treatment Interventions DME instruction;Gait training;Functional mobility training;Therapeutic activities;Therapeutic exercise;Balance training;Neuromuscular re-education;Patient/family education   PT Goals (Current goals can be found in the Care Plan section) Acute Rehab PT Goals Patient Stated Goal: Does not want to go SNF PT Goal Formulation: With patient Time For Goal Achievement: 06/12/14 Potential to Achieve Goals: Good    Frequency Min 3X/week   Barriers to discharge        Co-evaluation               End of Session Equipment Utilized During Treatment: Gait belt Activity Tolerance: Patient tolerated treatment well;Patient limited by fatigue Patient left: in bed;with call bell/phone within reach;with family/visitor present Nurse Communication: Mobility status    Functional Assessment Tool Used: Clinical Judgement Functional Limitation: Mobility: Walking and moving around Mobility: Walking and Moving Around Current Status [phone]): At least 20 percent but less than 40 percent impaired, limited or restricted Mobility: Walking and Moving Around Goal Status [phone]): 0 percent impaired, limited or restricted    Time: 1478-2956 PT Time Calculation (min) (ACUTE ONLY): 29 min   Charges:   PT  Evaluation $Initial PT Evaluation Tier I: 1 Procedure PT Treatments $Therapeutic Activity: 8-22 mins   PT G Codes:   PT G-Codes **NOT FOR INPATIENT CLASS** Functional Assessment Tool Used: Clinical Judgement Functional Limitation: Mobility: Walking and moving around Mobility: Walking and Moving Around Current Status (O1308): At least 20 percent but less than 40 percent impaired, limited or restricted Mobility: Walking and Moving Around Goal Status [phone]): 0 percent impaired, limited or restricted    Van Clines [facility] 05/29/2014, 12:43 PM  Van Clines, PT  Acute Rehabilitation Services Pager [phone] Office [phone]

## 2014-05-29 NOTE — Progress Notes (Signed)
[facility]                                                                              Patient Demographics  Cicley Ganesh, is a 54 y.o. female, DOB - 12-14-1960, WJX:[id number]  Admit date - 05/27/2014   Admitting Physician Eduard Clos, MD  Outpatient Primary MD for the patient is Caffie Damme, MD  LOS - 2   Chief Complaint  Patient presents with  . Numbness       Brief HPI   Patient is a 54 year old female with migraine and previous diagnosis of ALS which was presently rediagnosed as possible functional neurological disorder was brought to the ER after patient was found to have left-sided weakness. Patient reported that evening of admission around 7 PM while was preparing dinner she suddenly started developing left-sided weakness and headache and lost control of full left side. In the ER CT head and C-spine were unremarkable and patient was brought to [facility] for further workup. Patient also reported having some frontal headaches since last evening. Denied any blurred vision difficulty speaking swallowing. Neurologist on call Dr. Thad Ranger was consulted. Patient denied any chest pain or shortness of breath.     Assessment & Plan    Principal Problem:   Left-sided weakness; unclear etiology - Patient was seen by neurology, felt to have multiple functional features - CT angiogram of the head and neck negative - EEG normal with no seizure or seizure predisposition - PT evaluation recommended home health physical therapy - Neurology recommending psych evaluation - Psychiatry consulted  Active Problems:   Headache - Tramadol as needed for pain  Diarrhea - Obtain stool studies, if C. difficile negative, will place on Imodium  - Patient reports chronic, has seen Dr. Elnoria Howard in the past, recommended to follow up with GI  Hypokalemia - Likely due to diarrhea, replaced  Anxiety Continue Wellbutrin, Klonopin Psychiatry  consulted  Hyperlipidemia Continue Zetia   Code Status: Full code  Family Communication: Discussed in detail with the patient, all imaging results, lab results explained to the patient    Disposition Plan: Hopefully home tomorrow, await psych evaluation  Time Spent in minutes  25 minutes  Procedures  CTA head and neck EEG  Consults   Neurology Psychiatry  DVT Prophylaxis  Lovenox  Medications  Scheduled Meds: . buPROPion  300 mg Oral Daily  . clonazePAM  1 mg Oral BID  . enoxaparin (LOVENOX) injection  40 mg Subcutaneous Q24H  . ezetimibe  10 mg Oral Daily  . topiramate  25 mg Oral BID  . traZODone  100-200 mg Oral QHS   Continuous Infusions:  PRN Meds:.SUMAtriptan, traMADol   Antibiotics   Anti-infectives    None        Subjective:   Exilda Wilhite was seen and examined today. Continue to complain of left-sided weakness although starting to feel better today. Diarrhea, poor states for several months.  Patient denies dizziness, chest pain, shortness of breath, abdominal pain, No acute events overnight.    Objective:   Blood pressure 117/64, pulse 72, temperature 97.7 F (36.5 C), temperature source Oral, resp. rate 18, height   (1.6 m), weight 68.04 kg (150 lb), SpO2 99 %.  Wt Readings from Last 3 Encounters:  05/27/14 68.04 kg (150 lb)  03/21/13 58.968 kg (130 lb)  02/24/13 56.246 kg (124 lb)     Intake/Output Summary (Last 24 hours) at 05/29/14 1431 Last data filed at 05/29/14 0924  Gross per 24 hour  Intake 1448.33 ml  Output   2000 ml  Net -551.67 ml    Exam  General: Alert and oriented x 3, NAD  HEENT:  PERRLA, EOMI, Anicteic Sclera, mucous membranes moist.   Neck: Supple, no JVD, no masses  CVS: S1 S2 auscultated, no rubs, murmurs or gallops. Regular rate and rhythm.  Respiratory: Clear to auscultation bilaterally, no wheezing, rales or rhonchi  Abdomen: Soft, nontender, nondistended, + bowel sounds  Ext: no cyanosis clubbing  or edema  Neuro: AAOx3, Cr N's II- XII. Strength 5/5 upper and lower extremities on the right however on the left 3/5   Skin: No rashes  Psych: Normal affect and demeanor, alert and oriented x3    Data Review   Micro Results No results found for this or any previous visit (from the past 240 hour(s)).  Radiology Reports Ct Angio Head W/cm &/or Wo Cm  05/28/2014   CLINICAL DATA:  LEFT hemi paresis 3 days ago, subsequent headache and dizziness. History of Amyotrophic lateral sclerosis.  EXAM: CT ANGIOGRAPHY HEAD AND NECK  TECHNIQUE: Multidetector CT imaging of the head and neck was performed using the standard protocol during bolus administration of intravenous contrast. Multiplanar CT image reconstructions and MIPs were obtained to evaluate the vascular anatomy. Carotid stenosis measurements (when applicable) are obtained utilizing NASCET criteria, using the distal internal carotid diameter as the denominator.  CONTRAST:  50mL OMNIPAQUE IOHEXOL 350 MG/ML SOLN  COMPARISON:  CT head and cervical spine May 27, 2014 and MRI brain December 20, 2012  FINDINGS: CT HEAD  The ventricles and sulci are normal for age. No intraparenchymal hemorrhage, mass effect nor midline shift. No acute large vascular territory infarcts. No abnormal intracranial enhancement.  No abnormal extra-axial fluid collections. Basal cisterns are patent.  No skull fracture. The included ocular globes and orbital contents are non-suspicious. LEFT maxillary mucosal thickening. The mastoid air cells are well-aerated. Pneumatized petrous apices.  CTA NECK  Normal appearance of the thoracic arch, normal branch pattern. The origins of the innominate, left Common carotid artery and subclavian artery are widely patent.  Bilateral Common carotid arteries are widely patent, coursing in a straight line fashion. Normal appearance of the carotid bifurcations without hemodynamically significant stenosis by NASCET criteria. Normal appearance of the  included internal carotid arteries. Streak artifact from dental amalgam limits assessment of vessels at the skull base.  Codominant vertebral artery's. Normal appearance of the vertebral arteries, which appear widely patent.  No hemodynamically significant stenosis by NASCET criteria. No dissection, no pseudoaneurysm. No abnormal luminal irregularity. No contrast extravasation.  Soft tissues are non acute; RIGHT parotid sialolith. No acute osseous process though bone windows have not been submitted. C5-6 ACDF with solid interbody fusion. Trace bilateral pleural effusions partially imaged.  CTA HEAD  Anterior circulation: Normal appearance of the cervical internal carotid arteries, petrous, cavernous and supra clinoid internal carotid arteries. Widely patent anterior communicating artery. Normal appearance of the anterior and middle cerebral arteries.  Posterior circulation: Normal appearance of the vertebral arteries, vertebrobasilar junction and basilar artery, as well as main branch vessels. Fetal origin RIGHT posterior cerebral artery. Normal appearance of the posterior cerebral  arteries.  No large vessel occlusion, hemodynamically significant stenosis, dissection, luminal irregularity, contrast extravasation or aneurysm within the anterior nor posterior circulation.  IMPRESSION: Negative noncontrast CT head for age.  Negative CTA head and neck.   Electronically Signed   By: Awilda Metro M.D.   On: 05/28/2014 22:46   Ct Head Wo Contrast  05/27/2014   CLINICAL DATA:  Code stroke for left-sided numbness and 7 p.m. Dizziness and headache.  EXAM: CT HEAD WITHOUT CONTRAST  TECHNIQUE: Contiguous axial images were obtained from the base of the skull through the vertex without intravenous contrast.  COMPARISON:  No comparison head CT available.  Brain MRI 12/20/2012  FINDINGS: Skull and Sinuses:Negative for fracture or destructive process. The mastoids, middle ears, and imaged paranasal sinuses are clear.   Orbits: No acute abnormality.  Brain: No evidence of acute infarction, hemorrhage, hydrocephalus, or mass lesion/mass effect.  These results were called by telephone at the time of interpretation on 05/27/2014 at 10:32 pm to Dr. Tilden Fossa , who verbally acknowledged these results.  IMPRESSION: Negative head CT.   Electronically Signed   By: Marnee Spring M.D.   On: 05/27/2014 22:33   Ct Angio Neck W/cm &/or Wo/cm  05/28/2014   CLINICAL DATA:  LEFT hemi paresis 3 days ago, subsequent headache and dizziness. History of Amyotrophic lateral sclerosis.  EXAM: CT ANGIOGRAPHY HEAD AND NECK  TECHNIQUE: Multidetector CT imaging of the head and neck was performed using the standard protocol during bolus administration of intravenous contrast. Multiplanar CT image reconstructions and MIPs were obtained to evaluate the vascular anatomy. Carotid stenosis measurements (when applicable) are obtained utilizing NASCET criteria, using the distal internal carotid diameter as the denominator.  CONTRAST:  50mL OMNIPAQUE IOHEXOL 350 MG/ML SOLN  COMPARISON:  CT head and cervical spine May 27, 2014 and MRI brain December 20, 2012  FINDINGS: CT HEAD  The ventricles and sulci are normal for age. No intraparenchymal hemorrhage, mass effect nor midline shift. No acute large vascular territory infarcts. No abnormal intracranial enhancement.  No abnormal extra-axial fluid collections. Basal cisterns are patent.  No skull fracture. The included ocular globes and orbital contents are non-suspicious. LEFT maxillary mucosal thickening. The mastoid air cells are well-aerated. Pneumatized petrous apices.  CTA NECK  Normal appearance of the thoracic arch, normal branch pattern. The origins of the innominate, left Common carotid artery and subclavian artery are widely patent.  Bilateral Common carotid arteries are widely patent, coursing in a straight line fashion. Normal appearance of the carotid bifurcations without hemodynamically  significant stenosis by NASCET criteria. Normal appearance of the included internal carotid arteries. Streak artifact from dental amalgam limits assessment of vessels at the skull base.  Codominant vertebral artery's. Normal appearance of the vertebral arteries, which appear widely patent.  No hemodynamically significant stenosis by NASCET criteria. No dissection, no pseudoaneurysm. No abnormal luminal irregularity. No contrast extravasation.  Soft tissues are non acute; RIGHT parotid sialolith. No acute osseous process though bone windows have not been submitted. C5-6 ACDF with solid interbody fusion. Trace bilateral pleural effusions partially imaged.  CTA HEAD  Anterior circulation: Normal appearance of the cervical internal carotid arteries, petrous, cavernous and supra clinoid internal carotid arteries. Widely patent anterior communicating artery. Normal appearance of the anterior and middle cerebral arteries.  Posterior circulation: Normal appearance of the vertebral arteries, vertebrobasilar junction and basilar artery, as well as main branch vessels. Fetal origin RIGHT posterior cerebral artery. Normal appearance of the posterior cerebral arteries.  No large vessel occlusion,  hemodynamically significant stenosis, dissection, luminal irregularity, contrast extravasation or aneurysm within the anterior nor posterior circulation.  IMPRESSION: Negative noncontrast CT head for age.  Negative CTA head and neck.   Electronically Signed   By: Courtnay  Bloomer M.D.  Awilda Metro On: 05/28/2014 22:46   Ct Cervical Spine Wo Contrast  05/27/2014   CLINICAL DATA:  Left-sided numbness and 7 p.m. Headache, dizziness, stable episode.  EXAM: CT CERVICAL SPINE WITHOUT CONTRAST  TECHNIQUE: Multidetector CT imaging of the cervical spine was performed without intravenous contrast. Multiplanar CT image reconstructions were also generated.  COMPARISON:  Head CT- earlier same day  FINDINGS: C1 to the superior endplate of T2 is imaged.   There is straightening of the expected cervical lordosis. No anterolisthesis or retrolisthesis. The dens is normally positioned and a lateral masses of C1. Normal atlantodental and atlantoaxial articulations. The bilateral facets are normally aligned.  Post C5-C6 ACDF and interbody fusion without evidence of hardware failure or loosening.  Cervical vertebral body heights are preserved. Prevertebral soft tissues are normal.  There is mild multilevel cervical spine DDD, worse at C4-C5 and C6-C7 with disc space height loss, endplate irregularity and sclerosis.  Scattered shotty bilateral cervical lymph nodes are individually not enlarged by size criteria. Normal noncontrast appearance of the thyroid gland. Limited visualization of lung apices is normal.  IMPRESSION: 1. No fracture or static subluxation of the cervical spine. 2. Post C5-C6 ACDF and interbody fusion without evidence of hardware failure or loosening. 3. 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   Electronically Signed   By: Simonne ComeJohn  Watts M.D.   On: 05/27/2014 23:40    CBC  Recent Labs Lab 05/27/14 2226 05/28/14 0758 05/29/14 0723  WBC 9.7 8.8 7.0  HGB 13.9 12.7 12.8  HCT 41.4 36.9 39.3  PLT 257 241 236  MCV 87.5 86.6 88.1  MCH 29.4 29.8 28.7  MCHC 33.6 34.4 32.6  RDW 12.7 13.0 13.0  LYMPHSABS 3.1 2.6  --   MONOABS 0.8 0.6  --   EOSABS 0.2 0.2  --   BASOSABS 0.0 0.0  --     Chemistries   Recent Labs Lab 05/27/14 2226 05/28/14 0758 05/29/14 0723  NA 138 138 138  K 3.4* 3.5 3.3*  CL 104 105 109  CO2 24 23 19*  GLUCOSE 120* 86 137*  BUN 7 <5* 5*  CREATININE 1.08* 1.02* 1.07*  CALCIUM 9.6 9.0 9.1  AST 21 19  --   ALT 21 17  --   ALKPHOS 67 55  --   BILITOT 0.4 0.6  --    ------------------------------------------------------------------------------------------------------------------ estimated creatinine clearance is 55.6 mL/min (by C-G formula based on Cr of  1.07). ------------------------------------------------------------------------------------------------------------------  Recent Labs  05/28/14 0758  HGBA1C 5.8*   ------------------------------------------------------------------------------------------------------------------  Recent Labs  05/28/14 0758  CHOL 196  HDL 59  LDLCALC 112*  TRIG 123  CHOLHDL 3.3   ------------------------------------------------------------------------------------------------------------------  Recent Labs  05/28/14 0758  TSH 2.000   ------------------------------------------------------------------------------------------------------------------ No results for input(s): VITAMINB12, FOLATE, FERRITIN, TIBC, IRON, RETICCTPCT in the last 72 hours.  Coagulation profile No results for input(s): INR, PROTIME in the last 168 hours.  No results for input(s): DDIMER in the last 72 hours.  Cardiac Enzymes No results for input(s): CKMB, TROPONINI, MYOGLOBIN in the last 168 hours.  Invalid input(s): CK ------------------------------------------------------------------------------------------------------------------ Invalid input(s): POCBNP   Recent Labs  05/28/14 0751  GLUCAP 83     Boston Catarino M.D. [facility] 05/29/2014, 2:31 PM  Pager: [phone]  Between 7am to 7pm - call Pager - [phone]  After 7pm go to [email or web address] - password [email or web address]  Call night coverage person covering after 7pm

## 2014-05-29 NOTE — Progress Notes (Signed)
Chaplain responded to code call.  Explored anxiety:  Helped Kayla [name] gain insight into the origins and effects of her anxiety.  Identify the hurt and fear that underlies her expressed emotions.  Explored self-images issues:  Listened to her identify and explore the effects of connections between mother, husband and current breach in relationships.  Referral to Gardner Candlehaplain Kayla [name] to follow-up with Kayla [name] today. I will also follow-up with Kayla [name] tomorrow and as needed.   05/29/14 1700  Clinical Encounter Type  Visited With Patient and family together  Visit Type Spiritual support;Code  Referral From Chaplain  Spiritual Encounters  Spiritual Needs Emotional

## 2014-05-29 NOTE — Progress Notes (Signed)
Subjective: Patient is feeling better but still has left arm and leg weakness. She does feel some improvement. Awaiting PT.   Objective: Current vital signs: BP 102/41 mmHg  Pulse 76  Temp(Src) 97.8 F (36.6 C) (Oral)  Resp 18  Ht  (1.6 m)  Wt 68.04 kg (150 lb)  BMI 26.58 kg/m2  SpO2 98% Vital signs in last 24 hours: Temp:  [97.7 F (36.5 C)-98.6 F (37 C)] 97.8 F (36.6 C) (05/25 0726) Pulse Rate:  [76-86] 76 (05/25 0726) Resp:  [16-18] 18 (05/25 0726) BP: (96-118)/(41-76) 102/41 mmHg (05/25 0726) SpO2:  [98 %] 98 % (05/25 0726)  Intake/Output from previous day: 05/24 0701 - 05/25 0700 In: 1448.3 [P.O.:100; I.V.:1348.3] Out: 2350 [Urine:2350] Intake/Output this shift: Total I/O In: -  Out: 400 [Urine:400] Nutritional status: Diet Carb Modified Fluid consistency:: Thin; Room service appropriate?: Yes  Neurologic Exam:  Mental Status: Alert, oriented, thought content appropriate.  Speech fluent without evidence of aphasia.  Able to follow 3 step commands without difficulty. Cranial Nerves: II: Discs flat bilaterally; Visual fields grossly normal, pupils equal, round, reactive to light and accommodation III,IV, VI: ptosis not present, extra-ocular motions intact bilaterally V,VII: smile symmetric, facial light touch sensation normal bilaterally VIII: hearing normal bilaterally IX,X: uvula rises symmetrically XI: bilateral shoulder shrug XII: midline tongue extension without atrophy or fasciculations  Motor: Right : Upper extremity   5/5    Left:     Upper extremity   3/5  Lower extremity   5/5     Lower extremity   3/5 --with minimal help she is able to lift left side with good strength and it is noted she actually will push down down against my hand. She shows significant give way strength in both UE and LE on left side.  Positive hoover's sign on the left LE.  Tone and bulk:normal tone throughout; no atrophy noted Sensory: Pinprick and light touch intact  throughout, bilaterally Deep Tendon Reflexes:  Right: Upper Extremity   Left: Upper extremity   biceps (C-5 to C-6) 2/4   biceps (C-5 to C-6) 2/4 tricep (C7) 2/4    triceps (C7) 2/4 Brachioradialis (C6) 2/4  Brachioradialis (C6) 2/4  Lower Extremity Lower Extremity  quadriceps (L-2 to L-4) 2/4   quadriceps (L-2 to L-4) 2/4 Achilles (S1) 2/4   Achilles (S1) 2/4  Plantars: Right: downgoing   Left: downgoing Cerebellar: normal finger-to-nose,  normal heel-to-shin test    Lab Results: Basic Metabolic Panel:  Recent Labs Lab 05/27/14 2226 05/28/14 0758  NA 138 138  K 3.4* 3.5  CL 104 105  CO2 24 23  GLUCOSE 120* 86  BUN 7 <5*  CREATININE 1.08* 1.02*  CALCIUM 9.6 9.0    Liver Function Tests:  Recent Labs Lab 05/27/14 2226 05/28/14 0758  AST 21 19  ALT 21 17  ALKPHOS 67 55  BILITOT 0.4 0.6  PROT 8.1 6.5  ALBUMIN 4.4 3.8   No results for input(s): LIPASE, AMYLASE in the last 168 hours. No results for input(s): AMMONIA in the last 168 hours.  CBC:  Recent Labs Lab 05/27/14 2226 05/28/14 0758 05/29/14 0723  WBC 9.7 8.8 7.0  NEUTROABS 5.5 5.4  --   HGB 13.9 12.7 12.8  HCT 41.4 36.9 39.3  MCV 87.5 86.6 88.1  PLT 257 241 236    Cardiac Enzymes: No results for input(s): CKTOTAL, CKMB, CKMBINDEX, TROPONINI in the last 168 hours.  Lipid Panel:  Recent Labs Lab 05/28/14 0758  CHOL  196  TRIG 123  HDL 59  CHOLHDL 3.3  VLDL 25  LDLCALC 161112*    CBG:  Recent Labs Lab 05/28/14 0751  GLUCAP 83    Microbiology: Results for orders placed or performed during the hospital encounter of 01/22/13  Urine culture     Status: None   Collection Time: 01/22/13  3:23 PM  Result Value Ref Range Status   Specimen Description URINE, CLEAN CATCH  Final   Special Requests NONE  Final   Culture  Setup Time   Final    01/22/2013 22:56 Performed at [facility]   Colony Count NO GROWTH Performed at [facility]  Final   Culture NO  GROWTH Performed at [facility]  Final   Report Status 01/24/2013 FINAL  Final    Coagulation Studies: No results for input(s): LABPROT, INR in the last 72 hours.  Imaging: Ct Angio Head W/cm &/or Wo Cm  05/28/2014   CLINICAL DATA:  LEFT hemi paresis 3 days ago, subsequent headache and dizziness. History of Amyotrophic lateral sclerosis.  EXAM: CT ANGIOGRAPHY HEAD AND NECK  TECHNIQUE: Multidetector CT imaging of the head and neck was performed using the standard protocol during bolus administration of intravenous contrast. Multiplanar CT image reconstructions and MIPs were obtained to evaluate the vascular anatomy. Carotid stenosis measurements (when applicable) are obtained utilizing NASCET criteria, using the distal internal carotid diameter as the denominator.  CONTRAST:  50mL OMNIPAQUE IOHEXOL 350 MG/ML SOLN  COMPARISON:  CT head and cervical spine May 27, 2014 and MRI brain December 20, 2012  FINDINGS: CT HEAD  The ventricles and sulci are normal for age. No intraparenchymal hemorrhage, mass effect nor midline shift. No acute large vascular territory infarcts. No abnormal intracranial enhancement.  No abnormal extra-axial fluid collections. Basal cisterns are patent.  No skull fracture. The included ocular globes and orbital contents are non-suspicious. LEFT maxillary mucosal thickening. The mastoid air cells are well-aerated. Pneumatized petrous apices.  CTA NECK  Normal appearance of the thoracic arch, normal branch pattern. The origins of the innominate, left Common carotid artery and subclavian artery are widely patent.  Bilateral Common carotid arteries are widely patent, coursing in a straight line fashion. Normal appearance of the carotid bifurcations without hemodynamically significant stenosis by NASCET criteria. Normal appearance of the included internal carotid arteries. Streak artifact from dental amalgam limits assessment of vessels at the skull base.  Codominant vertebral  artery's. Normal appearance of the vertebral arteries, which appear widely patent.  No hemodynamically significant stenosis by NASCET criteria. No dissection, no pseudoaneurysm. No abnormal luminal irregularity. No contrast extravasation.  Soft tissues are non acute; RIGHT parotid sialolith. No acute osseous process though bone windows have not been submitted. C5-6 ACDF with solid interbody fusion. Trace bilateral pleural effusions partially imaged.  CTA HEAD  Anterior circulation: Normal appearance of the cervical internal carotid arteries, petrous, cavernous and supra clinoid internal carotid arteries. Widely patent anterior communicating artery. Normal appearance of the anterior and middle cerebral arteries.  Posterior circulation: Normal appearance of the vertebral arteries, vertebrobasilar junction and basilar artery, as well as main branch vessels. Fetal origin RIGHT posterior cerebral artery. Normal appearance of the posterior cerebral arteries.  No large vessel occlusion, hemodynamically significant stenosis, dissection, luminal irregularity, contrast extravasation or aneurysm within the anterior nor posterior circulation.  IMPRESSION: Negative noncontrast CT head for age.  Negative CTA head and neck.   Electronically Signed   By: Awilda Metroourtnay  Bloomer M.D.   On: 05/28/2014 22:46  Ct Head Wo Contrast  05/27/2014   CLINICAL DATA:  Code stroke for left-sided numbness and 7 p.m. Dizziness and headache.  EXAM: CT HEAD WITHOUT CONTRAST  TECHNIQUE: Contiguous axial images were obtained from the base of the skull through the vertex without intravenous contrast.  COMPARISON:  No comparison head CT available.  Brain MRI 12/20/2012  FINDINGS: Skull and Sinuses:Negative for fracture or destructive process. The mastoids, middle ears, and imaged paranasal sinuses are clear.  Orbits: No acute abnormality.  Brain: No evidence of acute infarction, hemorrhage, hydrocephalus, or mass lesion/mass effect.  These results were  called by telephone at the time of interpretation on 05/27/2014 at 10:32 pm to Dr. Tilden Fossa , who verbally acknowledged these results.  IMPRESSION: Negative head CT.   Electronically Signed   By: Marnee Spring M.D.   On: 05/27/2014 22:33   Ct Angio Neck W/cm &/or Wo/cm  05/28/2014   CLINICAL DATA:  LEFT hemi paresis 3 days ago, subsequent headache and dizziness. History of Amyotrophic lateral sclerosis.  EXAM: CT ANGIOGRAPHY HEAD AND NECK  TECHNIQUE: Multidetector CT imaging of the head and neck was performed using the standard protocol during bolus administration of intravenous contrast. Multiplanar CT image reconstructions and MIPs were obtained to evaluate the vascular anatomy. Carotid stenosis measurements (when applicable) are obtained utilizing NASCET criteria, using the distal internal carotid diameter as the denominator.  CONTRAST:  50mL OMNIPAQUE IOHEXOL 350 MG/ML SOLN  COMPARISON:  CT head and cervical spine May 27, 2014 and MRI brain December 20, 2012  FINDINGS: CT HEAD  The ventricles and sulci are normal for age. No intraparenchymal hemorrhage, mass effect nor midline shift. No acute large vascular territory infarcts. No abnormal intracranial enhancement.  No abnormal extra-axial fluid collections. Basal cisterns are patent.  No skull fracture. The included ocular globes and orbital contents are non-suspicious. LEFT maxillary mucosal thickening. The mastoid air cells are well-aerated. Pneumatized petrous apices.  CTA NECK  Normal appearance of the thoracic arch, normal branch pattern. The origins of the innominate, left Common carotid artery and subclavian artery are widely patent.  Bilateral Common carotid arteries are widely patent, coursing in a straight line fashion. Normal appearance of the carotid bifurcations without hemodynamically significant stenosis by NASCET criteria. Normal appearance of the included internal carotid arteries. Streak artifact from dental amalgam limits assessment  of vessels at the skull base.  Codominant vertebral artery's. Normal appearance of the vertebral arteries, which appear widely patent.  No hemodynamically significant stenosis by NASCET criteria. No dissection, no pseudoaneurysm. No abnormal luminal irregularity. No contrast extravasation.  Soft tissues are non acute; RIGHT parotid sialolith. No acute osseous process though bone windows have not been submitted. C5-6 ACDF with solid interbody fusion. Trace bilateral pleural effusions partially imaged.  CTA HEAD  Anterior circulation: Normal appearance of the cervical internal carotid arteries, petrous, cavernous and supra clinoid internal carotid arteries. Widely patent anterior communicating artery. Normal appearance of the anterior and middle cerebral arteries.  Posterior circulation: Normal appearance of the vertebral arteries, vertebrobasilar junction and basilar artery, as well as main branch vessels. Fetal origin RIGHT posterior cerebral artery. Normal appearance of the posterior cerebral arteries.  No large vessel occlusion, hemodynamically significant stenosis, dissection, luminal irregularity, contrast extravasation or aneurysm within the anterior nor posterior circulation.  IMPRESSION: Negative noncontrast CT head for age.  Negative CTA head and neck.   Electronically Signed   By: Awilda Metro M.D.   On: 05/28/2014 22:46   Ct Cervical Spine Wo Contrast  05/27/2014   CLINICAL DATA:  Left-sided numbness and 7 p.m. Headache, dizziness, stable episode.  EXAM: CT CERVICAL SPINE WITHOUT CONTRAST  TECHNIQUE: Multidetector CT imaging of the cervical spine was performed without intravenous contrast. Multiplanar CT image reconstructions were also generated.  COMPARISON:  Head CT- earlier same day  FINDINGS: C1 to the superior endplate of T2 is imaged.  There is straightening of the expected cervical lordosis. No anterolisthesis or retrolisthesis. The dens is normally positioned and a lateral masses of C1.  Normal atlantodental and atlantoaxial articulations. The bilateral facets are normally aligned.  Post C5-C6 ACDF and interbody fusion without evidence of hardware failure or loosening.  Cervical vertebral body heights are preserved. Prevertebral soft tissues are normal.  There is mild multilevel cervical spine DDD, worse at C4-C5 and C6-C7 with disc space height loss, endplate irregularity and sclerosis.  Scattered shotty bilateral cervical lymph nodes are individually not enlarged by size criteria. Normal noncontrast appearance of the thyroid gland. Limited visualization of lung apices is normal.  IMPRESSION: 1. No fracture or static subluxation of the cervical spine. 2. Post C5-C6 ACDF and interbody fusion without evidence of hardware failure or loosening. 3. 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   Electronically Signed   By: Simonne Come M.D.   On: 05/27/2014 23:40    Medications:  Scheduled: . buPROPion  300 mg Oral Daily  . clonazePAM  1 mg Oral BID  . enoxaparin (LOVENOX) injection  40 mg Subcutaneous Q24H  . ezetimibe  10 mg Oral Daily  . topiramate  25 mg Oral BID  . traZODone  100-200 mg Oral QHS    Assessment/Plan:  54 year old female presenting with left sided weakness. Neurological examination continues to show  multiple functional features. Head CTA and neck CTA WNL.  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  No further in patient neurological work up warranted.    Recommend: 1) psychiatry consult or follow up.   Neurology S/O    Felicie Morn PA-C [facility] [phone]  05/29/2014, 8:56 AM   Agree with above assessment and plan. Patients exam is consistent with a functional neurological exam. Per review of records she has seen multiple neurologists with a suspected diagnosis of functional neurological exam. Patient will benefit from psychiatry evaluation. No further neurological workup indicated.    Elspeth Cho, DO [facility] [phone]  If 7pm- 7am, please page neurology on call as listed in [facility]ION.

## 2014-05-30 DIAGNOSIS — F4323 Adjustment disorder with mixed anxiety and depressed mood: Secondary | ICD-10-CM

## 2014-05-30 DIAGNOSIS — M6289 Other specified disorders of muscle: Secondary | ICD-10-CM

## 2014-05-30 DIAGNOSIS — R51 Headache: Secondary | ICD-10-CM

## 2014-05-30 MED ORDER — GABAPENTIN 300 MG PO CAPS
300.0000 mg | ORAL_CAPSULE | Freq: Three times a day (TID) | ORAL | Status: DC
  Administered 2014-05-30 – 2014-05-31 (×4): 300 mg via ORAL
  Filled 2014-05-30 (×5): qty 1

## 2014-05-30 MED ORDER — KETOROLAC TROMETHAMINE 60 MG/2ML IM SOLN
60.0000 mg | Freq: Once | INTRAMUSCULAR | Status: AC | PRN
  Administered 2014-05-30: 60 mg via INTRAMUSCULAR
  Filled 2014-05-30 (×2): qty 2

## 2014-05-30 NOTE — Evaluation (Signed)
Occupational Therapy Evaluation Patient Details Name: Kayla [name] MRN: [id number] DOB: [birth year] Today's Date: 05/30/2014    History of Present Illness Pt is a 54 y.o. Female admitted 05/27/14 with episode of acute onset dizziness and fall at home. 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   Clinical Impression   PTA pt lived at home and reports independence with use of DME/AD. However husband reports he has been assisting as she has been feeling weaker and fatigued. Pt currently limited with independence in ADLs and functional mobility. Pt has impaired LUE, however MMT was inconsistent as pt is able to use LUE functionally during automatic movements (bed mobility, self-feeding). Pt was somewhat resistive to MMT as well. Feel that Neuro Psych consult would be extremely beneficial. Of note, pt has reported that she has 24/7 care however this is apparently an older neighbor on home O2 who may not be able to provide physical assist. Am recommending SNF at this time due to safety concerns. Husband has indicated he is concerned about the ability to care for pt. Pt will benefit from acute OT to address functional mobility, ADLs, and LUE impairment. Feel that there is a strong psychological component. Encouraged pt to utilize hospital resources.     Follow Up Recommendations  SNF;Supervision/Assistance - 24 hour    Equipment Recommendations  None recommended by OT    Recommendations for Other Services Other (comment) (Psych consult)     Precautions / Restrictions Precautions Precautions: Fall Restrictions Weight Bearing Restrictions: No      Mobility Bed Mobility Overal bed mobility: Needs Assistance Bed Mobility: Supine to Sit     Supine to sit: Min guard     General bed mobility comments: Min Guard to get EOB. Pt using Bil UEs to assist to EOB. No physical assist  needed. Increased time and use of bed rail.   Transfers Overall transfer level: Needs assistance Equipment used: Rolling walker (2 wheeled) Transfers: Sit to/from [employer] Sit to Stand: Min assist Stand pivot transfers: Min assist       General transfer comment: Min A to steady during rise. Assist to place LUE on RW correctly. Pt was able to take pivotal steps to recliner chair with min A. Dragging left foot and not picking it up.          ADL Overall ADL's : Needs assistance/impaired Eating/Feeding: Sitting;Modified independent Eating/Feeding Details (indicate cue type and reason): Pt using Bil UEs to feed self when OT arrived.  Grooming: Modified independent;Sitting   Upper Body Bathing: Minimal assitance;Sitting   Lower Body Bathing: Sit to/from stand;Minimal assistance   Upper Body Dressing : Moderate assistance;Sitting   Lower Body Dressing: Moderate assistance;Sit to/from stand Lower Body Dressing Details (indicate cue type and reason): pt able to reach LEs through figure four method. Min A to stand.  Toilet Transfer: Minimal Administrator Details (indicate cue type and reason): EOB>recliner           General ADL Comments: Pt participated in therapeutic exercises for LUE sitting EOB. Pt using RUE for exercises instead of LUE (impaired arm) at times despite VC's for left arm. Unclear whether this is cognitive or purposeful? Pt reports that she sees a Psychiatrist regularly who "saved my life" and encouraged her to notify psychiatrist that she is in the hospital and to call to talk with her. Also encouraged pt to utilize [facility] as needed.  Vision Additional Comments: No change from baseline.           Pertinent Vitals/Pain Pain Assessment: No/denies pain     Hand Dominance Right   Extremity/Trunk Assessment Upper Extremity Assessment Upper Extremity Assessment: LUE deficits/detail LUE Deficits /  Details: MMT inconsistent. Able to use Bil UEs to assist to EOB, however pt unable to perform full AROM of shoulder flexion (only to about 80* and slowly). However, when positioned in full shoulder flexion, pt able to hold UE in place and slowly lower it to lap. Pt moving UE slowly, but then able to use to hold large cup and bring it to her mouth without difficulty and normal speed.    Lower Extremity Assessment Lower Extremity Assessment: Defer to PT evaluation   Cervical / Trunk Assessment Cervical / Trunk Assessment: Normal   Communication Communication Communication: No difficulties;Other (comment) (emotionally labile)   Cognition Arousal/Alertness: Awake/alert Behavior During Therapy: WFL for tasks assessed/performed (tearful) Overall Cognitive Status: Within Functional Limits for tasks assessed                 General Comments: Pt at times using R hand for therapeutic exercises rather than LUE even after multiple events of VC's. Question if this is purposeful. Pt is tearful throughout session.    General Comments       Exercises Exercises: Other exercises Other Exercises Other Exercises: shoulder elevation, depression, retraction, and protraction x5 each. Encouraged to complete in supine or sitting.  Other Exercises: Pt performed lap slides x10 slowly but with no difficulty.  Other Exercises: Pt performed shoulder flexion with min A at elbow. Noted some resistance to assistance. x10 to 90*   Shoulder Instructions      Home Living Family/patient expects to be discharged to:: Private residence Living Arrangements: Spouse/significant other Available Help at Discharge: Family;Available PRN/intermittently (neighbor is close, but does not have 24/7 care) Type of Home: House Home Access: Level entry     Home Layout: One level     Bathroom Shower/Tub: Walk-in shower         Home Equipment: Environmental consultantWalker - 2 wheels;Walker - 4 wheels;Cane - single point;Wheelchair - manual    Additional Comments: says her walker is "bulky" and difficult to use. Has handicapped accessible house due to diagnosis.      Prior Functioning/Environment Level of Independence: Independent with assistive device(s)        Comments: Pt reports that she was independent with DME/AD. She reports she has needed husband's assist recently due to weakness.     OT Diagnosis: Generalized weakness   OT Problem List: Decreased strength;Decreased activity tolerance;Impaired balance (sitting and/or standing);Decreased safety awareness;Impaired UE functional use;Pain   OT Treatment/Interventions: Self-care/ADL training;Therapeutic exercise;Neuromuscular education;Energy conservation;DME and/or AE instruction;Therapeutic activities;Patient/family education;Balance training    OT Goals(Current goals can be found in the care plan section) Acute Rehab OT Goals Patient Stated Goal: to go home OT Goal Formulation: With patient Time For Goal Achievement: 06/13/14 Potential to Achieve Goals: Good ADL Goals Pt Will Perform Eating: Independently;sitting Pt Will Perform Grooming: standing;with supervision Pt Will Perform Upper Body Bathing: with supervision;sitting Pt Will Perform Upper Body Dressing: with supervision;sitting Pt Will Transfer to Toilet: with supervision;ambulating Pt Will Perform Toileting - Clothing Manipulation and hygiene: with supervision;sit to/from stand Pt/caregiver will Perform Home Exercise Program: Increased strength;Left upper extremity;With Supervision  OT Frequency: Min 2X/week   Barriers to D/C: Decreased caregiver support             End of  Session Equipment Utilized During Treatment: Rolling walker;Gait belt Nurse Communication: Mobility status  Activity Tolerance: Patient tolerated treatment well;Patient limited by fatigue Patient left: in chair;with call bell/phone within reach;with chair alarm set;with family/visitor present   Time: 0916-1000 OT Time  Calculation (min): 44 min Charges:  OT General Charges $OT Visit: 1 Procedure OT Evaluation $Initial OT Evaluation Tier I: 1 Procedure OT Treatments $Self Care/Home Management : 8-22 mins $Therapeutic Exercise: 8-22 mins G-Codes: OT G-codes **NOT FOR INPATIENT CLASS** Functional Assessment Tool Used: clinical judgement Functional Limitation: Self care Self Care Current Status (Z6109): At least 40 percent but less than 60 percent impaired, limited or restricted Self Care Goal Status (U0454): At least 1 percent but less than 20 percent impaired, limited or restricted  Rae Lips 05/30/2014, 10:30 AM

## 2014-05-30 NOTE — Progress Notes (Signed)
Chaplain follow-up  Stopped by this morning met husband and spoke with nurse Bailey Mech. Kayla [name] and husband had some questions, but nurse cleared some of that up with myself and lead chaplain Ray.   Concerns:  What Kayla [name] can eat or drink. Going to out patient care.  Explored faith and belief issues: Reflected on current life successes and the connections between her faith and her current circumstances.  Chaplain listen attentively and reflectively as patient shared her beliefs and theological understands of her personal life events.  Kayla [name] was in a much better place today, we laughed and talked about her two dogs and flower garden.   Will follow-up as needed.   05/30/14 1300  Clinical Encounter Type  Visited With Patient and family together;Health care provider  Visit Type Follow-up;Spiritual support  Referral From [facility] Needs Emotional

## 2014-05-30 NOTE — Progress Notes (Signed)
[facility]                                                                              Patient Demographics  Kayla [name], is a 54 y.o. female, DOB - 10/12/1960, ZOX:[id number]  Admit date - 05/27/2014   Admitting Physician Eduard Clos, MD  Outpatient Primary MD for the patient is Caffie Damme, MD  LOS - 3   Chief Complaint  Patient presents with  . Numbness       Brief HPI   Patient is a 54 year old female with migraine and previous diagnosis of ALS which was presently rediagnosed as  functional neurological disorder was brought to the ER after patient was found to have left-sided weakness. Patient reported that evening of admission around 7 PM while was preparing dinner she suddenly started developing left-sided weakness and headache and lost control of full left side. In the ER CT head and C-spine were unremarkable and patient was brought to [facility] for further workup. Patient also reported having some frontal headaches since last evening. Denied any blurred vision difficulty speaking swallowing. Neurologist on call Dr. Thad Ranger was consulted. Patient denied any chest pain or shortness of breath.     Assessment & Plan      Left-sided weakness; unclear etiology- suspect due to functional neuro d/o (diagnosed at [facility]) - Patient was seen by neurology, felt to have multiple functional features - CT angiogram of the head and neck negative - EEG normal with no seizure or seizure predisposition - PT evaluation recommended home health physical therapy with 24 hour care - Neurology recommending psych evaluation - Psychiatry consulted     Headache - Tramadol as needed for pain -PRN toradol  Diarrhea - Obtain stool studies, if C. difficile negative, will place on Imodium  - Patient reports chronic, has seen Dr. Elnoria Howard in the past, recommended to follow up with GI  Hypokalemia - Likely due to diarrhea, replaced  Anxiety Continue Wellbutrin,  Klonopin Psychiatry consulted  Hyperlipidemia Continue Zetia   Code Status: Full code  Family Communication: Discussed in detail with the patient, all imaging results, lab results explained to the patient and husband  Disposition Plan: await psych evaluation  Time Spent in minutes  25 minutes  Procedures  CTA head and neck EEG  Consults   Neurology Psychiatry  DVT Prophylaxis  Lovenox  Medications  Scheduled Meds: . buPROPion  300 mg Oral Daily  . clonazePAM  1 mg Oral BID  . enoxaparin (LOVENOX) injection  40 mg Subcutaneous Q24H  . ezetimibe  10 mg Oral Daily  . topiramate  25 mg Oral BID  . traZODone  100-200 mg Oral QHS   Continuous Infusions:  PRN Meds:.ketorolac, SUMAtriptan, traMADol   Antibiotics   Anti-infectives    None        Subjective:   Kayla [name] c/o severe migraine headache  -patient states she can move extremities as long as she does not think about it Feels as if we are not taking her functional neuro d/o seriously   Objective:   Blood pressure 100/52, pulse 69, temperature 98.2 F (36.8 C), temperature source Oral, resp.  rate 13, height  (1.6 m), weight 68.04 kg (150 lb), SpO2 98 %.  Wt Readings from Last 3 Encounters:  05/27/14 68.04 kg (150 lb)  03/21/13 58.968 kg (130 lb)  02/24/13 56.246 kg (124 lb)     Intake/Output Summary (Last 24 hours) at 05/30/14 1435 Last data filed at 05/30/14 1400  Gross per 24 hour  Intake    700 ml  Output   3625 ml  Net  -2925 ml    Exam  General: Alert and oriented x 3, NAD  CVS: S1 S2 auscultated, no rubs, murmurs or gallops. Regular rate and rhythm.  Respiratory: Clear to auscultation bilaterally, no wheezing, rales or rhonchi  Abdomen: Soft, nontender, nondistended, + bowel sounds  Ext: no cyanosis clubbing or edema  Moving all 4 ext    Data Review   Micro Results No results found for this or any previous visit (from the past 240 hour(s)).  Radiology Reports Ct  Angio Head W/cm &/or Wo Cm  05/28/2014   CLINICAL DATA:  LEFT hemi paresis 3 days ago, subsequent headache and dizziness. History of Amyotrophic lateral sclerosis.  EXAM: CT ANGIOGRAPHY HEAD AND NECK  TECHNIQUE: Multidetector CT imaging of the head and neck was performed using the standard protocol during bolus administration of intravenous contrast. Multiplanar CT image reconstructions and MIPs were obtained to evaluate the vascular anatomy. Carotid stenosis measurements (when applicable) are obtained utilizing NASCET criteria, using the distal internal carotid diameter as the denominator.  CONTRAST:  50mL OMNIPAQUE IOHEXOL 350 MG/ML SOLN  COMPARISON:  CT head and cervical spine May 27, 2014 and MRI brain December 20, 2012  FINDINGS: CT HEAD  The ventricles and sulci are normal for age. No intraparenchymal hemorrhage, mass effect nor midline shift. No acute large vascular territory infarcts. No abnormal intracranial enhancement.  No abnormal extra-axial fluid collections. Basal cisterns are patent.  No skull fracture. The included ocular globes and orbital contents are non-suspicious. LEFT maxillary mucosal thickening. The mastoid air cells are well-aerated. Pneumatized petrous apices.  CTA NECK  Normal appearance of the thoracic arch, normal branch pattern. The origins of the innominate, left Common carotid artery and subclavian artery are widely patent.  Bilateral Common carotid arteries are widely patent, coursing in a straight line fashion. Normal appearance of the carotid bifurcations without hemodynamically significant stenosis by NASCET criteria. Normal appearance of the included internal carotid arteries. Streak artifact from dental amalgam limits assessment of vessels at the skull base.  Codominant vertebral artery's. Normal appearance of the vertebral arteries, which appear widely patent.  No hemodynamically significant stenosis by NASCET criteria. No dissection, no pseudoaneurysm. No abnormal luminal  irregularity. No contrast extravasation.  Soft tissues are non acute; RIGHT parotid sialolith. No acute osseous process though bone windows have not been submitted. C5-6 ACDF with solid interbody fusion. Trace bilateral pleural effusions partially imaged.  CTA HEAD  Anterior circulation: Normal appearance of the cervical internal carotid arteries, petrous, cavernous and supra clinoid internal carotid arteries. Widely patent anterior communicating artery. Normal appearance of the anterior and middle cerebral arteries.  Posterior circulation: Normal appearance of the vertebral arteries, vertebrobasilar junction and basilar artery, as well as main branch vessels. Fetal origin RIGHT posterior cerebral artery. Normal appearance of the posterior cerebral arteries.  No large vessel occlusion, hemodynamically significant stenosis, dissection, luminal irregularity, contrast extravasation or aneurysm within the anterior nor posterior circulation.  IMPRESSION: Negative noncontrast CT head for age.  Negative CTA head and neck.   Electronically Signed  By: Awilda Metroourtnay  Bloomer M.D.   On: 05/28/2014 22:46   Ct Head Wo Contrast  05/27/2014   CLINICAL DATA:  Code stroke for left-sided numbness and 7 p.m. Dizziness and headache.  EXAM: CT HEAD WITHOUT CONTRAST  TECHNIQUE: Contiguous axial images were obtained from the base of the skull through the vertex without intravenous contrast.  COMPARISON:  No comparison head CT available.  Brain MRI 12/20/2012  FINDINGS: Skull and Sinuses:Negative for fracture or destructive process. The mastoids, middle ears, and imaged paranasal sinuses are clear.  Orbits: No acute abnormality.  Brain: No evidence of acute infarction, hemorrhage, hydrocephalus, or mass lesion/mass effect.  These results were called by telephone at the time of interpretation on 05/27/2014 at 10:32 pm to Dr. Tilden FossaELIZABETH REES , who verbally acknowledged these results.  IMPRESSION: Negative head CT.   Electronically Signed   By:  Marnee SpringJonathon  Watts M.D.   On: 05/27/2014 22:33   Ct Angio Neck W/cm &/or Wo/cm  05/28/2014   CLINICAL DATA:  LEFT hemi paresis 3 days ago, subsequent headache and dizziness. History of Amyotrophic lateral sclerosis.  EXAM: CT ANGIOGRAPHY HEAD AND NECK  TECHNIQUE: Multidetector CT imaging of the head and neck was performed using the standard protocol during bolus administration of intravenous contrast. Multiplanar CT image reconstructions and MIPs were obtained to evaluate the vascular anatomy. Carotid stenosis measurements (when applicable) are obtained utilizing NASCET criteria, using the distal internal carotid diameter as the denominator.  CONTRAST:  50mL OMNIPAQUE IOHEXOL 350 MG/ML SOLN  COMPARISON:  CT head and cervical spine May 27, 2014 and MRI brain December 20, 2012  FINDINGS: CT HEAD  The ventricles and sulci are normal for age. No intraparenchymal hemorrhage, mass effect nor midline shift. No acute large vascular territory infarcts. No abnormal intracranial enhancement.  No abnormal extra-axial fluid collections. Basal cisterns are patent.  No skull fracture. The included ocular globes and orbital contents are non-suspicious. LEFT maxillary mucosal thickening. The mastoid air cells are well-aerated. Pneumatized petrous apices.  CTA NECK  Normal appearance of the thoracic arch, normal branch pattern. The origins of the innominate, left Common carotid artery and subclavian artery are widely patent.  Bilateral Common carotid arteries are widely patent, coursing in a straight line fashion. Normal appearance of the carotid bifurcations without hemodynamically significant stenosis by NASCET criteria. Normal appearance of the included internal carotid arteries. Streak artifact from dental amalgam limits assessment of vessels at the skull base.  Codominant vertebral artery's. Normal appearance of the vertebral arteries, which appear widely patent.  No hemodynamically significant stenosis by NASCET criteria. No  dissection, no pseudoaneurysm. No abnormal luminal irregularity. No contrast extravasation.  Soft tissues are non acute; RIGHT parotid sialolith. No acute osseous process though bone windows have not been submitted. C5-6 ACDF with solid interbody fusion. Trace bilateral pleural effusions partially imaged.  CTA HEAD  Anterior circulation: Normal appearance of the cervical internal carotid arteries, petrous, cavernous and supra clinoid internal carotid arteries. Widely patent anterior communicating artery. Normal appearance of the anterior and middle cerebral arteries.  Posterior circulation: Normal appearance of the vertebral arteries, vertebrobasilar junction and basilar artery, as well as main branch vessels. Fetal origin RIGHT posterior cerebral artery. Normal appearance of the posterior cerebral arteries.  No large vessel occlusion, hemodynamically significant stenosis, dissection, luminal irregularity, contrast extravasation or aneurysm within the anterior nor posterior circulation.  IMPRESSION: Negative noncontrast CT head for age.  Negative CTA head and neck.   Electronically Signed   By: Michel Santeeourtnay  Bloomer M.D.  On: 05/28/2014 22:46   Ct Cervical Spine Wo Contrast  05/27/2014   CLINICAL DATA:  Left-sided numbness and 7 p.m. Headache, dizziness, stable episode.  EXAM: CT CERVICAL SPINE WITHOUT CONTRAST  TECHNIQUE: Multidetector CT imaging of the cervical spine was performed without intravenous contrast. Multiplanar CT image reconstructions were also generated.  COMPARISON:  Head CT- earlier same day  FINDINGS: C1 to the superior endplate of T2 is imaged.  There is straightening of the expected cervical lordosis. No anterolisthesis or retrolisthesis. The dens is normally positioned and a lateral masses of C1. Normal atlantodental and atlantoaxial articulations. The bilateral facets are normally aligned.  Post C5-C6 ACDF and interbody fusion without evidence of hardware failure or loosening.  Cervical  vertebral body heights are preserved. Prevertebral soft tissues are normal.  There is mild multilevel cervical spine DDD, worse at C4-C5 and C6-C7 with disc space height loss, endplate irregularity and sclerosis.  Scattered shotty bilateral cervical lymph nodes are individually not enlarged by size criteria. Normal noncontrast appearance of the thyroid gland. Limited visualization of lung apices is normal.  IMPRESSION: 1. No fracture or static subluxation of the cervical spine. 2. Post C5-C6 ACDF and interbody fusion without evidence of hardware failure or loosening. 3. 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   Electronically Signed   By: Simonne Come M.D.   On: 05/27/2014 23:40    CBC  Recent Labs Lab 05/27/14 2226 05/28/14 0758 05/29/14 0723  WBC 9.7 8.8 7.0  HGB 13.9 12.7 12.8  HCT 41.4 36.9 39.3  PLT 257 241 236  MCV 87.5 86.6 88.1  MCH 29.4 29.8 28.7  MCHC 33.6 34.4 32.6  RDW 12.7 13.0 13.0  LYMPHSABS 3.1 2.6  --   MONOABS 0.8 0.6  --   EOSABS 0.2 0.2  --   BASOSABS 0.0 0.0  --     Chemistries   Recent Labs Lab 05/27/14 2226 05/28/14 0758 05/29/14 0723  NA 138 138 138  K 3.4* 3.5 3.3*  CL 104 105 109  CO2 24 23 19*  GLUCOSE 120* 86 137*  BUN 7 <5* 5*  CREATININE 1.08* 1.02* 1.07*  CALCIUM 9.6 9.0 9.1  AST 21 19  --   ALT 21 17  --   ALKPHOS 67 55  --   BILITOT 0.4 0.6  --    ------------------------------------------------------------------------------------------------------------------ estimated creatinine clearance is 55.6 mL/min (by C-G formula based on Cr of 1.07). ------------------------------------------------------------------------------------------------------------------  Recent Labs  05/28/14 0758  HGBA1C 5.8*   ------------------------------------------------------------------------------------------------------------------  Recent Labs  05/28/14 0758  CHOL 196  HDL 59  LDLCALC 112*  TRIG 123  CHOLHDL 3.3    ------------------------------------------------------------------------------------------------------------------  Recent Labs  05/28/14 0758  TSH 2.000   ------------------------------------------------------------------------------------------------------------------ No results for input(s): VITAMINB12, FOLATE, FERRITIN, TIBC, IRON, RETICCTPCT in the last 72 hours.  Coagulation profile No results for input(s): INR, PROTIME in the last 168 hours.  No results for input(s): DDIMER in the last 72 hours.  Cardiac Enzymes No results for input(s): CKMB, TROPONINI, MYOGLOBIN in the last 168 hours.  Invalid input(s): CK ------------------------------------------------------------------------------------------------------------------ Invalid input(s): POCBNP   Recent Labs  05/28/14 0751  GLUCAP 83     Sully Dyment DO [facility] 05/30/2014, 2:35 PM  Pager: [phone]  After 7pm go to [email or web address] - password [email or web address]  Call night coverage person covering after 7pm
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
## 2014-05-31 DIAGNOSIS — F4323 Adjustment disorder with mixed anxiety and depressed mood: Secondary | ICD-10-CM | POA: Diagnosis not present

## 2014-05-31 DIAGNOSIS — R51 Headache: Secondary | ICD-10-CM | POA: Diagnosis not present

## 2014-05-31 DIAGNOSIS — M6289 Other specified disorders of muscle: Secondary | ICD-10-CM | POA: Diagnosis not present

## 2014-05-31 LAB — CBC
HCT: 39.7 % (ref 36.0–46.0)
HEMOGLOBIN: 13.2 g/dL (ref 12.0–15.0)
MCH: 29.1 pg (ref 26.0–34.0)
MCHC: 33.2 g/dL (ref 30.0–36.0)
MCV: 87.6 fL (ref 78.0–100.0)
PLATELETS: 247 10*3/uL (ref 150–400)
RBC: 4.53 MIL/uL (ref 3.87–5.11)
RDW: 13 % (ref 11.5–15.5)
WBC: 7.1 10*3/uL (ref 4.0–10.5)

## 2014-05-31 LAB — BASIC METABOLIC PANEL
ANION GAP: 9 (ref 5–15)
BUN: 8 mg/dL (ref 6–20)
CALCIUM: 9.4 mg/dL (ref 8.9–10.3)
CHLORIDE: 104 mmol/L (ref 101–111)
CO2: 25 mmol/L (ref 22–32)
Creatinine, Ser: 1.15 mg/dL — ABNORMAL HIGH (ref 0.44–1.00)
GFR calc Af Amer: >60 mL/min (ref >60)
GFR calc non Af Amer: 53 mL/min — ABNORMAL LOW (ref >60)
Glucose, Bld: 95 mg/dL (ref 65–99)
Potassium: 3.5 mmol/L (ref 3.5–5.1)
SODIUM: 138 mmol/L (ref 135–145)

## 2014-05-31 LAB — CORTISOL-AM, BLOOD: CORTISOL - AM: 11.1 ug/dL (ref 6.7–22.6)

## 2014-05-31 MED ORDER — GABAPENTIN 300 MG PO CAPS: 300.0000 mg | ORAL_CAPSULE | Freq: Three times a day (TID) | ORAL | Status: DC

## 2014-05-31 MED ORDER — SUMATRIPTAN SUCCINATE 100 MG PO TABS: 100.0000 mg | ORAL_TABLET | ORAL | Status: DC | PRN

## 2014-05-31 NOTE — Progress Notes (Addendum)
Occupational Therapy Treatment Patient Details Name: Kayla [name] MRN: [id number] DOB: Feb 24, 1960 Today's Date: 05/31/2014    History of present illness Pt is a 54 y.o. Female admitted 05/27/14 with episode of acute onset dizziness and fall at home. 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   OT comments  Pt. Progressing well with skilled OT.  Amb. In room and completing functional transfers with min guard a.  Expressing wishes to return home.  Previous notes indicate that 24/7 would be needed and may not be available which pt. Would then need SNF placement.  Still agree with these recommendations for level of 24/7S needed at d/c or SNF if not available.  Orthostatics taken per CNA request: Supine: 86/49 Sitting: 126/47 Standing: 102/58    Follow Up Recommendations  SNF;Supervision/Assistance - 24 hour    Equipment Recommendations  None recommended by OT    Recommendations for Other Services      Precautions / Restrictions Precautions Precautions: Fall       Mobility Bed Mobility Overal bed mobility: Modified Independent Bed Mobility: Rolling;Sidelying to Sit;Supine to Sit Rolling: Modified independent (Device/Increase time) Sidelying to sit: Modified independent (Device/Increase time) Supine to sit: Modified independent (Device/Increase time)     General bed mobility comments: utilized bed rails to pull toward eob with b ues  Transfers Overall transfer level: Needs assistance Equipment used: Rolling walker (2 wheeled) Transfers: Sit to/from [employer] Sit to Stand: Min guard Stand pivot transfers: Min guard       General transfer comment: pt. ambulated from eob around the bed to the recliner, LLE not dragging as previously documented, more active steps still slow but not dragging    Balance                                   ADL   Eating/Feeding: Set up;Bed level                       Toilet Transfer: Min guard;Ambulation;RW Toilet Transfer Details (indicate cue type and reason): simulated from eob to recliner, states she usually uses rollator and prefers it so she can take rest breaks           General ADL Comments: actively enganging use of LUE without cues or hesitation.        Vision                     Perception     Praxis      Cognition   Behavior During Therapy: Flat affect Overall Cognitive Status: Within Functional Limits for tasks assessed                  General Comments: using LUE and RUE throughout session without cueing or mention.  only mentioned LUE at end of session stating "my left arm is getting better dont you think"    Extremity/Trunk Assessment               Exercises     Shoulder Instructions       General Comments      Pertinent Vitals/ Pain       Pain Assessment: No/denies pain  Home Living  Prior Functioning/Environment              Frequency Min 2X/week     Progress Toward Goals  OT Goals(current goals can now be found in the care plan section)  Progress towards OT goals: Progressing toward goals     Plan Discharge plan remains appropriate    Co-evaluation                 End of Session Equipment Utilized During Treatment: Rolling walker   Activity Tolerance Patient tolerated treatment well   Patient Left in chair;with call bell/phone within reach   Nurse Communication          Time: [id number] OT Time Calculation (min): 14 min  Charges: OT General Charges $OT Visit: 1 Procedure OT Treatments $Self Care/Home Management : 8-22 mins  Robet LeuMorris, Meldrick Buttery Lorraine, COTA/L 05/31/2014, 10:34 AM

## 2014-05-31 NOTE — Progress Notes (Signed)
Physical Therapy Treatment Patient Details Name: Kayla HaggisLisa [name] MRN: [id number] DOB: August 17, 1960 Today's Date: 05/31/2014    History of Present Illness Pt is a 54 y.o. Female admitted 05/27/14 with episode of acute onset dizziness and fall at home. 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    PT Comments    Pt progressing with mobility. Feel pt can return home with intermittent supervision since she is now mobilizing short distances without assistance. Recommend pt initially amb only short distances on her own. Pt reports she had a rollator years ago but that she no longer has it. Noted pt not grasping walker with left hand initially but after treatment observed pt opening containers and packaging on meal tray using both hands and demonstrating fine motor skills.   Follow Up Recommendations  Home health PT;Supervision - Intermittent     Equipment Recommendations  Other (comment) (Rollator)    Recommendations for Other Services       Precautions / Restrictions Precautions Precautions: Fall Restrictions Weight Bearing Restrictions: No    Mobility  Bed Mobility Overal bed mobility: Modified Independent Bed Mobility: Supine to Sit;Sit to Supine Rolling: Modified independent (Device/Increase time) Sidelying to sit: Modified independent (Device/Increase time) Supine to sit: Modified independent (Device/Increase time);HOB elevated Sit to supine: Modified independent (Device/Increase time)   General bed mobility comments: Used BUE's to help bring left leg back up into bed  Transfers Overall transfer level: Modified independent Equipment used: 4-wheeled walker Transfers: Sit to/from Stand Sit to Stand: Modified independent (Device/Increase time) Stand pivot transfers: Min guard       General transfer comment: Pt able to lock/unlock brakes of rollator on her own without  any cues.  Ambulation/Gait Ambulation/Gait assistance: Modified independent (Device/Increase time);Supervision Ambulation Distance (Feet): 70 Feet (x 2) Assistive device: 4-wheeled walker Gait Pattern/deviations: Decreased step length - left;Decreased dorsiflexion - left   Gait velocity interpretation: Below normal speed for age/gender General Gait Details: For short distances pt is modified independent. As distance increased pt fatigues and required supervision due to left foot with incr drag. Initially pt just resting lt hand on handle of walker then later was grasping walker.   Stairs            Wheelchair Mobility    Modified Rankin (Stroke Patients Only)       Balance Overall balance assessment: Needs assistance Sitting-balance support: No upper extremity supported;Feet supported Sitting balance-Leahy Scale: Normal     Standing balance support: No upper extremity supported Standing balance-Leahy Scale: Fair                      Cognition Arousal/Alertness: Awake/alert Behavior During Therapy: WFL for tasks assessed/performed Overall Cognitive Status: Within Functional Limits for tasks assessed                 General Comments: using LUE and RUE throughout session without cueing or mention.  only mentioned LUE at end of session stating "my left arm is getting better dont you think"    Exercises      General Comments        Pertinent Vitals/Pain Pain Assessment: No/denies pain    Home Living                      Prior Function            PT Goals (current goals can now be  found in the care plan section) Acute Rehab PT Goals Patient Stated Goal: to go home PT Goal Formulation: With patient Time For Goal Achievement: 06/12/14 Potential to Achieve Goals: Good    Frequency  Min 3X/week    PT Plan Discharge plan needs to be updated    Co-evaluation             End of Session Equipment Utilized During Treatment: Gait  belt Activity Tolerance: Patient tolerated treatment well Patient left: in bed;with call bell/phone within reach     Time: 1238-1300 PT Time Calculation (min) (ACUTE ONLY): 22 min  Charges:  $Gait Training: 8-22 mins                    G Codes:  Functional Assessment Tool Used: Clinical Judgement Functional Limitation: Mobility: Walking and moving around Mobility: Walking and Moving Around Current Status (E4540): At least 20 percent but less than 40 percent impaired, limited or restricted Mobility: Walking and Moving Around Goal Status [id number]): 0 percent impaired, limited or restricted   [facility] 05/31/2014, 2:00 PM  [facility] PT [phone]

## 2014-05-31 NOTE — Care Management Note (Signed)
Case Management Note  Patient Details  Name: Kayla HaggisLisa Mabe MRN: [id number] Date of Birth: 1960/07/20  Subjective/Objective:      Patient is for dc today, [facility] with h[facility] services , [facility], OT, aide and SW.  Soc will begin 24-48 hrs post dc.  NCM spoke with spouse, he states he will be here at hospital to transport patient home by 6:30 today. NCM informed RN.             Action/Plan:   Expected Discharge Date:                  Expected Discharge Plan:  Home w Home Health Services  In-House Referral:     Discharge planning Services  CM Consult  Post Acute Care Choice:    Choice offered to:  Patient  DME Arranged:  Walker rolling DME Agency:  [facility]  HH Arranged:  RN, PT, OT, Nurse's Aide (Child psychotherapistocial Worker) [facility]  Status of Service:  Completed, signed off  Medicare Important Message Given:  No Date Medicare IM Given:    Medicare IM give by:    Date Additional Medicare IM Given:    Additional Medicare Important Message give by:     If discussed at Long Length of Stay Meetings, dates discussed:    Additional Comments:  Leone Havenaylor, Mashayla Lavin Clinton, RN 05/31/2014, 2:41 PM
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
## 2014-05-31 NOTE — Discharge Summary (Signed)
Physician Discharge Summary  Kayla [name] ZOX:[id number] DOB: 25-Feb-1960 DOA: 05/27/2014  PCP: Caffie Damme, MD  Admit date: 05/27/2014 Discharge date: 05/31/2014  Time spent: 35 minutes  Recommendations for Outpatient Follow-up:  1. Home health  Discharge Diagnoses:  Principal Problem:   Adjustment disorder with mixed anxiety and depressed mood Active Problems:   Left-sided weakness   Headache   Discharge Condition: improved  Diet recommendation: cardiac  Filed Weights   05/27/14 2207  Weight: 68.04 kg (150 lb)    History of present illness:  Kayla [name] is a 54 y.o. female with history of migraine and previous diagnosis of ALS which was presently rediagnosed as possible functional neurological disorder was brought to the ER after patient was found to have left-sided weakness. Patient states last evening around 7 PM while was preparing dinner she suddenly started developing left-sided weakness and headache and lost control of full left side. In the ER CT head and C-spine were unremarkable and patient was brought to [facility] for further workup. Patient also has been having some frontal headaches since last evening. Denies any blurred vision difficulty speaking swallowing. Neurologist on call Dr. Thad Ranger has been consulted. Patient denies any chest pain or shortness of breath.   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 (diagnosed at [facility]) - Patient was seen by neurology, felt to have multiple functional features - CT angiogram of the head and neck negative - EEG normal with no seizure or seizure predisposition - PT evaluation recommended home health physical therapy with intermittent supervision - Neurology recommending psych evaluation - Psychiatry consulted  Hypotension -chronic -no symptoms -cortisol ok   Headache - Tramadol as needed for pain -PRN toradol  Diarrhea - resolved doubt infection suspect more IBS as  she has constipation  Hypokalemia - Likely due to diarrhea, replaced  Anxiety Continue Wellbutrin, Klonopin Psychiatry consulted- added neurontin  Hyperlipidemia Continue Zetia  Procedures:    Consultations:  Neuro  psych  Discharge Exam: Filed Vitals:   05/31/14 1048  BP: 105/62  Pulse: 76  Temp: 97.8 F (36.6 C)  Resp: 18    General: A+Ox3, NAD Cardiovascular: rrr Respiratory: clear  Discharge Instructions   Discharge Instructions    Diet general    Complete by:  As directed      Discharge instructions    Complete by:  As directed   Home health Be sure to drink plenty of fluid Can take imodium for diarrhea- needs to follow up with GI doctor, PCP can refer     Increase activity slowly    Complete by:  As directed           Current Discharge Medication List    START taking these medications   Details  gabapentin (NEURONTIN) 300 MG capsule Take 1 capsule (300 mg total) by mouth 3 (three) times daily. Qty: 90 capsule, Refills: 0    SUMAtriptan (IMITREX) 100 MG tablet Take 1 tablet (100 mg total) by mouth every 2 (two) hours as needed for migraine or headache. May repeat in 2 hours if headache persists or recurs. Qty: 10 tablet, Refills: 0      CONTINUE these medications which have NOT CHANGED   Details  buPROPion (WELLBUTRIN XL) 300 MG 24 hr tablet Take 300 mg by mouth daily.    clonazePAM (KLONOPIN) 1 MG tablet Take 1 mg by mouth 2 (two) times daily.     estradiol (ESTRACE) 1 MG tablet Take 1 mg by mouth daily.  ezetimibe (ZETIA) 10 MG tablet Take 10 mg by mouth daily.    oxyCODONE-acetaminophen (PERCOCET/ROXICET) 5-325 MG per tablet Take 1 tablet by mouth 2 (two) times daily as needed for severe pain.    topiramate (TOPAMAX) 50 MG tablet Take 25 mg by mouth 2 (two) times daily.    traZODone (DESYREL) 100 MG tablet Take 100-200 mg by mouth at bedtime.       STOP taking these medications     amoxicillin-clavulanate (AUGMENTIN) 875-125  MG per tablet      clindamycin (CLEOCIN) 150 MG capsule      cyclobenzaprine (FLEXERIL) 10 MG tablet      fluconazole (DIFLUCAN) 150 MG tablet      fluconazole (DIFLUCAN) 200 MG tablet      HYDROcodone-acetaminophen (NORCO) 5-325 MG per tablet      HYDROcodone-acetaminophen (NORCO/VICODIN) 5-325 MG per tablet      ondansetron (ZOFRAN) 4 MG tablet        Allergies  Allergen Reactions  . Dilaudid [Hydromorphone Hcl] Itching  . Codeine Rash   Follow-up Information    Follow up with [name], [name], MD In 1 week.   Specialty:  Family Medicine   Contact information:   [address] [place] [id number] [phone]        The results of significant diagnostics from this hospitalization (including imaging, microbiology, ancillary and laboratory) are listed below for reference.    Significant Diagnostic Studies: Ct Angio Head W/cm &/or Wo Cm  05/28/2014   CLINICAL DATA:  LEFT hemi paresis 3 days ago, subsequent headache and dizziness. History of Amyotrophic lateral sclerosis.  EXAM: CT ANGIOGRAPHY HEAD AND NECK  TECHNIQUE: Multidetector CT imaging of the head and neck was performed using the standard protocol during bolus administration of intravenous contrast. Multiplanar CT image reconstructions and MIPs were obtained to evaluate the vascular anatomy. Carotid stenosis measurements (when applicable) are obtained utilizing NASCET criteria, using the distal internal carotid diameter as the denominator.  CONTRAST:  50mL OMNIPAQUE IOHEXOL 350 MG/ML SOLN  COMPARISON:  CT head and cervical spine May 27, 2014 and MRI brain December 20, 2012  FINDINGS: CT HEAD  The ventricles and sulci are normal for age. No intraparenchymal hemorrhage, mass effect nor midline shift. No acute large vascular territory infarcts. No abnormal intracranial enhancement.  No abnormal extra-axial fluid collections. Basal cisterns are patent.  No skull fracture. The included ocular globes and orbital contents are  non-suspicious. LEFT maxillary mucosal thickening. The mastoid air cells are well-aerated. Pneumatized petrous apices.  CTA NECK  Normal appearance of the thoracic arch, normal branch pattern. The origins of the innominate, left Common carotid artery and subclavian artery are widely patent.  Bilateral Common carotid arteries are widely patent, coursing in a straight line fashion. Normal appearance of the carotid bifurcations without hemodynamically significant stenosis by NASCET criteria. Normal appearance of the included internal carotid arteries. Streak artifact from dental amalgam limits assessment of vessels at the skull base.  Codominant vertebral artery's. Normal appearance of the vertebral arteries, which appear widely patent.  No hemodynamically significant stenosis by NASCET criteria. No dissection, no pseudoaneurysm. No abnormal luminal irregularity. No contrast extravasation.  Soft tissues are non acute; RIGHT parotid sialolith. No acute osseous process though bone windows have not been submitted. C5-6 ACDF with solid interbody fusion. Trace bilateral pleural effusions partially imaged.  CTA HEAD  Anterior circulation: Normal appearance of the cervical internal carotid arteries, petrous, cavernous and supra clinoid internal carotid arteries. Widely patent anterior communicating artery.  Normal appearance of the anterior and middle cerebral arteries.  Posterior circulation: Normal appearance of the vertebral arteries, vertebrobasilar junction and basilar artery, as well as main branch vessels. Fetal origin RIGHT posterior cerebral artery. Normal appearance of the posterior cerebral arteries.  No large vessel occlusion, hemodynamically significant stenosis, dissection, luminal irregularity, contrast extravasation or aneurysm within the anterior nor posterior circulation.  IMPRESSION: Negative noncontrast CT head for age.  Negative CTA head and neck.   Electronically Signed   By: Awilda Metro M.D.   On:  05/28/2014 22:46   Ct Head Wo Contrast  05/27/2014   CLINICAL DATA:  Code stroke for left-sided numbness and 7 p.m. Dizziness and headache.  EXAM: CT HEAD WITHOUT CONTRAST  TECHNIQUE: Contiguous axial images were obtained from the base of the skull through the vertex without intravenous contrast.  COMPARISON:  No comparison head CT available.  Brain MRI 12/20/2012  FINDINGS: Skull and Sinuses:Negative for fracture or destructive process. The mastoids, middle ears, and imaged paranasal sinuses are clear.  Orbits: No acute abnormality.  Brain: No evidence of acute infarction, hemorrhage, hydrocephalus, or mass lesion/mass effect.  These results were called by telephone at the time of interpretation on 05/27/2014 at 10:32 pm to Dr. Tilden Fossa , who verbally acknowledged these results.  IMPRESSION: Negative head CT.   Electronically Signed   By: Marnee Spring M.D.   On: 05/27/2014 22:33   Ct Angio Neck W/cm &/or Wo/cm  05/28/2014   CLINICAL DATA:  LEFT hemi paresis 3 days ago, subsequent headache and dizziness. History of Amyotrophic lateral sclerosis.  EXAM: CT ANGIOGRAPHY HEAD AND NECK  TECHNIQUE: Multidetector CT imaging of the head and neck was performed using the standard protocol during bolus administration of intravenous contrast. Multiplanar CT image reconstructions and MIPs were obtained to evaluate the vascular anatomy. Carotid stenosis measurements (when applicable) are obtained utilizing NASCET criteria, using the distal internal carotid diameter as the denominator.  CONTRAST:  50mL OMNIPAQUE IOHEXOL 350 MG/ML SOLN  COMPARISON:  CT head and cervical spine May 27, 2014 and MRI brain December 20, 2012  FINDINGS: CT HEAD  The ventricles and sulci are normal for age. No intraparenchymal hemorrhage, mass effect nor midline shift. No acute large vascular territory infarcts. No abnormal intracranial enhancement.  No abnormal extra-axial fluid collections. Basal cisterns are patent.  No skull fracture.  The included ocular globes and orbital contents are non-suspicious. LEFT maxillary mucosal thickening. The mastoid air cells are well-aerated. Pneumatized petrous apices.  CTA NECK  Normal appearance of the thoracic arch, normal branch pattern. The origins of the innominate, left Common carotid artery and subclavian artery are widely patent.  Bilateral Common carotid arteries are widely patent, coursing in a straight line fashion. Normal appearance of the carotid bifurcations without hemodynamically significant stenosis by NASCET criteria. Normal appearance of the included internal carotid arteries. Streak artifact from dental amalgam limits assessment of vessels at the skull base.  Codominant vertebral artery's. Normal appearance of the vertebral arteries, which appear widely patent.  No hemodynamically significant stenosis by NASCET criteria. No dissection, no pseudoaneurysm. No abnormal luminal irregularity. No contrast extravasation.  Soft tissues are non acute; RIGHT parotid sialolith. No acute osseous process though bone windows have not been submitted. C5-6 ACDF with solid interbody fusion. Trace bilateral pleural effusions partially imaged.  CTA HEAD  Anterior circulation: Normal appearance of the cervical internal carotid arteries, petrous, cavernous and supra clinoid internal carotid arteries. Widely patent anterior communicating artery. Normal appearance of the anterior and  middle cerebral arteries.  Posterior circulation: Normal appearance of the vertebral arteries, vertebrobasilar junction and basilar artery, as well as main branch vessels. Fetal origin RIGHT posterior cerebral artery. Normal appearance of the posterior cerebral arteries.  No large vessel occlusion, hemodynamically significant stenosis, dissection, luminal irregularity, contrast extravasation or aneurysm within the anterior nor posterior circulation.  IMPRESSION: Negative noncontrast CT head for age.  Negative CTA head and neck.    Electronically Signed   By: Awilda Metro M.D.   On: 05/28/2014 22:46   Ct Cervical Spine Wo Contrast  05/27/2014   CLINICAL DATA:  Left-sided numbness and 7 p.m. Headache, dizziness, stable episode.  EXAM: CT CERVICAL SPINE WITHOUT CONTRAST  TECHNIQUE: Multidetector CT imaging of the cervical spine was performed without intravenous contrast. Multiplanar CT image reconstructions were also generated.  COMPARISON:  Head CT- earlier same day  FINDINGS: C1 to the superior endplate of T2 is imaged.  There is straightening of the expected cervical lordosis. No anterolisthesis or retrolisthesis. The dens is normally positioned and a lateral masses of C1. Normal atlantodental and atlantoaxial articulations. The bilateral facets are normally aligned.  Post C5-C6 ACDF and interbody fusion without evidence of hardware failure or loosening.  Cervical vertebral body heights are preserved. Prevertebral soft tissues are normal.  There is mild multilevel cervical spine DDD, worse at C4-C5 and C6-C7 with disc space height loss, endplate irregularity and sclerosis.  Scattered shotty bilateral cervical lymph nodes are individually not enlarged by size criteria. Normal noncontrast appearance of the thyroid gland. Limited visualization of lung apices is normal.  IMPRESSION: 1. No fracture or static subluxation of the cervical spine. 2. Post C5-C6 ACDF and interbody fusion without evidence of hardware failure or loosening. 3. 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   Electronically Signed   By: Simonne Come M.D.   On: 05/27/2014 23:40    Microbiology: No results found for this or any previous visit (from the past 240 hour(s)).   Labs: Basic Metabolic Panel:  Recent Labs Lab 05/27/14 2226 05/28/14 0758 05/29/14 0723 05/31/14 0630  NA 138 138 138 138  K 3.4* 3.5 3.3* 3.5  CL 104 105 109 104  CO2 24 23 19* 25  GLUCOSE 120* 86 137* 95  BUN 7 <5* 5* 8  CREATININE 1.08* 1.02* 1.07* 1.15*   CALCIUM 9.6 9.0 9.1 9.4   Liver Function Tests:  Recent Labs Lab 05/27/14 2226 05/28/14 0758  AST 21 19  ALT 21 17  ALKPHOS 67 55  BILITOT 0.4 0.6  PROT 8.1 6.5  ALBUMIN 4.4 3.8   No results for input(s): LIPASE, AMYLASE in the last 168 hours. No results for input(s): AMMONIA in the last 168 hours. CBC:  Recent Labs Lab 05/27/14 2226 05/28/14 0758 05/29/14 0723 05/31/14 0630  WBC 9.7 8.8 7.0 7.1  NEUTROABS 5.5 5.4  --   --   HGB 13.9 12.7 12.8 13.2  HCT 41.4 36.9 39.3 39.7  MCV 87.5 86.6 88.1 87.6  PLT 257 241 236 247   Cardiac Enzymes: No results for input(s): CKTOTAL, CKMB, CKMBINDEX, TROPONINI in the last 168 hours. BNP: BNP (last 3 results) No results for input(s): BNP in the last 8760 hours.  ProBNP (last 3 results) No results for input(s): PROBNP in the last 8760 hours.  CBG:  Recent Labs Lab 05/28/14 0751  GLUCAP 83       Signed:  Revecca Nachtigal  [facility] 05/31/2014, 2:17 PM

## 2014-05-31 NOTE — Progress Notes (Signed)
Chaplain Follow-up  Listened empathically: Listen in a manner that conveyed that I understood her cognitives and affective perceptions of the experiences.  However, provided strategy of meditative prayer technique to aid keeping out the noise of other distractions if she is placed in a shared room in the out patience rehab.   Follow-up if needed

## 2014-06-06 LAB — GI PATHOGEN PANEL BY PCR, STOOL

## 2014-11-08 ENCOUNTER — Ambulatory Visit: Payer: Self-pay | Admitting: Family Medicine

## 2015-05-26 ENCOUNTER — Emergency Department (HOSPITAL_BASED_OUTPATIENT_CLINIC_OR_DEPARTMENT_OTHER): Payer: Medicare Other

## 2015-05-26 ENCOUNTER — Encounter (HOSPITAL_BASED_OUTPATIENT_CLINIC_OR_DEPARTMENT_OTHER): Payer: Self-pay | Admitting: *Deleted

## 2015-05-26 ENCOUNTER — Emergency Department (HOSPITAL_BASED_OUTPATIENT_CLINIC_OR_DEPARTMENT_OTHER)
Admission: EM | Admit: 2015-05-26 | Discharge: 2015-05-26 | Disposition: A | Discharge status: Home / Self Care | Payer: Medicare Other | Attending: Emergency Medicine | Admitting: Emergency Medicine

## 2015-05-26 DIAGNOSIS — Z7982 Long term (current) use of aspirin: Secondary | ICD-10-CM | POA: Insufficient documentation

## 2015-05-26 DIAGNOSIS — F17210 Nicotine dependence, cigarettes, uncomplicated: Secondary | ICD-10-CM | POA: Diagnosis not present

## 2015-05-26 DIAGNOSIS — E785 Hyperlipidemia, unspecified: Secondary | ICD-10-CM | POA: Diagnosis not present

## 2015-05-26 DIAGNOSIS — M542 Cervicalgia: Secondary | ICD-10-CM

## 2015-05-26 DIAGNOSIS — M5412 Radiculopathy, cervical region: Secondary | ICD-10-CM | POA: Diagnosis not present

## 2015-05-26 DIAGNOSIS — Z79899 Other long term (current) drug therapy: Secondary | ICD-10-CM | POA: Insufficient documentation

## 2015-05-26 MED ORDER — OXYCODONE-ACETAMINOPHEN 5-325 MG PO TABS
1.0000 | ORAL_TABLET | ORAL | Status: DC | PRN
Start: 2015-05-26 — End: 2015-07-15

## 2015-05-26 MED ORDER — DIAZEPAM 5 MG/ML IJ SOLN
5.0000 mg | Freq: Once | INTRAMUSCULAR | Status: AC
  Administered 2015-05-26: 5 mg via INTRAVENOUS
  Filled 2015-05-26: qty 2

## 2015-05-26 MED ORDER — KETOROLAC TROMETHAMINE 30 MG/ML IJ SOLN
30.0000 mg | Freq: Once | INTRAMUSCULAR | Status: AC
  Administered 2015-05-26: 30 mg via INTRAVENOUS
  Filled 2015-05-26: qty 1

## 2015-05-26 MED ORDER — DEXAMETHASONE SODIUM PHOSPHATE 10 MG/ML IJ SOLN
10.0000 mg | Freq: Once | INTRAMUSCULAR | Status: AC
  Administered 2015-05-26: 10 mg via INTRAVENOUS
  Filled 2015-05-26: qty 1

## 2015-05-26 MED ORDER — DEXAMETHASONE 4 MG PO TABS: 8.0000 mg | ORAL_TABLET | Freq: Every day | ORAL | Status: AC

## 2015-05-26 MED ORDER — MORPHINE SULFATE (PF) 4 MG/ML IV SOLN
4.0000 mg | INTRAVENOUS | Status: DC | PRN
  Administered 2015-05-26 (×2): 4 mg via INTRAVENOUS
  Filled 2015-05-26 (×2): qty 1

## 2015-05-26 MED ORDER — MORPHINE SULFATE (PF) 4 MG/ML IV SOLN
4.0000 mg | Freq: Once | INTRAVENOUS | Status: AC
  Administered 2015-05-26: 4 mg via INTRAVENOUS
  Filled 2015-05-26: qty 1

## 2015-05-26 NOTE — ED Notes (Signed)
Patient ambulatory to the restroom and back with steady gait.

## 2015-05-26 NOTE — ED Notes (Signed)
Husband and wife upset due to the delay in pain medications. The patient reports that she is now nauseated because her pain is so bad. Patient laying on her side and shielding her eyes. MS updated and aware. No orders received at this time.

## 2015-05-26 NOTE — ED Provider Notes (Signed)
CSN: [id number]     Arrival date & time 05/26/15  1456 History   By signing my name below, I, Iona Beard, attest that this documentation has been prepared under the direction and in the presence of No att. providers found.   Electronically Signed: Iona Beard, ED Scribe. 05/28/2015. 7:34 AM   Chief Complaint  Patient presents with  . Neck Pain    The history is provided by the patient. No language interpreter was used.   HPI Comments: Lyndel Dancel is a 55 y.o. female with PMHx of ruptured disk, osteoarthritis, and degenerative joint disease, and PSHx of neck surgery who presents to the Emergency Department complaining of gradual onset, neck pain, ongoing for four days. Pt reports her pain radiates down her left arm. She also complains of left arm weakness and back pain. No other associated symptoms noted. Pt took one of her husbands vicodin PTA with no relief to symptoms. No other worsening or alleviating factors noted. Pt denies neck injury, numbness, tingling, fever, chills, urinary incontinence, bowel incontinence, or any other pertinent symptoms. Pt is allergic to dilaudid.   Past Medical History  Diagnosis Date  . 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 Primary lateral sclerosis (HCC)   . Hyperlipidemia   . G tube feedings [facility])    Past Surgical History  Procedure Laterality Date  . Abdominal hysterectomy    . Cholecystectomy    . Appendectomy    . Peg placement    . Neck surgery    . Esophagogastroduodenoscopy N/A 05/19/2012    Procedure: ESOPHAGOGASTRODUODENOSCOPY (EGD);  Surgeon: Theda Belfast, MD;  Location: [facility] ENDOSCOPY;  Service: Endoscopy;  Laterality: N/A;   Family History  Problem Relation Age of Onset  . Hypertension Brother    Social History  Substance Use Topics  . Smoking status: Current Every Day Smoker -- 0.50 packs/day    Types: Cigarettes  . Smokeless tobacco: None  . Alcohol Use: No   OB History    No data available     Review of Systems   Constitutional: Negative for fever and chills.  Musculoskeletal: Positive for back pain, arthralgias and neck pain.  Neurological: Positive for weakness.  All other systems reviewed and are negative.   Allergies  Dilaudid and Codeine  Home Medications   Prior to Admission medications   Medication Sig Start Date End Date Taking? Authorizing Provider  aspirin 81 MG tablet Take 81 mg by mouth daily.   Yes Historical Provider, MD  Multiple Vitamin (MULTI VITAMIN DAILY PO) Take by mouth.   Yes Historical Provider, MD  buPROPion (WELLBUTRIN XL) 300 MG 24 hr tablet Take 300 mg by mouth daily.    Historical Provider, MD  clonazePAM (KLONOPIN) 1 MG tablet Take 1 mg by mouth 2 (two) times daily.     Historical Provider, MD  dexamethasone (DECADRON) 4 MG tablet Take 2 tablets (8 mg total) by mouth daily. 05/26/15 05/29/15  Leta Baptist, MD  estradiol (ESTRACE) 1 MG tablet Take 1 mg by mouth daily.    Historical Provider, MD  ezetimibe (ZETIA) 10 MG tablet Take 10 mg by mouth daily.    Historical Provider, MD  gabapentin (NEURONTIN) 300 MG capsule Take 1 capsule (300 mg total) by mouth 3 (three) times daily. 05/31/14   Joseph Art, DO  oxyCODONE-acetaminophen (PERCOCET/ROXICET) 5-325 MG tablet Take 1-2 tablets by mouth every 4 (four) hours as needed for moderate pain or severe pain. 05/26/15   Leta Baptist, MD  SUMAtriptan (IMITREX) 100 MG tablet Take 1 tablet (100 mg total) by mouth every 2 (two) hours as needed for migraine or headache. May repeat in 2 hours if headache persists or recurs. 05/31/14   Joseph ArtJessica U Vann, DO  topiramate (TOPAMAX) 50 MG tablet Take 25 mg by mouth 2 (two) times daily.    Historical Provider, MD  traZODone (DESYREL) 100 MG tablet Take 100-200 mg by mouth at bedtime.     Historical Provider, MD   BP 119/52 mmHg  Pulse 75  Temp(Src) 97.9 F (36.6 C) (Oral)  Resp 20  Ht 5\' 3"  (1.6 m)  Wt 133 lb (60.328 kg)  BMI 23.57 kg/m2  SpO2 97% Physical Exam   Constitutional: She is oriented to person, place, and time. She appears well-developed and well-nourished. No distress.  HENT:  Head: Normocephalic and atraumatic.  Right Ear: External ear normal.  Left Ear: External ear normal.  Nose: Nose normal.  Mouth/Throat: Oropharynx is clear and moist. No oropharyngeal exudate.  Eyes: Conjunctivae and EOM are normal. Pupils are equal, round, and reactive to light.  Neck: Neck supple. No tracheal deviation present.  Cardiovascular: Normal rate, regular rhythm, normal heart sounds and intact distal pulses.   No murmur heard. Pulmonary/Chest: Effort normal. No respiratory distress. She has no wheezes. She has no rales.  Abdominal: Soft. She exhibits no distension. There is no tenderness.  Musculoskeletal: Normal range of motion. She exhibits tenderness. She exhibits no edema.       Cervical back: She exhibits tenderness.  Moderate cervical spine TTP. Slightly decreased grip strength on left side as compared to right. Pain with passive ROM of neck to right side. No decreased strength or sensation in BLE. No saddle anesthesia.   Neurological: She is alert and oriented to person, place, and time. No cranial nerve deficit or sensory deficit. She exhibits normal muscle tone.  Skin: Skin is warm and dry. No rash noted. She is not diaphoretic.  Psychiatric: She has a normal mood and affect. Her behavior is normal.  Vitals reviewed.   ED Course  Procedures (including critical care time) DIAGNOSTIC STUDIES: Oxygen Saturation is 97% on RA, normal by my interpretation.    COORDINATION OF CARE: 4:55 PM Discussed treatment plan with pt at bedside and pt agreed to plan.  Labs Review Labs Reviewed - No data to display  Imaging Review Dg Cervical Spine Complete  05/26/2015  CLINICAL DATA:  Chronic progressive neck pain without trauma. EXAM: CERVICAL SPINE - COMPLETE 4+ VIEW COMPARISON:  CT of 05/27/2014 FINDINGS: Status post anterior fixation at C5-6.  Prevertebral soft tissues are within normal limits. No acute hardware complication. Other vertebral body height and alignment maintained. Endplate osteophyte formation at C4-5 and C6-7 with loss of intervertebral disc height at C6-7. Lateral masses and odontoid process partially obscured on open-mouth view. No gross abnormality identified. IMPRESSION: Anterior fixation at C5-6, without acute hardware complication. Cervical spondylosis, without acute osseous finding. Electronically Signed   By: Jeronimo GreavesKyle  Talbot M.D.   On: 05/26/2015 17:47   I have personally reviewed and evaluated these images and lab results as part of my medical decision-making.   EKG Interpretation None      MDM  Patient seen and evaluated in stable condition.  Xray without acute finding.  Patient afebrile, non toxic in appearance.  Pain controlled with morphine, valium, decadron, single dose of toradol.  Patient discharged with percocet and decadron and instruction to follow up with neurosurgery outpatient.  STrict return precautions.  She  and her husband expressed understanding and agreement with plan of care. Final diagnoses:  Neck pain  1. Cervical radiculopathy   I personally performed the services described in this documentation, which was scribed in my presence. The recorded information has been reviewed and is accurate.      Leta Baptist, MD 05/28/15 [phone]

## 2015-05-26 NOTE — Discharge Instructions (Signed)
You were seen and evaluated today for your neck and arm pain. Likely this is secondary to the disc issues you were told you had previously. Please follow-up outpatient to have further evaluation likely with an MRI completed. You've been provided with the names of neurosurgeons you can arrange follow-up with. You can also that your primary care physician to help facilitate outpatient arrangements.  Cervical Radiculopathy Cervical radiculopathy happens when a nerve in the neck (cervical nerve) is pinched or bruised. This condition can develop because of an injury or as part of the normal aging process. Pressure on the cervical nerves can cause pain or numbness that runs from the neck all the way down into the arm and fingers. Usually, this condition gets better with rest. Treatment may be needed if the condition does not improve.  CAUSES This condition may be caused by:  Injury.  Slipped (herniated) disk.  Muscle tightness in the neck because of overuse.  Arthritis.  Breakdown or degeneration in the bones and joints of the spine (spondylosis) due to aging.  Bone spurs that may develop near the cervical nerves. SYMPTOMS Symptoms of this condition include:  Pain that runs from the neck to the arm and hand. The pain can be severe or irritating. It may be worse when the neck is moved.  Numbness or weakness in the affected arm and hand. DIAGNOSIS This condition may be diagnosed based on symptoms, medical history, and a physical exam. You may also have tests, including:  X-rays.  CT scan.  MRI.  Electromyogram (EMG).  Nerve conduction tests. TREATMENT In many cases, treatment is not needed for this condition. With rest, the condition usually gets better over time. If treatment is needed, options may include:  Wearing a soft neck collar for short periods of time.  Physical therapy to strengthen your neck muscles.  Medicines, such as NSAIDs, oral corticosteroids, or spinal  injections.  Surgery. This may be needed if other treatments do not help. Various types of surgery may be done depending on the cause of your problems. HOME CARE INSTRUCTIONS Managing Pain  Take over-the-counter and prescription medicines only as told by your health care provider.  If directed, apply ice to the affected area.  Put ice in a plastic bag.  Place a towel between your skin and the bag.  Leave the ice on for 20 minutes, 2-3 times per day.  If ice does not help, you can try using heat. Take a warm shower or warm bath, or use a heat pack as told by your health care provider.  Try a gentle neck and shoulder massage to help relieve symptoms. Activity  Rest as needed. Follow instructions from your health care provider about any restrictions on activities.  Do stretching and strengthening exercises as told by your health care provider or physical therapist. General Instructions  If you were given a soft collar, wear it as told by your health care provider.  Use a flat pillow when you sleep.  Keep all follow-up visits as told by your health care provider. This is important. SEEK MEDICAL CARE IF:  Your condition does not improve with treatment. SEEK IMMEDIATE MEDICAL CARE IF:  Your pain gets much worse and cannot be controlled with medicines.  You have weakness or numbness in your hand, arm, face, or leg.  You have a high fever.  You have a stiff, rigid neck.  You lose control of your bowels or your bladder (have incontinence).  You have trouble with walking, balance,  or speaking.   This information is not intended to replace advice given to you by your health care provider. Make sure you discuss any questions you have with your health care provider.   Document Released: 09/15/2000 Document Revised: 09/11/2014 Document Reviewed: 02/14/2014 Elsevier Interactive Patient Education [employer].

## 2015-05-26 NOTE — ED Notes (Signed)
Husband out asking for more pain medications. MD aware and patient updated

## 2015-05-26 NOTE — ED Notes (Signed)
Patient states that the pain medications were not effective. Wanting more.

## 2015-05-26 NOTE — ED Notes (Signed)
Went in to reevaluate the patient - the patient reports that she is feeling better, the valium is working and the morphine has made it better "but we are not quite there". The patient reports that a second dose should help. The patient is eating french fries with Ketchup "to help with my nausea". No further changes at this time

## 2015-05-26 NOTE — ED Notes (Signed)
Neck and back pain. States she has a ruptured disc.

## 2015-05-26 NOTE — ED Notes (Signed)
Husband out to the desk again and asked for more pain medications. Explained that the MD was aware and awaiting further orders. Patient is texting on phone in no distress

## 2015-05-27 ENCOUNTER — Telehealth: Payer: Self-pay | Admitting: Family Medicine

## 2015-05-27 NOTE — Telephone Encounter (Signed)
Relation to NW:GNFApt:self Call back number:[phone]   Reason for call:  Patient sent My Chart message requesting to transfer from Dr. Katrinka BlazingSmith to Dr. Abner GreenspanBlyth. Please advise  Appointment For: [name],Kayla ([id number])    Visit Type: MYCHART OFFICE VISIT ([id number])      07/03/2015  1:15 PM 15 mins. Kayla CanaryStacey A Blyth, MD    [facility]      Patient Comments:   Office Visit   I need to change primary Docter and would like to Dr.    Rogelia RohrerBlythe. Would you please let me know if this time    works?

## 2015-05-28 NOTE — Telephone Encounter (Signed)
LVM advising patient of message below °

## 2015-05-28 NOTE — Telephone Encounter (Signed)
OK to transfer, I believe I see family. Make sure to let me know if they need an semi urgent appt so we can work it out.

## 2015-06-18 NOTE — Progress Notes (Signed)
Pt made aware to stop taking Aspirin,vitamin, fish oil and herbal medications. Do not take any NSAIDs ie: Ibuprofen, Advil, Naproxen, BC and Goody Powder or any medication containing Aspirin.Pt verbalized understanding of all pre-op instructions.

## 2015-06-19 ENCOUNTER — Inpatient Hospital Stay (HOSPITAL_COMMUNITY): Payer: Medicare Other

## 2015-06-19 ENCOUNTER — Encounter (HOSPITAL_COMMUNITY)
Admission: RE | Disposition: A | Discharge status: Home / Self Care | Payer: Self-pay | Source: Ambulatory Visit | Attending: Neurosurgery

## 2015-06-19 ENCOUNTER — Observation Stay (HOSPITAL_COMMUNITY)
Admission: RE | Admit: 2015-06-19 | Discharge: 2015-06-19 | Disposition: A | Discharge status: Home / Self Care | Payer: Medicare Other | Source: Ambulatory Visit | Attending: Neurosurgery | Admitting: Neurosurgery

## 2015-06-19 ENCOUNTER — Inpatient Hospital Stay (HOSPITAL_COMMUNITY): Payer: Medicare Other | Admitting: Certified Registered"

## 2015-06-19 ENCOUNTER — Encounter (HOSPITAL_COMMUNITY): Payer: Self-pay | Admitting: Surgery

## 2015-06-19 ENCOUNTER — Other Ambulatory Visit: Payer: Self-pay | Admitting: Neurosurgery

## 2015-06-19 DIAGNOSIS — F329 Major depressive disorder, single episode, unspecified: Secondary | ICD-10-CM | POA: Insufficient documentation

## 2015-06-19 DIAGNOSIS — M4722 Other spondylosis with radiculopathy, cervical region: Secondary | ICD-10-CM | POA: Insufficient documentation

## 2015-06-19 DIAGNOSIS — M50221 Other cervical disc displacement at C4-C5 level: Secondary | ICD-10-CM | POA: Diagnosis not present

## 2015-06-19 DIAGNOSIS — G43909 Migraine, unspecified, not intractable, without status migrainosus: Secondary | ICD-10-CM | POA: Diagnosis not present

## 2015-06-19 DIAGNOSIS — Z7951 Long term (current) use of inhaled steroids: Secondary | ICD-10-CM | POA: Diagnosis not present

## 2015-06-19 DIAGNOSIS — Z419 Encounter for procedure for purposes other than remedying health state, unspecified: Secondary | ICD-10-CM

## 2015-06-19 DIAGNOSIS — Z7982 Long term (current) use of aspirin: Secondary | ICD-10-CM | POA: Insufficient documentation

## 2015-06-19 DIAGNOSIS — Z79899 Other long term (current) drug therapy: Secondary | ICD-10-CM | POA: Insufficient documentation

## 2015-06-19 DIAGNOSIS — M50121 Cervical disc disorder at C4-C5 level with radiculopathy: Secondary | ICD-10-CM | POA: Diagnosis not present

## 2015-06-19 DIAGNOSIS — F17210 Nicotine dependence, cigarettes, uncomplicated: Secondary | ICD-10-CM | POA: Diagnosis not present

## 2015-06-19 DIAGNOSIS — I129 Hypertensive chronic kidney disease with stage 1 through stage 4 chronic kidney disease, or unspecified chronic kidney disease: Secondary | ICD-10-CM | POA: Insufficient documentation

## 2015-06-19 DIAGNOSIS — Z79890 Hormone replacement therapy (postmenopausal): Secondary | ICD-10-CM | POA: Insufficient documentation

## 2015-06-19 DIAGNOSIS — N183 Chronic kidney disease, stage 3 (moderate): Secondary | ICD-10-CM | POA: Diagnosis not present

## 2015-06-19 DIAGNOSIS — M5020 Other cervical disc displacement, unspecified cervical region: Secondary | ICD-10-CM | POA: Diagnosis present

## 2015-06-19 DIAGNOSIS — Z981 Arthrodesis status: Secondary | ICD-10-CM | POA: Insufficient documentation

## 2015-06-19 HISTORY — DX: Spondylosis, unspecified: M47.9

## 2015-06-19 HISTORY — DX: Adverse effect of unspecified anesthetic, initial encounter: T41.45XA

## 2015-06-19 HISTORY — DX: Spondylosis without myelopathy or radiculopathy, thoracolumbar region: M47.815

## 2015-06-19 HISTORY — DX: Chronic kidney disease, unspecified: N18.9

## 2015-06-19 HISTORY — DX: Anxiety disorder, unspecified: F41.9

## 2015-06-19 HISTORY — DX: Other complications of anesthesia, initial encounter: T88.59XA

## 2015-06-19 HISTORY — DX: Other cervical disc degeneration, unspecified cervical region: M50.30

## 2015-06-19 HISTORY — DX: Drug induced constipation: K59.03

## 2015-06-19 HISTORY — DX: Migraine, unspecified, not intractable, without status migrainosus: G43.909

## 2015-06-19 HISTORY — DX: Reserved for inherently not codable concepts without codable children: IMO0001

## 2015-06-19 HISTORY — PX: ANTERIOR CERVICAL DECOMP/DISCECTOMY FUSION: SHX1161

## 2015-06-19 LAB — CBC
HCT: 39.9 % (ref 36.0–46.0)
HEMOGLOBIN: 13 g/dL (ref 12.0–15.0)
MCH: 29.2 pg (ref 26.0–34.0)
MCHC: 32.6 g/dL (ref 30.0–36.0)
MCV: 89.7 fL (ref 78.0–100.0)
PLATELETS: 251 10*3/uL (ref 150–400)
RBC: 4.45 MIL/uL (ref 3.87–5.11)
RDW: 12.4 % (ref 11.5–15.5)
WBC: 5.9 10*3/uL (ref 4.0–10.5)

## 2015-06-19 LAB — BASIC METABOLIC PANEL
Anion gap: 12 (ref 5–15)
BUN: 8 mg/dL (ref 6–20)
CALCIUM: 9.2 mg/dL (ref 8.9–10.3)
CHLORIDE: 104 mmol/L (ref 101–111)
CO2: 22 mmol/L (ref 22–32)
CREATININE: 0.97 mg/dL (ref 0.44–1.00)
GFR calc non Af Amer: >60 mL/min (ref >60)
Glucose, Bld: 101 mg/dL — ABNORMAL HIGH (ref 65–99)
Potassium: 4 mmol/L (ref 3.5–5.1)
SODIUM: 138 mmol/L (ref 135–145)

## 2015-06-19 LAB — SURGICAL PCR SCREEN
MRSA, PCR: NEGATIVE
Staphylococcus aureus: NEGATIVE

## 2015-06-19 SURGERY — ANTERIOR CERVICAL DECOMPRESSION/DISCECTOMY FUSION 1 LEVEL/HARDWARE REMOVAL
Anesthesia: General | Site: Spine Cervical

## 2015-06-19 MED ORDER — FENTANYL CITRATE (PF) 250 MCG/5ML IJ SOLN
INTRAMUSCULAR | Status: DC | PRN
  Administered 2015-06-19: 150 ug via INTRAVENOUS
  Administered 2015-06-19 (×2): 50 ug via INTRAVENOUS

## 2015-06-19 MED ORDER — SODIUM CHLORIDE 0.9 % IV SOLN
INTRAVENOUS | Status: DC
  Administered 2015-06-19: 10:00:00 via INTRAVENOUS

## 2015-06-19 MED ORDER — CYCLOBENZAPRINE HCL 10 MG PO TABS
10.0000 mg | ORAL_TABLET | Freq: Three times a day (TID) | ORAL | Status: DC | PRN
  Administered 2015-06-19: 10 mg via ORAL
  Filled 2015-06-19: qty 1

## 2015-06-19 MED ORDER — HYDROCODONE-ACETAMINOPHEN 5-325 MG PO TABS: 1.0000 | ORAL_TABLET | ORAL | Status: DC | PRN

## 2015-06-19 MED ORDER — CYCLOBENZAPRINE HCL 10 MG PO TABS: 5.0000 mg | ORAL_TABLET | Freq: Three times a day (TID) | ORAL | Status: DC | PRN

## 2015-06-19 MED ORDER — CEFAZOLIN SODIUM-DEXTROSE 2-3 GM-% IV SOLR
INTRAVENOUS | Status: DC | PRN
  Administered 2015-06-19: 2 g via INTRAVENOUS

## 2015-06-19 MED ORDER — BUPIVACAINE HCL (PF) 0.5 % IJ SOLN
INTRAMUSCULAR | Status: DC | PRN
  Administered 2015-06-19: 5 mL

## 2015-06-19 MED ORDER — MAGNESIUM HYDROXIDE 400 MG/5ML PO SUSP: 30.0000 mL | Freq: Every day | ORAL | Status: DC | PRN

## 2015-06-19 MED ORDER — BUPROPION HCL ER (XL) 300 MG PO TB24: 300.0000 mg | ORAL_TABLET | Freq: Every day | ORAL | Status: DC

## 2015-06-19 MED ORDER — PHENOL 1.4 % MT LIQD: 1.0000 | OROMUCOSAL | Status: DC | PRN

## 2015-06-19 MED ORDER — KETOROLAC TROMETHAMINE 30 MG/ML IJ SOLN
30.0000 mg | Freq: Once | INTRAMUSCULAR | Status: AC
  Administered 2015-06-19: 30 mg via INTRAVENOUS

## 2015-06-19 MED ORDER — GELATIN ABSORBABLE MT POWD
OROMUCOSAL | Status: DC | PRN
  Administered 2015-06-19: 10:00:00 via TOPICAL

## 2015-06-19 MED ORDER — FENTANYL CITRATE (PF) 100 MCG/2ML IJ SOLN
INTRAMUSCULAR | Status: AC
  Administered 2015-06-19: 50 ug via INTRAVENOUS
  Filled 2015-06-19: qty 2

## 2015-06-19 MED ORDER — ONDANSETRON HCL 4 MG/2ML IJ SOLN
4.0000 mg | Freq: Four times a day (QID) | INTRAMUSCULAR | Status: DC | PRN
Start: 2015-06-19 — End: 2015-06-19
  Administered 2015-06-19: 4 mg via INTRAVENOUS

## 2015-06-19 MED ORDER — SODIUM CHLORIDE 0.9 % IV SOLN: 250.0000 mL | INTRAVENOUS | Status: DC

## 2015-06-19 MED ORDER — MIDAZOLAM HCL 5 MG/5ML IJ SOLN
INTRAMUSCULAR | Status: DC | PRN
  Administered 2015-06-19: 2 mg via INTRAVENOUS

## 2015-06-19 MED ORDER — LIDOCAINE 2% (20 MG/ML) 5 ML SYRINGE
INTRAMUSCULAR | Status: AC
  Filled 2015-06-19: qty 5

## 2015-06-19 MED ORDER — BISACODYL 10 MG RE SUPP: 10.0000 mg | Freq: Every day | RECTAL | Status: DC | PRN

## 2015-06-19 MED ORDER — BACITRACIN 50000 UNITS IM SOLR: INTRAMUSCULAR | Status: DC | PRN

## 2015-06-19 MED ORDER — KCL IN DEXTROSE-NACL 20-5-0.45 MEQ/L-%-% IV SOLN: INTRAVENOUS | Status: DC

## 2015-06-19 MED ORDER — SODIUM CHLORIDE 0.9% FLUSH: 3.0000 mL | INTRAVENOUS | Status: DC | PRN

## 2015-06-19 MED ORDER — SUGAMMADEX SODIUM 200 MG/2ML IV SOLN
INTRAVENOUS | Status: AC
  Filled 2015-06-19: qty 2

## 2015-06-19 MED ORDER — FLUTICASONE PROPIONATE 50 MCG/ACT NA SUSP
2.0000 | Freq: Two times a day (BID) | NASAL | Status: DC
  Filled 2015-06-19: qty 16

## 2015-06-19 MED ORDER — HYDROXYZINE HCL 50 MG/ML IM SOLN: 50.0000 mg | INTRAMUSCULAR | Status: DC | PRN

## 2015-06-19 MED ORDER — KETOROLAC TROMETHAMINE 30 MG/ML IJ SOLN
INTRAMUSCULAR | Status: AC
  Filled 2015-06-19: qty 1

## 2015-06-19 MED ORDER — MORPHINE SULFATE (PF) 4 MG/ML IV SOLN: 4.0000 mg | INTRAVENOUS | Status: DC | PRN

## 2015-06-19 MED ORDER — KETOROLAC TROMETHAMINE 30 MG/ML IJ SOLN
30.0000 mg | Freq: Four times a day (QID) | INTRAMUSCULAR | Status: DC
  Administered 2015-06-19: 30 mg via INTRAVENOUS
  Filled 2015-06-19: qty 1

## 2015-06-19 MED ORDER — MIDAZOLAM HCL 2 MG/2ML IJ SOLN
INTRAMUSCULAR | Status: AC
  Filled 2015-06-19: qty 2

## 2015-06-19 MED ORDER — ROCURONIUM BROMIDE 50 MG/5ML IV SOLN
INTRAVENOUS | Status: AC
  Filled 2015-06-19: qty 1

## 2015-06-19 MED ORDER — 0.9 % SODIUM CHLORIDE (POUR BTL) OPTIME
TOPICAL | Status: DC | PRN
  Administered 2015-06-19: 1000 mL

## 2015-06-19 MED ORDER — ACETAMINOPHEN 325 MG PO TABS: 650.0000 mg | ORAL_TABLET | ORAL | Status: DC | PRN

## 2015-06-19 MED ORDER — TRAZODONE HCL 100 MG PO TABS
100.0000 mg | ORAL_TABLET | Freq: Every day | ORAL | Status: DC
  Filled 2015-06-19: qty 1

## 2015-06-19 MED ORDER — ALUM & MAG HYDROXIDE-SIMETH 200-200-20 MG/5ML PO SUSP: 30.0000 mL | Freq: Four times a day (QID) | ORAL | Status: DC | PRN

## 2015-06-19 MED ORDER — SODIUM CHLORIDE 0.9% FLUSH: 3.0000 mL | Freq: Two times a day (BID) | INTRAVENOUS | Status: DC

## 2015-06-19 MED ORDER — THROMBIN 5000 UNITS EX SOLR
CUTANEOUS | Status: DC | PRN
  Administered 2015-06-19 (×2): 5000 [IU] via TOPICAL

## 2015-06-19 MED ORDER — ACETAMINOPHEN 650 MG RE SUPP: 650.0000 mg | RECTAL | Status: DC | PRN

## 2015-06-19 MED ORDER — LIDOCAINE-EPINEPHRINE 1 %-1:100000 IJ SOLN
INTRAMUSCULAR | Status: DC | PRN
  Administered 2015-06-19: 5 mL

## 2015-06-19 MED ORDER — HYDROXYZINE HCL 25 MG PO TABS: 50.0000 mg | ORAL_TABLET | ORAL | Status: DC | PRN

## 2015-06-19 MED ORDER — FENTANYL CITRATE (PF) 250 MCG/5ML IJ SOLN
INTRAMUSCULAR | Status: AC
  Filled 2015-06-19: qty 5

## 2015-06-19 MED ORDER — ESTRADIOL 1 MG PO TABS: 1.0000 mg | ORAL_TABLET | Freq: Every day | ORAL | Status: DC

## 2015-06-19 MED ORDER — ONDANSETRON HCL 4 MG/2ML IJ SOLN
INTRAMUSCULAR | Status: AC
  Filled 2015-06-19: qty 2

## 2015-06-19 MED ORDER — FENTANYL CITRATE (PF) 100 MCG/2ML IJ SOLN
25.0000 ug | INTRAMUSCULAR | Status: DC | PRN
  Administered 2015-06-19: 50 ug via INTRAVENOUS
  Administered 2015-06-19: 25 ug via INTRAVENOUS

## 2015-06-19 MED ORDER — ONDANSETRON HCL 4 MG PO TABS: 4.0000 mg | ORAL_TABLET | Freq: Four times a day (QID) | ORAL | Status: DC | PRN

## 2015-06-19 MED ORDER — ZOLPIDEM TARTRATE 5 MG PO TABS: 5.0000 mg | ORAL_TABLET | Freq: Every evening | ORAL | Status: DC | PRN

## 2015-06-19 MED ORDER — CLONAZEPAM 0.5 MG PO TABS: 1.0000 mg | ORAL_TABLET | Freq: Two times a day (BID) | ORAL | Status: DC

## 2015-06-19 MED ORDER — SODIUM CHLORIDE 0.9 % IR SOLN
Status: DC | PRN
  Administered 2015-06-19: 500 mL

## 2015-06-19 MED ORDER — MENTHOL 3 MG MT LOZG: 1.0000 | LOZENGE | OROMUCOSAL | Status: DC | PRN

## 2015-06-19 MED ORDER — PROPOFOL 10 MG/ML IV BOLUS
INTRAVENOUS | Status: DC | PRN
  Administered 2015-06-19: 100 mg via INTRAVENOUS

## 2015-06-19 MED ORDER — OXYCODONE-ACETAMINOPHEN 5-325 MG PO TABS: 1.0000 | ORAL_TABLET | ORAL | Status: DC | PRN

## 2015-06-19 MED ORDER — LIDOCAINE 2% (20 MG/ML) 5 ML SYRINGE
INTRAMUSCULAR | Status: DC | PRN
  Administered 2015-06-19: 100 mg via INTRAVENOUS

## 2015-06-19 MED ORDER — PROPOFOL 10 MG/ML IV BOLUS
INTRAVENOUS | Status: AC
  Filled 2015-06-19: qty 20

## 2015-06-19 MED ORDER — ROCURONIUM BROMIDE 100 MG/10ML IV SOLN
INTRAVENOUS | Status: DC | PRN
  Administered 2015-06-19: 35 mg via INTRAVENOUS

## 2015-06-19 MED ORDER — EPHEDRINE SULFATE 50 MG/ML IJ SOLN
INTRAMUSCULAR | Status: DC | PRN
  Administered 2015-06-19: 10 mg via INTRAVENOUS

## 2015-06-19 MED ORDER — PHENYLEPHRINE 40 MCG/ML (10ML) SYRINGE FOR IV PUSH (FOR BLOOD PRESSURE SUPPORT)
PREFILLED_SYRINGE | INTRAVENOUS | Status: AC
  Filled 2015-06-19: qty 10

## 2015-06-19 MED ORDER — ONDANSETRON HCL 4 MG/2ML IJ SOLN
INTRAMUSCULAR | Status: DC | PRN
  Administered 2015-06-19: 4 mg via INTRAVENOUS

## 2015-06-19 MED ORDER — HEMOSTATIC AGENTS (NO CHARGE) OPTIME
TOPICAL | Status: DC | PRN
  Administered 2015-06-19: 1 via TOPICAL

## 2015-06-19 MED ORDER — SUGAMMADEX SODIUM 200 MG/2ML IV SOLN
INTRAVENOUS | Status: DC | PRN
  Administered 2015-06-19: 200 mg via INTRAVENOUS

## 2015-06-19 MED ORDER — ACETAMINOPHEN 10 MG/ML IV SOLN
INTRAVENOUS | Status: AC
  Administered 2015-06-19: 1000 mg via INTRAVENOUS
  Filled 2015-06-19: qty 100

## 2015-06-19 MED ORDER — EPHEDRINE 5 MG/ML INJ
INTRAVENOUS | Status: AC
  Filled 2015-06-19: qty 10

## 2015-06-19 MED ORDER — PHENYLEPHRINE HCL 10 MG/ML IJ SOLN
INTRAMUSCULAR | Status: DC | PRN
  Administered 2015-06-19 (×3): 80 ug via INTRAVENOUS

## 2015-06-19 MED ORDER — MUPIROCIN 2 % EX OINT
1.0000 "application " | TOPICAL_OINTMENT | Freq: Once | CUTANEOUS | Status: AC
  Administered 2015-06-19: 1 via TOPICAL
  Filled 2015-06-19: qty 22

## 2015-06-19 SURGICAL SUPPLY — 55 items
ALLOGRAFT 7X14X11 (Bone Implant) ×2 IMPLANT
BAG DECANTER FOR FLEXI CONT (MISCELLANEOUS) ×2 IMPLANT
BIT DRILL NEURO 2X3.1 SFT TUCH (MISCELLANEOUS) ×1 IMPLANT
BLADE ULTRA TIP 2M (BLADE) ×2 IMPLANT
BRUSH SCRUB EZ PLAIN DRY (MISCELLANEOUS) ×2 IMPLANT
CANISTER SUCT 3000ML PPV (MISCELLANEOUS) ×2 IMPLANT
COVER MAYO STAND STRL (DRAPES) ×2 IMPLANT
DECANTER SPIKE VIAL GLASS SM (MISCELLANEOUS) ×2 IMPLANT
DERMABOND ADHESIVE PROPEN (GAUZE/BANDAGES/DRESSINGS) ×1
DERMABOND ADVANCED (GAUZE/BANDAGES/DRESSINGS) ×1
DERMABOND ADVANCED .7 DNX12 (GAUZE/BANDAGES/DRESSINGS) ×1 IMPLANT
DERMABOND ADVANCED .7 DNX6 (GAUZE/BANDAGES/DRESSINGS) ×1 IMPLANT
DRAPE LAPAROTOMY 100X72 PEDS (DRAPES) ×2 IMPLANT
DRAPE MICROSCOPE LEICA (MISCELLANEOUS) ×2 IMPLANT
DRAPE POUCH INSTRU U-SHP 10X18 (DRAPES) ×2 IMPLANT
DRAPE PROXIMA HALF (DRAPES) ×2 IMPLANT
DRILL NEURO 2X3.1 SOFT TOUCH (MISCELLANEOUS) ×2
ELECT COATED BLADE 2.86 ST (ELECTRODE) ×2 IMPLANT
ELECT REM PT RETURN 9FT ADLT (ELECTROSURGICAL) ×2
ELECTRODE REM PT RTRN 9FT ADLT (ELECTROSURGICAL) ×1 IMPLANT
GLOVE BIOGEL PI IND STRL 7.5 (GLOVE) ×1 IMPLANT
GLOVE BIOGEL PI IND STRL 8 (GLOVE) ×2 IMPLANT
GLOVE BIOGEL PI INDICATOR 7.5 (GLOVE) ×1
GLOVE BIOGEL PI INDICATOR 8 (GLOVE) ×2
GLOVE ECLIPSE 7.5 STRL STRAW (GLOVE) ×6 IMPLANT
GLOVE EXAM NITRILE LRG STRL (GLOVE) IMPLANT
GLOVE EXAM NITRILE MD LF STRL (GLOVE) IMPLANT
GLOVE EXAM NITRILE XL STR (GLOVE) IMPLANT
GLOVE EXAM NITRILE XS STR PU (GLOVE) IMPLANT
GLOVE SS BIOGEL STRL SZ 7 (GLOVE) ×1 IMPLANT
GLOVE SUPERSENSE BIOGEL SZ 7 (GLOVE) ×1
GOWN STRL REUS W/ TWL LRG LVL3 (GOWN DISPOSABLE) IMPLANT
GOWN STRL REUS W/ TWL XL LVL3 (GOWN DISPOSABLE) IMPLANT
GOWN STRL REUS W/TWL 2XL LVL3 (GOWN DISPOSABLE) IMPLANT
GOWN STRL REUS W/TWL LRG LVL3 (GOWN DISPOSABLE)
GOWN STRL REUS W/TWL XL LVL3 (GOWN DISPOSABLE)
HALTER HD/CHIN CERV TRACTION D (MISCELLANEOUS) ×2 IMPLANT
HEMOSTAT POWDER KIT SURGIFOAM (HEMOSTASIS) ×2 IMPLANT
KIT BASIN OR (CUSTOM PROCEDURE TRAY) ×2 IMPLANT
KIT ROOM TURNOVER OR (KITS) ×2 IMPLANT
NEEDLE HYPO 25X1 1.5 SAFETY (NEEDLE) ×2 IMPLANT
NEEDLE SPNL 22GX3.5 QUINCKE BK (NEEDLE) ×2 IMPLANT
NS IRRIG 1000ML POUR BTL (IV SOLUTION) ×2 IMPLANT
PACK LAMINECTOMY NEURO (CUSTOM PROCEDURE TRAY) ×2 IMPLANT
PAD ARMBOARD 7.5X6 YLW CONV (MISCELLANEOUS) ×6 IMPLANT
PLATE AVIATOR ASSY 1LVL SZ 16 (Plate) ×2 IMPLANT
RUBBERBAND STERILE (MISCELLANEOUS) ×4 IMPLANT
SCREW AVIATOR VAR SELFTAP 4X12 (Screw) ×8 IMPLANT
SPONGE INTESTINAL PEANUT (DISPOSABLE) ×2 IMPLANT
STAPLER SKIN PROX WIDE 3.9 (STAPLE) ×2 IMPLANT
SUT VIC AB 2-0 CP2 18 (SUTURE) ×2 IMPLANT
SUT VIC AB 3-0 SH 8-18 (SUTURE) ×4 IMPLANT
TOWEL OR 17X24 6PK STRL BLUE (TOWEL DISPOSABLE) ×2 IMPLANT
TOWEL OR 17X26 10 PK STRL BLUE (TOWEL DISPOSABLE) ×2 IMPLANT
WATER STERILE IRR 1000ML POUR (IV SOLUTION) ×2 IMPLANT

## 2015-06-19 NOTE — H&P (Signed)
Subjective: Patient is a 55 y.o. right handed white female who is admitted for treatment of a C5-6 cervical disc patient with advanced spondylosis and degenerative disc disease.  Patient has increasing posterior neck pain, with left cervical radicular pain rating to the left shoulder, arm, forearm, and into the hand. There is numbness in the left thumb. He has not had any significant radicular right upper extremity symptoms. Patient has had a previous C5-6 ACDF by Dr. Reino KentSaul Schwartz with a Codman plate. X-rays and MRI scan revealed advanced spondylosis and degenerative disease with associated spondylitic disc herniation. Patient is admitted now for a C4-5 anterior cervical decompression arthrodesis with structural allograft and cervical plating.   Patient Active Problem List   Diagnosis Date Noted  . Adjustment disorder with mixed anxiety and depressed mood 05/30/2014  . Left-sided weakness 05/28/2014  . Headache 05/28/2014  . Weakness 05/27/2014  . Closed fracture of right distal radius 02/24/2013  . 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 Hypotension 05/19/2012  . Abdominal pain 05/19/2012  . Doreatha MartinLou Gehrig's disease (HCC) 05/19/2012  . Abdominal wall cellulitis 05/19/2012   Past Medical History  Diagnosis Date  . Hilda BladesLou Gehrig disease [facility](HCC)   . Primary lateral sclerosis (HCC)   . Hyperlipidemia   . G tube feedings [facility](HCC)     Past Surgical History  Procedure Laterality Date  . Abdominal hysterectomy    . Cholecystectomy    . Appendectomy    . Peg placement    . Neck surgery    . Esophagogastroduodenoscopy N/A 05/19/2012    Procedure: ESOPHAGOGASTRODUODENOSCOPY (EGD);  Surgeon: Theda BelfastPatrick D Hung, MD;  [facility];  Service: Endoscopy;  Laterality: N/A;    No prescriptions prior to admission   Allergies  Allergen Reactions  . Dilaudid [Hydromorphone Hcl] Itching  . Codeine Nausea And Vomiting and Rash    Patient hasn't had it Codeine She doesn't think she has an existing allergy     Social History  Substance Use Topics  . Smoking status: Current Every Day Smoker -- 0.50 packs/day    Types: Cigarettes  . Smokeless tobacco: Not on file  . Alcohol Use: No    Family History  Problem Relation Age of Onset  . Hypertension Brother      Review of Systems A comprehensive review of systems was negative.  Objective: Vital signs in last 24 hours:    EXAM: She is a well-developed well-nourished white female in no acute distress. Lungs are clear to auscultation , the patient has symmetrical respiratory excursion. Heart has a regular rate and rhythm normal S1 and S2 no murmur.   Abdomen is soft nontender nondistended bowel sounds are present. Extremity examination shows no clubbing cyanosis or edema. Motor examination shows a tendency to give away on muscle testing, but the strength seems to be 5 over 5 in the upper extremities including the deltoid biceps triceps and intrinsics and grip. Sensation is intact to pinprick throughout the digits of the upper extremities. Reflexes are 2 in the biceps, brachial radialis, and triceps. They're symmetrical bilaterally. Quadriceps are 3-4 bilaterally. Left gastrocnemius is 2, right gastrocnemius is 1. Toes are downgoing bilaterally. There is mild unsteadiness of gait and stance.   Data Review:CBC    Component Value Date/Time   WBC 7.1 05/31/2014 0630   RBC 4.53 05/31/2014 0630   HGB 13.2 05/31/2014 0630   HCT 39.7 05/31/2014 0630   PLT 247 05/31/2014 0630   MCV 87.6 05/31/2014 0630   MCH 29.1 05/31/2014 0630  MCHC 33.2 05/31/2014 0630   RDW 13.0 05/31/2014 0630   LYMPHSABS 2.6 05/28/2014 0758   MONOABS 0.6 05/28/2014 0758   EOSABS 0.2 05/28/2014 0758   BASOSABS 0.0 05/28/2014 0758                          BMET    Component Value Date/Time   NA 138 05/31/2014 0630   K 3.5 05/31/2014 0630   CL 104 05/31/2014 0630   CO2 25 05/31/2014 0630   GLUCOSE 95 05/31/2014 0630   BUN 8 05/31/2014 0630   CREATININE 1.15* 05/31/2014  0630   CALCIUM 9.4 05/31/2014 0630   GFRNONAA 53* 05/31/2014 0630   GFRAA >60 05/31/2014 0630     Assessment/Plan: Patient with neck pain and left cervical radicular pain who has advanced degeneration at the C4-5 level, and a previous C5-6 ACDF, who is admitted now for a C4-5 ACDF. We will remove the existing anterior cervical plate.  I've discussed with the patient the nature of his condition, the nature the surgical procedure, the typical length of surgery, hospital stay, and overall recuperation. We discussed limitations postoperatively. I discussed risks of surgery including risks of infection, bleeding, possibly need for transfusion, the risk of nerve root dysfunction with pain, weakness, numbness, or paresthesias, the risk of spinal cord dysfunction with paralysis of all 4 limbs and quadriplegia, and the risk of dural tear and CSF leakage and possible need for further surgery, the risk of esophageal dysfunction causing dysphagia and the risk of laryngeal dysfunction causing hoarseness of the voice, the risk of failure of the arthrodesis and the possible need for further surgery, and the risk of anesthetic complications including myocardial infarction, stroke, pneumonia, and death. We also discussed the need for postoperative immobilization in a cervical collar. Understanding all this the patient does wish to proceed with surgery and is admitted for such.    Hewitt Shorts, MD 06/19/2015 7:35 AM

## 2015-06-19 NOTE — Op Note (Signed)
06/19/2015  12:07 PM  PATIENT:  Kayla [name]  55 y.o. 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  ANESTHESIA:   general  EBL:   50 cc  BLOOD ADMINISTERED:none  COUNT: Correct per nursing staff  DICTATION: Patient was brought to the operating room placed under general endotracheal anesthesia. Patient was placed in 10 pounds of halter traction. The neck was prepped with Betadine soap and solution and draped in a sterile fashion. A horizontal incision was made on the left side of the neck. The line of the incision was infiltrated with local anesthetic with epinephrine. Dissection was carried down thru the subcutaneous tissue and platysma, bipolar cautery was used to maintain hemostasis. Dissection was then carried down thru an avascular plane leaving the sternocleidomastoid carotid artery and jugular vein laterally and the trachea and esophagus medially. The ventral aspect of the vertebral column was identified and we visualized the existing C5-6 anterior cervical plate. Scar tissue over it was carefully removed, and then we unlocked the cams, and removed all 4 screws. We then removed the plate. The screw holes were filled with Surgifoam, and good hemostasis established. We then identified the C4-5 level. The annulus was incised and the disc space entered. Discectomy was performed with micro-curettes and pituitary rongeurs. The operating  microscope was draped and brought into the field provided additional magnification illumination and visualization. Discectomy was continued posteriorly thru the disc space and then the cartilaginous endplate was removed using micro-curettes along with the high-speed drill. Posterior osteophytic overgrowth was removed using the high-speed drill along with a 2 mm thin footplated Kerrison punch. Posterior longitudinal ligament along with disc herniation was carefully removed, decompressing the spinal canal and thecal sac. We then continued to remove osteophytic overgrowth and disc material decompressing the neural foramina and exiting nerve roots bilaterally. We did find a free fragment soft disc herniation in the left C4-5 neural foramen, corresponding to her left cervical radiculopathy. Once the decompression was completed hemostasis was established with the use of Gelfoam with thrombin and bipolar cautery. The Gelfoam was removed the wound irrigated, a thin layer Surgifoam was applied, and hemostasis confirmed. We then measured the height of the intravertebral disc space and selected a 7 millimeter in height structural allograft. It was hydrated and saline solution and then gently positioned in the intravertebral disc space and countersunk. We then selected a 16 millimeter in height Aviator cervical plate. It was positioned over the fusion construct and secured to the vertebra with 4 x 12 mm self-tapping screws at each level. Each screw hole was started with the high-speed drill and then the screws placed once all the screws were placed, the locking system was secured. The wound was irrigated with bacitracin solution checked for hemostasis which was established and confirmed. An x-ray was taken which showed grafts in good position, the plate and screws in good position, and the overall alignment to be good. We then proceeded with closure. The platysma was closed with interrupted inverted 2-0 undyed Vicryl suture,  the subcutaneous and subcuticular closed with interrupted inverted  3-0 undyed Vicryl suture. The skin edges were approximated with Dermabond. Following surgery the patient was taken out of cervical traction. To be reversed and the anesthetic and taken to the recovery room for further care.   PLAN OF CARE: Admit for overnight observation  PATIENT DISPOSITION:  PACU - hemodynamically stable.   Delay start of Pharmacological VTE agent (>24hrs) due to surgical blood loss or risk of bleeding:  yes

## 2015-06-19 NOTE — Anesthesia Procedure Notes (Signed)
Procedure Name: Intubation Date/Time: 06/19/2015 10:16 AM Performed by: Charm BargesBUTLER, Kayla [name] Pre-anesthesia Checklist: Patient identified, Emergency Drugs available, Suction available and Patient being monitored Patient Re-evaluated:Patient Re-evaluated prior to inductionOxygen Delivery Method: Circle System Utilized Preoxygenation: Pre-oxygenation with 100% oxygen Intubation Type: IV induction Ventilation: Mask ventilation without difficulty Laryngoscope Size: Mac and 3 Tube type: Oral Tube size: 7.0 mm Number of attempts: 1 Airway Equipment and Method: Stylet Placement Confirmation: ETT inserted through vocal cords under direct vision,  positive ETCO2 and breath sounds checked- equal and bilateral Secured at: 20 cm Tube secured with: Tape Dental Injury: Teeth and Oropharynx as per pre-operative assessment

## 2015-06-19 NOTE — Discharge Summary (Signed)
Physician Discharge Summary  Patient ID: Kayla HaggisLisa Mabe MRN: [id number] DOB/AGE: [birth year] 55 y.o.  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 She has done well following surgery, and is up and ambulating in the halls. Her wound is healing nicely. She has voided. She is asking to be discharged home. We have given her instructions regarding wound care and activities following discharge. She is scheduled to return for follow-up with me in 3 weeks.  Discharge Exam: Blood pressure 104/60, pulse 67, temperature 97.8 F (36.6 C), temperature source Oral, resp. rate 18, height 5\' 3"  (1.6 m), weight 61.236 kg (135 lb), SpO2 100 %.  Disposition: 01-Home or Self Care     Medication List    TAKE these medications        aspirin 81 MG tablet  Take 81 mg by mouth daily.     buPROPion 300 MG 24 hr tablet  Commonly known as:  WELLBUTRIN XL  Take 300 mg by mouth daily.     clonazePAM 1 MG tablet  Commonly known as:  KLONOPIN  Take 1 mg by mouth 2 (two) times daily.     cyclobenzaprine 10 MG tablet  Commonly known as:  FLEXERIL  Take 0.5-1 tablets (5-10 mg total) by mouth 3 (three) times daily as needed for muscle spasms.     estradiol 1 MG tablet  Commonly known as:  ESTRACE  Take 1 mg by mouth daily.     fluticasone 50 MCG/ACT nasal spray  Commonly known as:  FLONASE  Place 2 sprays into both nostrils 2 (two) times daily.     gabapentin 300 MG capsule  Commonly known as:  NEURONTIN  Take 1 capsule (300 mg total) by mouth 3 (three) times  daily.     HYDROcodone-acetaminophen 5-325 MG tablet  Commonly known as:  NORCO/VICODIN  Take 1-2 tablets by mouth every 4 (four) hours as needed (mild pain).     MULTI VITAMIN DAILY PO  Take 1 tablet by mouth daily.     oxyCODONE-acetaminophen 5-325 MG tablet  Commonly known as:  PERCOCET/ROXICET  Take 1-2 tablets by mouth every 4 (four) hours as needed for moderate pain or severe pain.     SUMAtriptan 100 MG tablet  Commonly known as:  IMITREX  Take 1 tablet (100 mg total) by mouth every 2 (two) hours as needed for migraine or headache. May repeat in 2 hours if headache persists or recurs.     traZODone 100 MG tablet  Commonly known as:  DESYREL  Take 100-200 mg by mouth at bedtime.         SignedHewitt Shorts: NUDELMAN,ROBERT W 06/19/2015, 6:23 PM

## 2015-06-19 NOTE — Transfer of Care (Signed)
Immediate Anesthesia Transfer of Care Note  Patient: Kayla HaggisLisa [name]  Procedure(s) Performed: Procedure(s) with comments: Removal of Codman anterior cervical plate;   Anterior Cervcial Decompression Fusion Cervical Four-Five (N/A) - left side approach  Patient Location: PACU  Anesthesia Type:General  Level of Consciousness: awake, oriented and patient cooperative  Airway & Oxygen Therapy: Patient Spontanous Breathing and Patient connected to nasal cannula oxygen  Post-op Assessment: Report given to RN and Post -op Vital signs reviewed and stable  Post vital signs: Reviewed and stable  Last Vitals:  Filed Vitals:   06/19/15 0855  BP: 96/58  Pulse: 92  Temp: 36.6 C  Resp: 18    Last Pain:  Filed Vitals:   06/19/15 0857  PainSc: 10-Worst pain ever      Patients Stated Pain Goal: 4 (06/19/15 0855)  Complications: No apparent anesthesia complications

## 2015-06-19 NOTE — Anesthesia Preprocedure Evaluation (Addendum)
Anesthesia Evaluation  Patient identified by MRN, date of birth, ID band Patient awake    Reviewed: Allergy & Precautions, H&P , Patient's Chart, lab work & pertinent test results, reviewed documented beta blocker date and time   Airway Mallampati: II  TM Distance: >3 FB Neck ROM: full    Dental no notable dental hx.    Pulmonary Current Smoker,    Pulmonary exam normal breath sounds clear to auscultation       Cardiovascular  Rhythm:regular Rate:Normal     Neuro/Psych    GI/Hepatic   Endo/Other    Renal/GU      Musculoskeletal   Abdominal   Peds  Hematology   Anesthesia Other Findings Hilda BladesLou Gehrig disease .... Seen by neurology : Nerve conduction studies done on both upper extremities were within normal limits. No evidence of a neuropathy is seen. EMG evaluation of both upper extremities are essentially normal. EMG pattern is consistent with poor motor effort or tremor. No acute or chronic denervation is seen. There is no evidence of any neuromuscular disorder on this evaluation.   Primary lateral sclerosis (HCC)          Chronic kidney disease pt states "I have Stage 3 kidney disease"  Complication of anesthesia..Pt stated "sometimes I have trouble waking up"   Shortness of breath dyspnea  with exertion  Migraine Takes Imitrex Depression   Anxiety         Reproductive/Obstetrics                            Anesthesia Physical Anesthesia Plan  ASA: II  Anesthesia Plan: General   Post-op Pain Management:    Induction: Intravenous  Airway Management Planned: Oral ETT  Additional Equipment:   Intra-op Plan:   Post-operative Plan: Extubation in OR  Informed Consent: I have reviewed the patients History and Physical, chart, labs and discussed the procedure including the risks, benefits and alternatives for the proposed anesthesia with the patient or authorized representative  who has indicated his/her understanding and acceptance.   Dental Advisory Given and Dental advisory given  Plan Discussed with: CRNA and Surgeon  Anesthesia Plan Comments: (  Discussed general anesthesia, including possible nausea, instrumentation of airway, sore throat,pulmonary aspiration, etc. I asked if the were any outstanding questions, or  concerns before we proceeded. )        Anesthesia Quick Evaluation

## 2015-06-19 NOTE — Progress Notes (Signed)
Pt doing well. Pt and family given D/C instructions with Rx's, verbal understanding was provided. Pt's incision is clean and dry with no sign of infection. Pt's IV was removed prior to D/C. Pt D/C'd home via wheelchair @1900  per MD order. Pt is stable @ D/C and has no other needs at this time. Rema FendtAshley Ethan Kasperski, RN

## 2015-06-19 NOTE — Anesthesia Postprocedure Evaluation (Signed)
Anesthesia Post Note  Patient: Kayla HaggisLisa [name]  Procedure(s) Performed: Procedure(s) (LRB): Removal of Codman anterior cervical plate;   Anterior Cervcial Decompression Fusion Cervical Four-Five (N/A)  Patient location during evaluation: PACU Anesthesia Type: General Level of consciousness: sedated Pain management: satisfactory to patient Vital Signs Assessment: post-procedure vital signs reviewed and stable Respiratory status: spontaneous breathing Cardiovascular status: stable Anesthetic complications: no    Last Vitals:  Filed Vitals:   06/19/15 1350 06/19/15 1430  BP: 99/49 104/60  Pulse: 57 67  Temp: 36.4 C 36.6 C  Resp: 16 18    Last Pain:  Filed Vitals:   06/19/15 1519  PainSc: 3                  Starr Engel EDWARD

## 2015-06-20 ENCOUNTER — Encounter (HOSPITAL_COMMUNITY): Payer: Self-pay | Admitting: Neurosurgery

## 2015-07-03 ENCOUNTER — Ambulatory Visit: Payer: Self-pay | Admitting: Family Medicine

## 2015-07-08 ENCOUNTER — Emergency Department (HOSPITAL_BASED_OUTPATIENT_CLINIC_OR_DEPARTMENT_OTHER)
Admission: EM | Admit: 2015-07-08 | Discharge: 2015-07-08 | Disposition: A | Discharge status: Home / Self Care | Payer: Medicare Other | Attending: Emergency Medicine | Admitting: Emergency Medicine

## 2015-07-08 ENCOUNTER — Emergency Department (HOSPITAL_BASED_OUTPATIENT_CLINIC_OR_DEPARTMENT_OTHER): Payer: Medicare Other

## 2015-07-08 ENCOUNTER — Encounter (HOSPITAL_BASED_OUTPATIENT_CLINIC_OR_DEPARTMENT_OTHER): Payer: Self-pay | Admitting: *Deleted

## 2015-07-08 ENCOUNTER — Encounter: Payer: Self-pay | Admitting: Family Medicine

## 2015-07-08 DIAGNOSIS — Z79899 Other long term (current) drug therapy: Secondary | ICD-10-CM | POA: Diagnosis not present

## 2015-07-08 DIAGNOSIS — F17210 Nicotine dependence, cigarettes, uncomplicated: Secondary | ICD-10-CM | POA: Diagnosis not present

## 2015-07-08 DIAGNOSIS — F329 Major depressive disorder, single episode, unspecified: Secondary | ICD-10-CM | POA: Insufficient documentation

## 2015-07-08 DIAGNOSIS — N183 Chronic kidney disease, stage 3 (moderate): Secondary | ICD-10-CM | POA: Diagnosis not present

## 2015-07-08 DIAGNOSIS — Z7982 Long term (current) use of aspirin: Secondary | ICD-10-CM | POA: Insufficient documentation

## 2015-07-08 DIAGNOSIS — J189 Pneumonia, unspecified organism: Secondary | ICD-10-CM | POA: Diagnosis not present

## 2015-07-08 DIAGNOSIS — R05 Cough: Secondary | ICD-10-CM | POA: Diagnosis present

## 2015-07-08 DIAGNOSIS — M1990 Unspecified osteoarthritis, unspecified site: Secondary | ICD-10-CM | POA: Insufficient documentation

## 2015-07-08 DIAGNOSIS — Z7951 Long term (current) use of inhaled steroids: Secondary | ICD-10-CM | POA: Insufficient documentation

## 2015-07-08 DIAGNOSIS — M5030 Other cervical disc degeneration, unspecified cervical region: Secondary | ICD-10-CM | POA: Diagnosis not present

## 2015-07-08 DIAGNOSIS — E785 Hyperlipidemia, unspecified: Secondary | ICD-10-CM | POA: Diagnosis not present

## 2015-07-08 MED ORDER — SUMATRIPTAN SUCCINATE 6 MG/0.5ML ~~LOC~~ SOLN
6.0000 mg | Freq: Once | SUBCUTANEOUS | Status: AC
  Administered 2015-07-08: 6 mg via SUBCUTANEOUS
  Filled 2015-07-08: qty 0.5

## 2015-07-08 MED ORDER — HYDROCOD POLST-CPM POLST ER 10-8 MG/5ML PO SUER
5.0000 mL | Freq: Once | ORAL | Status: AC
  Administered 2015-07-08: 5 mL via ORAL
  Filled 2015-07-08: qty 5

## 2015-07-08 MED ORDER — AZITHROMYCIN 250 MG PO TABS: 250.0000 mg | ORAL_TABLET | Freq: Every day | ORAL | Status: DC

## 2015-07-08 MED ORDER — IPRATROPIUM BROMIDE 0.02 % IN SOLN
0.5000 mg | Freq: Once | RESPIRATORY_TRACT | Status: AC
  Administered 2015-07-08: 0.5 mg via RESPIRATORY_TRACT
  Filled 2015-07-08: qty 2.5

## 2015-07-08 MED ORDER — ALBUTEROL SULFATE HFA 108 (90 BASE) MCG/ACT IN AERS: 2.0000 | INHALATION_SPRAY | RESPIRATORY_TRACT | Status: AC | PRN

## 2015-07-08 MED ORDER — ALBUTEROL SULFATE (2.5 MG/3ML) 0.083% IN NEBU: 2.5000 mg | INHALATION_SOLUTION | RESPIRATORY_TRACT | Status: DC | PRN

## 2015-07-08 MED ORDER — ALBUTEROL SULFATE (2.5 MG/3ML) 0.083% IN NEBU
5.0000 mg | INHALATION_SOLUTION | Freq: Once | RESPIRATORY_TRACT | Status: AC
  Administered 2015-07-08: 5 mg via RESPIRATORY_TRACT
  Filled 2015-07-08: qty 6

## 2015-07-08 MED ORDER — SUMATRIPTAN SUCCINATE 100 MG PO TABS: 100.0000 mg | ORAL_TABLET | ORAL | Status: DC | PRN

## 2015-07-08 MED ORDER — ONDANSETRON 4 MG PO TBDP: 4.0000 mg | ORAL_TABLET | Freq: Three times a day (TID) | ORAL | Status: DC | PRN

## 2015-07-08 MED ORDER — HYDROCOD POLST-CPM POLST ER 10-8 MG/5ML PO SUER: 5.0000 mL | Freq: Two times a day (BID) | ORAL | Status: DC | PRN

## 2015-07-08 MED ORDER — ONDANSETRON 4 MG PO TBDP
4.0000 mg | ORAL_TABLET | Freq: Once | ORAL | Status: AC
  Administered 2015-07-08: 4 mg via ORAL
  Filled 2015-07-08: qty 1

## 2015-07-08 MED ORDER — AZITHROMYCIN 250 MG PO TABS
500.0000 mg | ORAL_TABLET | Freq: Once | ORAL | Status: AC
  Administered 2015-07-08: 500 mg via ORAL
  Filled 2015-07-08: qty 2

## 2015-07-08 NOTE — ED Notes (Signed)
Productive cough, sinus pressure, nasal congestion x 2-3 days.  Denies fever.

## 2015-07-08 NOTE — ED Notes (Signed)
NIF -29cmH2O, unable to produce VC secondary to cough.  HHN in progress

## 2015-07-08 NOTE — ED Notes (Signed)
Patient instructed on use of Flutter Valve.  Tolerated well.

## 2015-07-08 NOTE — Discharge Instructions (Signed)

## 2015-07-08 NOTE — ED Provider Notes (Signed)
TIME SEEN: 6:00 AM  CHIEF COMPLAINT: Sinus pressure, nasal congestion, productive cough, myalgias, chills  HPI: Pt is a 55 y.o. female with history of neurologic functional disorder, chronic kidney disease, migraines, degenerative disc disease status post C4-5 ACDF on 06/19/15 with Dr. Newell Coral who presents to the emergency department with complaints of 3 days of chills, body aches, runny nose, cough with green, brown sputum production, diffuse headaches than her prior migraines, sinus pressure, loss of appetite and nausea. Denies any vomiting, diarrhea. Denies rash. No known sick contacts or recent travel. Hasn't states he was concerned because she seemed to have "shallow breathing" at home. Patient was previously diagnosed with ALS and was on hospice at one point. She's been followed closely by neurology at [facility] to seems to think this is neurologic functional disorder and have referred her to follow-up with psychiatry. They think this is related to previous trauma.  She is not on oxygen at home. Does wear C Pap at night. She does report previous history of bronchospasm and reports she has a nebulizer.    ROS: See HPI Constitutional: no fever  Eyes: no drainage  ENT:  runny nose   Cardiovascular:  no chest pain  Resp: no SOB  GI: no vomiting GU: no dysuria Integumentary: no rash  Allergy: no hives  Musculoskeletal: no leg swelling  Neurological: no slurred speech ROS otherwise negative  PAST MEDICAL HISTORY/PAST SURGICAL HISTORY:  Past Medical History  Diagnosis Date  . 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 Primary lateral sclerosis (HCC)   . Hyperlipidemia   . G tube feedings (HCC)   . Chronic kidney disease     pt stated "I have Stage 3 kidney disease"  . Complication of anesthesia     Pt stated "sometimes I have trouble waking up"  . Shortness of breath dyspnea     with exertion  . Migraine     Takes Imitrex  . Depression   . Anxiety   . DDD (degenerative disc disease), cervical    . Osteoarthritis of back   . Constipation due to pain medication   . Neuromuscular disease (HCC)     MEDICATIONS:  Prior to Admission medications   Medication Sig Start Date End Date Taking? Authorizing Provider  aspirin 81 MG tablet Take 81 mg by mouth daily.    Historical Provider, MD  buPROPion (WELLBUTRIN XL) 300 MG 24 hr tablet Take 300 mg by mouth daily.    Historical Provider, MD  clonazePAM (KLONOPIN) 1 MG tablet Take 1 mg by mouth 2 (two) times daily.     Historical Provider, MD  cyclobenzaprine (FLEXERIL) 10 MG tablet Take 0.5-1 tablets (5-10 mg total) by mouth 3 (three) times daily as needed for muscle spasms. 06/19/15   Shirlean Kelly, MD  estradiol (ESTRACE) 1 MG tablet Take 1 mg by mouth daily.    Historical Provider, MD  fluticasone (FLONASE) 50 MCG/ACT nasal spray Place 2 sprays into both nostrils 2 (two) times daily.    Historical Provider, MD  gabapentin (NEURONTIN) 300 MG capsule Take 1 capsule (300 mg total) by mouth 3 (three) times daily. Patient not taking: Reported on 06/12/2015 05/31/14   Joseph Art, DO  HYDROcodone-acetaminophen (NORCO/VICODIN) 5-325 MG tablet Take 1-2 tablets by mouth every 4 (four) hours as needed (mild pain). 06/19/15   Shirlean Kelly, MD  Multiple Vitamin (MULTI VITAMIN DAILY PO) Take 1 tablet by mouth daily.     Historical Provider, MD  oxyCODONE-acetaminophen (PERCOCET/ROXICET) 5-325 MG tablet Take  1-2 tablets by mouth every 4 (four) hours as needed for moderate pain or severe pain. 05/26/15   Leta BaptistEmily Roe Nguyen, MD  SUMAtriptan (IMITREX) 100 MG tablet Take 1 tablet (100 mg total) by mouth every 2 (two) hours as needed for migraine or headache. May repeat in 2 hours if headache persists or recurs. 05/31/14   Joseph ArtJessica U Vann, DO  traZODone (DESYREL) 100 MG tablet Take 100-200 mg by mouth at bedtime.     Historical Provider, MD    ALLERGIES:  Allergies  Allergen Reactions  . Dilaudid [Hydromorphone Hcl] Itching    Can take with Benadryl  .  Codeine Nausea And Vomiting and Rash    Patient hasn't had it Codeine She doesn't think she has an existing allergy    SOCIAL HISTORY:  Social History  Substance Use Topics  . Smoking status: Current Every Day Smoker -- 0.50 packs/day for 30 years    Types: Cigarettes  . Smokeless tobacco: Not on file  . Alcohol Use: Yes     Comment: occasional    FAMILY HISTORY: Family History  Problem Relation Age of Onset  . Hypertension Brother     EXAM: BP 137/89 mmHg  Pulse 103  Temp(Src) 98.3 F (36.8 C)  Resp 18  Ht 5\' 3"  (1.6 m)  Wt 135 lb (61.236 kg)  BMI 23.92 kg/m2  SpO2 99% CONSTITUTIONAL: Alert and oriented and responds appropriately to questions. Well-appearing; well-nourished, Afebrile and nontoxic appearing HEAD: Normocephalic EYES: Conjunctivae clear, PERRL ENT: normal nose; no rhinorrhea; moist mucous membranes; No pharyngeal erythema or petechiae, no tonsillar hypertrophy or exudate, no uvular deviation, no trismus or drooling, normal phonation, no stridor, no dental caries or abscess noted, no Ludwig's angina, tongue sits flat in the bottom of the mouth NECK: Supple, no meningismus, no LAD; Healing surgical incision site noted to the anterior left neck without erythema, warmth, drainage, tenderness CARD: RRR; S1 and S2 appreciated; no murmurs, no clicks, no rubs, no gallops CHEST:  Tender to palpation over the right chest wall without crepitus, ecchymosis or deformity RESP: Normal chest excursion without splinting or tachypnea; breath sounds clear and equal bilaterally; does have some mild scattered x-ray wheezes that resolve when she coughs, no rhonchi, no rales, no hypoxia or respiratory distress, speaking full sentences ABD/GI: Normal bowel sounds; non-distended; soft, non-tender, no rebound, no guarding, no peritoneal signs BACK:  The back appears normal and is non-tender to palpation, there is no CVA tenderness EXT: Normal ROM in all joints; non-tender to palpation;  no edema; normal capillary refill; no cyanosis, no calf tenderness or swelling    SKIN: Normal color for age and race; warm; no rash NEURO: Moves all extremities equally, sensation to light touch intact diffusely, cranial nerves II through XII intact PSYCH: The patient's mood and manner are appropriate. Grooming and personal hygiene are appropriate.  MEDICAL DECISION MAKING: Patient here with symptoms that sound like a viral illness, possible influenza. States she did have a flu shot last year. Does not appear toxic today. Hemodynamically stable. Tried NyQuil, Tylenol Sinus, Zicam and vitamin C today without much relief. Did take Imitrex at home prior to arrival with some relief. Headache is now 5/10 and feels like her prior migraines. Nothing to suggest meningitis. We'll obtain chest x-ray to rule out pneumonia. Have discussed with her risk and benefits of Tamiflu and she declines this medication. Discussed with her for chest x-ray does not show pneumonia I feel this is more likely a viral illness. Have recommended  symptomatic treatment. Will give Tussionex, DuoNeb, Imitrex, Zofran in the emergency department. She cannot take NSAIDs because of history of chronic kidney disease.  Given patient's husband is concerned about shallow breathing and she has question of a history of neurologic disease, will obtain forced vital capacity and negative air surgery force. She does not appear to have any respiratory difficulty currently or shallow breathing. Her oxygen saturation is normal.  ED PROGRESS: Patient's respiratory effort appears to be poor intermittently but I feel this is due to poor effort on her part rather than true respiratory muscle weakness. Also seems to be that it causes her pain to take a deep breath and she has right chest wall pain from coughing. She reports feeling better after symptomatically treatment. Her chest x-ray shows possible mild right basilar opacity. Will treat for community acquired  pneumonia with azithromycin. We'll discharge with tussionex, albuterol, zofran. She is already on a Medrol Dosepak for her neck.  Have recommended outpatient follow-up. Discussed return precautions. She verbalized understanding and is comfortable with this plan.   At this time, I do not feel there is any life-threatening condition present. I have reviewed and discussed all results (EKG, imaging, lab, urine as appropriate), exam findings with patient. I have reviewed nursing notes and appropriate previous records.  I feel the patient is safe to be discharged home without further emergent workup. Discussed usual and customary return precautions. Patient and family (if present) verbalize understanding and are comfortable with this plan.  Patient will follow-up with their primary care provider. If they do not have a primary care provider, information for follow-up has been provided to them. All questions have been answered.      Layla MawKristen N Ward, DO 07/08/15 [phone]

## 2015-07-09 NOTE — Telephone Encounter (Signed)
Tiffany-- Can you see if Dr Abner GreenspanBlyth has an opening for an ER follow up for this pt in the next week or so? She is scheduled for a new pt visit with Dr Abner GreenspanBlyth in September.

## 2015-07-15 ENCOUNTER — Ambulatory Visit (INDEPENDENT_AMBULATORY_CARE_PROVIDER_SITE_OTHER): Payer: Medicare Other | Admitting: Family Medicine

## 2015-07-15 ENCOUNTER — Encounter: Payer: Self-pay | Admitting: Family Medicine

## 2015-07-15 ENCOUNTER — Ambulatory Visit (HOSPITAL_BASED_OUTPATIENT_CLINIC_OR_DEPARTMENT_OTHER)
Admission: RE | Admit: 2015-07-15 | Discharge: 2015-07-15 | Disposition: A | Discharge status: Home / Self Care | Payer: Medicare Other | Source: Ambulatory Visit | Attending: Family Medicine | Admitting: Family Medicine

## 2015-07-15 VITALS — BP 132/98 | HR 100 | Temp 98.3°F | Ht 63.0 in | Wt 139.2 lb

## 2015-07-15 DIAGNOSIS — J189 Pneumonia, unspecified organism: Secondary | ICD-10-CM | POA: Insufficient documentation

## 2015-07-15 DIAGNOSIS — M5020 Other cervical disc displacement, unspecified cervical region: Secondary | ICD-10-CM

## 2015-07-15 DIAGNOSIS — Z8619 Personal history of other infectious and parasitic diseases: Secondary | ICD-10-CM

## 2015-07-15 DIAGNOSIS — F418 Other specified anxiety disorders: Secondary | ICD-10-CM | POA: Diagnosis not present

## 2015-07-15 DIAGNOSIS — E782 Mixed hyperlipidemia: Secondary | ICD-10-CM

## 2015-07-15 DIAGNOSIS — F447 Conversion disorder with mixed symptom presentation: Secondary | ICD-10-CM

## 2015-07-15 DIAGNOSIS — J181 Lobar pneumonia, unspecified organism: Secondary | ICD-10-CM

## 2015-07-15 DIAGNOSIS — R739 Hyperglycemia, unspecified: Secondary | ICD-10-CM

## 2015-07-15 HISTORY — DX: Other specified anxiety disorders: F41.8

## 2015-07-15 HISTORY — DX: Pneumonia, unspecified organism: J18.9

## 2015-07-15 HISTORY — DX: Personal history of other infectious and parasitic diseases: Z86.19

## 2015-07-15 MED ORDER — FLUCONAZOLE 150 MG PO TABS: ORAL_TABLET | ORAL | Status: DC

## 2015-07-15 MED ORDER — AMOXICILLIN-POT CLAVULANATE 875-125 MG PO TABS
1.0000 | ORAL_TABLET | Freq: Two times a day (BID) | ORAL | Status: DC
Start: 2015-07-15 — End: 2015-08-28

## 2015-07-15 MED ORDER — HYDROCODONE-HOMATROPINE 5-1.5 MG/5ML PO SYRP: 5.0000 mL | ORAL_SOLUTION | Freq: Three times a day (TID) | ORAL | Status: DC | PRN

## 2015-07-15 NOTE — Patient Instructions (Signed)
Plain mucinex twice daily Encouraged increased rest and hydration, add probiotics, zinc such as Coldeze or Xicam. Treat fevers as needed   Community-Acquired Pneumonia, Adult Pneumonia is an infection of the lungs. There are different types of pneumonia. One type can develop while a person is in a hospital. A different type, called community-acquired pneumonia, develops in people who are not, or have not recently been, in the hospital or other health care facility.  CAUSES Pneumonia may be caused by bacteria, viruses, or funguses. Community-acquired pneumonia is often caused by Streptococcus pneumonia bacteria. These bacteria are often passed from one person to another by breathing in droplets from the cough or sneeze of an infected person. RISK FACTORS The condition is more likely to develop in:  People who havechronic diseases, such as chronic obstructive pulmonary disease (COPD), asthma, congestive heart failure, cystic fibrosis, diabetes, or kidney disease.  People who haveearly-stage or late-stage HIV.  People who havesickle cell disease.  People who havehad their spleen removed (splenectomy).  People who havepoor Administrator.  People who havemedical conditions that increase the risk of breathing in (aspirating) secretions their own mouth and nose.   People who havea weakened immune system (immunocompromised).  People who smoke.  People whotravel to areas where pneumonia-causing germs commonly exist.  People whoare around animal habitats or animals that have pneumonia-causing germs, including birds, bats, rabbits, cats, and farm animals. SYMPTOMS Symptoms of this condition include:  Adry cough.  A wet (productive) cough.  Fever.  Sweating.  Chest pain, especially when breathing deeply or coughing.  Rapid breathing or difficulty breathing.  Shortness of breath.  Shaking chills.  Fatigue.  Muscle aches. DIAGNOSIS Your health care provider will  take a medical history and perform a physical exam. You may also have other tests, including:  Imaging studies of your chest, including X-rays.  Tests to check your blood oxygen level and other blood gases.  Other tests on blood, mucus (sputum), fluid around your lungs (pleural fluid), and urine. If your pneumonia is severe, other tests may be done to identify the specific cause of your illness. TREATMENT The type of treatment that you receive depends on many factors, such as the cause of your pneumonia, the medicines you take, and other medical conditions that you have. For most adults, treatment and recovery from pneumonia may occur at home. In some cases, treatment must happen in a hospital. Treatment may include:  Antibiotic medicines, if the pneumonia was caused by bacteria.  Antiviral medicines, if the pneumonia was caused by a virus.  Medicines that are given by mouth or through an IV tube.  Oxygen.  Respiratory therapy. Although rare, treating severe pneumonia may include:  Mechanical ventilation. This is done if you are not breathing well on your own and you cannot maintain a safe blood oxygen level.  Thoracentesis. This procedureremoves fluid around one lung or both lungs to help you breathe better. HOME CARE INSTRUCTIONS  Take over-the-counter and prescription medicines only as told by your health care provider.  Only takecough medicine if you are losing sleep. Understand that cough medicine can prevent your body's natural ability to remove mucus from your lungs.  If you were prescribed an antibiotic medicine, take it as told by your health care provider. Do not stop taking the antibiotic even if you start to feel better.  Sleep in a semi-upright position at night. Try sleeping in a reclining chair, or place a few pillows under your head.  Do not use tobacco products, including  cigarettes, chewing tobacco, and e-cigarettes. If you need help quitting, ask your health  care provider.  Drink enough water to keep your urine clear or pale yellow. This will help to thin out mucus secretions in your lungs. PREVENTION There are ways that you can decrease your risk of developing community-acquired pneumonia. Consider getting a pneumococcal vaccine if:  You are older than 55 years of age.  You are older than 55 years of age and are undergoing cancer treatment, have chronic lung disease, or have other medical conditions that affect your immune system. Ask your health care provider if this applies to you. There are different types and schedules of pneumococcal vaccines. Ask your health care provider which vaccination option is best for you. You may also prevent community-acquired pneumonia if you take these actions:  Get an influenza vaccine every year. Ask your health care provider which type of influenza vaccine is best for you.  Go to the dentist on a regular basis.  Wash your hands often. Use hand sanitizer if soap and water are not available. SEEK MEDICAL CARE IF:  You have a fever.  You are losing sleep because you cannot control your cough with cough medicine. SEEK IMMEDIATE MEDICAL CARE IF:  You have worsening shortness of breath.  You have increased chest pain.  Your sickness becomes worse, especially if you are an older adult or have a weakened immune system.  You cough up blood.   This information is not intended to replace advice given to you by your health care provider. Make sure you discuss any questions you have with your health care provider.   Document Released: 12/21/2004 Document Revised: 09/11/2014 Document Reviewed: 04/17/2014 Elsevier Interactive Patient Education [employer]

## 2015-07-15 NOTE — Progress Notes (Signed)
Pre visit review using our clinic review tool, if applicable. No additional management support is needed unless otherwise documented below in the visit note. 

## 2015-07-15 NOTE — Assessment & Plan Note (Addendum)
Was better but has worsened again CXR unremarkable today but will start Augmentin, cough syrup, probiotics and Mucinex

## 2015-07-15 NOTE — Assessment & Plan Note (Addendum)
Primary lateral sclerosis/MS initially then 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 Functional lateral sclerosis [facility]y will request records Left sided weakness with left foot drop, slurred speech with fatigue and SOB

## 2015-07-16 LAB — CBC WITH DIFFERENTIAL/PLATELET
BASOS ABS: 0.1 10*3/uL (ref 0.0–0.1)
BASOS PCT: 0.5 % (ref 0.0–3.0)
EOS PCT: 4.2 % (ref 0.0–5.0)
Eosinophils Absolute: 0.5 10*3/uL (ref 0.0–0.7)
HEMATOCRIT: 41.3 % (ref 36.0–46.0)
Hemoglobin: 14 g/dL (ref 12.0–15.0)
LYMPHS ABS: 2.6 10*3/uL (ref 0.7–4.0)
LYMPHS PCT: 21.9 % (ref 12.0–46.0)
MCHC: 33.8 g/dL (ref 30.0–36.0)
MCV: 88.3 fl (ref 78.0–100.0)
MONOS PCT: 4.2 % (ref 3.0–12.0)
Monocytes Absolute: 0.5 10*3/uL (ref 0.1–1.0)
NEUTROS ABS: 8.3 10*3/uL — AB (ref 1.4–7.7)
NEUTROS PCT: 69.2 % (ref 43.0–77.0)
PLATELETS: 377 10*3/uL (ref 150.0–400.0)
RBC: 4.68 Mil/uL (ref 3.87–5.11)
RDW: 12.8 % (ref 11.5–15.5)
WBC: 12.1 10*3/uL — ABNORMAL HIGH (ref 4.0–10.5)

## 2015-07-16 LAB — LIPID PANEL
CHOL/HDL RATIO: 4
Cholesterol: 270 mg/dL — ABNORMAL HIGH (ref 0–200)
HDL: 65.1 mg/dL (ref >39.00)
LDL Cholesterol: 176 mg/dL — ABNORMAL HIGH (ref 0–99)
NONHDL: 204.53
Triglycerides: 141 mg/dL (ref 0.0–149.0)
VLDL: 28.2 mg/dL (ref 0.0–40.0)

## 2015-07-16 LAB — COMPREHENSIVE METABOLIC PANEL
ALK PHOS: 77 U/L (ref 39–117)
ALT: 22 U/L (ref 0–35)
AST: 20 U/L (ref 0–37)
Albumin: 4.5 g/dL (ref 3.5–5.2)
BILIRUBIN TOTAL: 0.2 mg/dL (ref 0.2–1.2)
BUN: 9 mg/dL (ref 6–23)
CALCIUM: 10 mg/dL (ref 8.4–10.5)
CO2: 26 meq/L (ref 19–32)
CREATININE: 0.93 mg/dL (ref 0.40–1.20)
Chloride: 103 mEq/L (ref 96–112)
GFR: 66.45 mL/min (ref >60.00)
GLUCOSE: 85 mg/dL (ref 70–99)
Potassium: 4.3 mEq/L (ref 3.5–5.1)
Sodium: 138 mEq/L (ref 135–145)
TOTAL PROTEIN: 7.5 g/dL (ref 6.0–8.3)

## 2015-07-16 LAB — HEMOGLOBIN A1C: Hgb A1c MFr Bld: 5.7 % (ref 4.6–6.5)

## 2015-07-16 LAB — TSH: TSH: 1.04 u[IU]/mL (ref 0.35–4.50)

## 2015-07-18 ENCOUNTER — Other Ambulatory Visit: Payer: Self-pay | Admitting: Family Medicine

## 2015-07-18 ENCOUNTER — Encounter: Payer: Self-pay | Admitting: Family Medicine

## 2015-07-18 MED ORDER — FENOFIBRATE 145 MG PO TABS: 145.0000 mg | ORAL_TABLET | Freq: Every day | ORAL | Status: DC

## 2015-07-18 MED ORDER — COLESEVELAM HCL 625 MG PO TABS: ORAL_TABLET | ORAL | Status: DC

## 2015-07-25 ENCOUNTER — Encounter: Payer: Self-pay | Admitting: Family Medicine

## 2015-07-25 DIAGNOSIS — E782 Mixed hyperlipidemia: Secondary | ICD-10-CM

## 2015-07-25 DIAGNOSIS — R739 Hyperglycemia, unspecified: Secondary | ICD-10-CM | POA: Insufficient documentation

## 2015-07-25 HISTORY — DX: Mixed hyperlipidemia: E78.2

## 2015-07-25 NOTE — Progress Notes (Signed)
Patient ID: Kayla [name], female   DOB: [birth year], 55 y.o.   MRN: [id number]   Subjective:    Patient ID: Kayla [name], female    DOB: 1960/09/22, 55 y.o.   MRN: [id number]  Chief Complaint  Patient presents with  . Follow-up    Pneumonia    HPI Patient is in today for official new patient appointment and ER follow up where she was diagnosed with pneumonia. She was initially treated with antibiotics and improved but is worsening again. She is again struggling with fatigue, malaise, myalgias as well as cough, sob, nasal congestion and PND. Sudafed and Mucinex have been marginally helpful, cough syrup not very helpful. Is struggling with neck pain and is following with neurosurgeon Dr Jule Ser at [facility]. Denies CP/palp/HA/fevers/GI or GU c/o. Taking meds as prescribed  Past Medical History  Diagnosis Date  . 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 Primary lateral sclerosis (HCC)   . Hyperlipidemia   . G tube feedings (HCC)   . Chronic kidney disease     pt stated "I have Stage 3 kidney disease"  . Complication of anesthesia     Pt stated "sometimes I have trouble waking up"  . Shortness of breath dyspnea     with exertion  . Migraine     Takes Imitrex  . Depression   . Anxiety   . DDD (degenerative disc disease), cervical   . Osteoarthritis of back   . Constipation due to pain medication   . Neuromuscular disease (HCC)   . History of chicken pox 07/15/2015  . Pneumonia 07/15/2015  . RLL pneumonia 07/15/2015  . Depression with anxiety 07/15/2015  . Functional neurological symptom disorder with mixed symptoms 05/19/2012    2007  Started then was tripping up steps. 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 Left foot drop but has worked hard not to need AFO and is stronger now.     . Hyperlipidemia, mixed 07/25/2015    Past Surgical History  Procedure Laterality Date  . Cholecystectomy    . Appendectomy    . Peg placement    . Neck surgery    .  Esophagogastroduodenoscopy N/A 05/19/2012    Procedure: ESOPHAGOGASTRODUODENOSCOPY (EGD);  Surgeon: Theda Belfast, MD;  Location: [facility] ENDOSCOPY;  Service: Endoscopy;  Laterality: N/A;  . Colonoscopy    . Ectopic pregnancy surgery      X 4 and miscarriage  . Breast enhancement surgery    . Anterior cervical decomp/discectomy fusion N/A 06/19/2015    Procedure: Removal of Codman anterior cervical plate;   Anterior Cervcial Decompression Fusion Cervical Four-Five;  Surgeon: Shirlean Kelly, MD;  Location: [facility] NEURO ORS;  Service: Neurosurgery;  Laterality: N/A;  left side approach  . Abdominal hysterectomy      TAH b/l [facility], Dr Loreta Ave    Family History  Problem Relation Age of Onset  . Hypertension Brother   . Alcohol abuse Brother   . Alcohol abuse Mother   . Cancer Mother     pancreatic  . Heart disease Father   . Alcohol abuse Father   . Heart disease Paternal Uncle   . Alcohol abuse Paternal Uncle     Social History   Social History  . Marital Status: Single    Spouse Name: N/A  . Number of Children: N/A  . Years of Education: N/A   Occupational History  . Not on file.   Social History Main Topics  .  Smoking status: Current Every Day Smoker -- 0.50 packs/day for 30 years    Types: Cigarettes  . Smokeless tobacco: Not on file  . Alcohol Use: Yes     Comment: occasional  . Drug Use: No  . Sexual Activity: Yes    Birth Control/ Protection: Surgical   Other Topics Concern  . Not on file   Social History Narrative   Lives with boyfriend, widowed, then divorced. No major dietary restrictions. 1/2 ppd, drinks a small amount socially    Outpatient Prescriptions Prior to Visit  Medication Sig Dispense Refill  . albuterol (PROVENTIL HFA;VENTOLIN HFA) 108 (90 Base) MCG/ACT inhaler Inhale 2 puffs into the lungs every 4 (four) hours as needed for wheezing or shortness of breath. 1 Inhaler 1  . albuterol (PROVENTIL) (2.5 MG/3ML) 0.083% nebulizer solution Take 3 mLs (2.5 mg  total) by nebulization every 2 (two) hours as needed for wheezing or shortness of breath. 75 mL 1  . buPROPion (WELLBUTRIN XL) 300 MG 24 hr tablet Take 300 mg by mouth daily.    . clonazePAM (KLONOPIN) 1 MG tablet Take 1 mg by mouth 2 (two) times daily.     Marland Kitchen estradiol (ESTRACE) 1 MG tablet Take 1 mg by mouth daily.    . fluticasone (FLONASE) 50 MCG/ACT nasal spray Place 2 sprays into both nostrils 2 (two) times daily.    . ondansetron (ZOFRAN ODT) 4 MG disintegrating tablet Take 1 tablet (4 mg total) by mouth every 8 (eight) hours as needed for nausea or vomiting. 15 tablet 0  . SUMAtriptan (IMITREX) 100 MG tablet Take 1 tablet (100 mg total) by mouth every 2 (two) hours as needed for migraine or headache. May repeat in 2 hours if headache persists or recurs. 15 tablet 0  . traZODone (DESYREL) 100 MG tablet Take 100-200 mg by mouth at bedtime.     . chlorpheniramine-HYDROcodone (TUSSIONEX PENNKINETIC ER) 10-8 MG/5ML SUER Take 5 mLs by mouth every 12 (twelve) hours as needed for cough. 100 mL 0  . cyclobenzaprine (FLEXERIL) 10 MG tablet Take 0.5-1 tablets (5-10 mg total) by mouth 3 (three) times daily as needed for muscle spasms. 50 tablet 0  . aspirin 81 MG tablet Take 81 mg by mouth daily. Reported on 07/15/2015    . Multiple Vitamin (MULTI VITAMIN DAILY PO) Take 1 tablet by mouth daily.     Marland Kitchen azithromycin (ZITHROMAX) 250 MG tablet Take 1 tablet (250 mg total) by mouth daily. Start on 07/09/15 4 tablet 0  . HYDROcodone-acetaminophen (NORCO/VICODIN) 5-325 MG tablet Take 1-2 tablets by mouth every 4 (four) hours as needed (mild pain). 50 tablet 0  . oxyCODONE-acetaminophen (PERCOCET/ROXICET) 5-325 MG tablet Take 1-2 tablets by mouth every 4 (four) hours as needed for moderate pain or severe pain. 20 tablet 0   No facility-administered medications prior to visit.    Allergies  Allergen Reactions  . Dilaudid [Hydromorphone Hcl] Itching    Can take with Benadryl  . Codeine Nausea And Vomiting and  Rash    Patient hasn't had it Codeine She doesn't think she has an existing allergy    Review of Systems  Constitutional: Positive for malaise/fatigue. Negative for fever and chills.  HENT: Positive for congestion. Negative for hearing loss.   Eyes: Negative for discharge.  Respiratory: Positive for cough and sputum production. Negative for shortness of breath.   Cardiovascular: Negative for chest pain, palpitations and leg swelling.  Gastrointestinal: Negative for heartburn, nausea, vomiting, abdominal pain, diarrhea, constipation and blood  in stool.  Genitourinary: Negative for dysuria, urgency, frequency and hematuria.  Musculoskeletal: Positive for myalgias and back pain. Negative for falls.  Skin: Negative for rash.  Neurological: Negative for dizziness, sensory change, loss of consciousness, weakness and headaches.  Endo/Heme/Allergies: Negative for environmental allergies. Does not bruise/bleed easily.  Psychiatric/Behavioral: Positive for depression. Negative for suicidal ideas. The patient has insomnia. The patient is not nervous/anxious.        Objective:    Physical Exam  Constitutional: She is oriented to person, place, and time. She appears well-developed and well-nourished. No distress.  HENT:  Head: Normocephalic and atraumatic.  Nose: Nose normal.  Eyes: Right eye exhibits no discharge. Left eye exhibits no discharge.  Neck: Normal range of motion. Neck supple.  Cardiovascular: Normal rate and regular rhythm.   No murmur heard. Pulmonary/Chest: Effort normal and breath sounds normal.  Abdominal: Soft. Bowel sounds are normal. There is no tenderness.  Musculoskeletal: She exhibits no edema.  Neurological: She is alert and oriented to person, place, and time.  Skin: Skin is warm and dry.  Psychiatric: She has a normal mood and affect.  Nursing note and vitals reviewed.   BP 132/98 mmHg  Pulse 100  Temp(Src) 98.3 F (36.8 C) (Oral)  Ht 5\' 3"  (1.6 m)  Wt 139  lb 4 oz (63.163 kg)  BMI 24.67 kg/m2  SpO2 95% Wt Readings from Last 3 Encounters:  07/15/15 139 lb 4 oz (63.163 kg)  07/08/15 135 lb (61.236 kg)  06/19/15 135 lb (61.236 kg)     Lab Results  Component Value Date   WBC 12.1* 07/15/2015   HGB 14.0 07/15/2015   HCT 41.3 07/15/2015   PLT 377.0 07/15/2015   GLUCOSE 85 07/15/2015   CHOL 270* 07/15/2015   TRIG 141.0 07/15/2015   HDL 65.10 07/15/2015   LDLCALC 176* 07/15/2015   ALT 22 07/15/2015   AST 20 07/15/2015   NA 138 07/15/2015   K 4.3 07/15/2015   CL 103 07/15/2015   CREATININE 0.93 07/15/2015   BUN 9 07/15/2015   CO2 26 07/15/2015   TSH 1.04 07/15/2015   HGBA1C 5.7 07/15/2015    Lab Results  Component Value Date   TSH 1.04 07/15/2015   Lab Results  Component Value Date   WBC 12.1* 07/15/2015   HGB 14.0 07/15/2015   HCT 41.3 07/15/2015   MCV 88.3 07/15/2015   PLT 377.0 07/15/2015   Lab Results  Component Value Date   NA 138 07/15/2015   K 4.3 07/15/2015   CO2 26 07/15/2015   GLUCOSE 85 07/15/2015   BUN 9 07/15/2015   CREATININE 0.93 07/15/2015   BILITOT 0.2 07/15/2015   ALKPHOS 77 07/15/2015   AST 20 07/15/2015   ALT 22 07/15/2015   PROT 7.5 07/15/2015   ALBUMIN 4.5 07/15/2015   CALCIUM 10.0 07/15/2015   ANIONGAP 12 06/19/2015   GFR 66.45 07/15/2015   Lab Results  Component Value Date   CHOL 270* 07/15/2015   Lab Results  Component Value Date   HDL 65.10 07/15/2015   Lab Results  Component Value Date   LDLCALC 176* 07/15/2015   Lab Results  Component Value Date   TRIG 141.0 07/15/2015   Lab Results  Component Value Date   CHOLHDL 4 07/15/2015   Lab Results  Component Value Date   HGBA1C 5.7 07/15/2015       Assessment & Plan:   Problem List Items Addressed This Visit    Functional neurological symptom disorder with  mixed symptoms - Primary    Primary lateral sclerosis/MS initially then 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 Functional lateral sclerosis Follows with [facility] Dr Providence LaniusHowell, neurology will  request records Left sided weakness with left foot drop, slurred speech with fatigue and SOB      Relevant Medications   HYDROcodone-homatropine (HYCODAN) 5-1.5 MG/5ML syrup   Other Relevant Orders   DG Chest 2 View (Completed)   CBC w/Diff (Completed)   Hemoglobin A1c (Completed)   Comprehensive metabolic panel (Completed)   Lipid panel (Completed)   TSH (Completed)   HNP (herniated nucleus pulposus), cervical    [facility] Dr Jule SerNudleman      History of chicken pox   Relevant Medications   HYDROcodone-homatropine (HYCODAN) 5-1.5 MG/5ML syrup   Other Relevant Orders   DG Chest 2 View (Completed)   CBC w/Diff (Completed)   Hemoglobin A1c (Completed)   Comprehensive metabolic panel (Completed)   Lipid panel (Completed)   TSH (Completed)   RLL pneumonia    Was better but has worsened again CXR unremarkable today but will start Augmentin, cough syrup, probiotics and Mucinex      Relevant Medications   HYDROcodone-homatropine (HYCODAN) 5-1.5 MG/5ML syrup   amoxicillin-clavulanate (AUGMENTIN) 875-125 MG tablet   fluconazole (DIFLUCAN) 150 MG tablet   Other Relevant Orders   DG Chest 2 View (Completed)   CBC w/Diff (Completed)   Hemoglobin A1c (Completed)   Comprehensive metabolic panel (Completed)   Lipid panel (Completed)   TSH (Completed)   Depression with anxiety    Doing well on current meds no changes      Relevant Medications   HYDROcodone-homatropine (HYCODAN) 5-1.5 MG/5ML syrup   Other Relevant Orders   DG Chest 2 View (Completed)   CBC w/Diff (Completed)   Hemoglobin A1c (Completed)   Comprehensive metabolic panel (Completed)   Lipid panel (Completed)   TSH (Completed)   Hyperglycemia     minimize simple carbs. Increase exercise as tolerated.       Relevant Medications   HYDROcodone-homatropine (HYCODAN) 5-1.5 MG/5ML syrup   Other Relevant Orders   DG Chest 2 View (Completed)   CBC w/Diff (Completed)   Hemoglobin A1c (Completed)     Comprehensive metabolic panel (Completed)   Lipid panel (Completed)   TSH (Completed)   Hyperlipidemia, mixed    Encouraged heart healthy diet, increase exercise, avoid trans fats, consider a krill oil cap daily. Start Fenofibrate.          I have discontinued Ms. Mabe's oxyCODONE-acetaminophen, cyclobenzaprine, HYDROcodone-acetaminophen, azithromycin, and chlorpheniramine-HYDROcodone. I am also having her start on HYDROcodone-homatropine and amoxicillin-clavulanate. Additionally, I am having her maintain her buPROPion, clonazePAM, traZODone, estradiol, Multiple Vitamin (MULTI VITAMIN DAILY PO), aspirin, fluticasone, albuterol, albuterol, SUMAtriptan, ondansetron, and fluconazole.  Meds ordered this encounter  Medications  . HYDROcodone-homatropine (HYCODAN) 5-1.5 MG/5ML syrup    Sig: Take 5 mLs by mouth every 8 (eight) hours as needed for cough.    Dispense:  120 mL    Refill:  0  . amoxicillin-clavulanate (AUGMENTIN) 875-125 MG tablet    Sig: Take 1 tablet by mouth 2 (two) times daily.    Dispense:  20 tablet    Refill:  0  . DISCONTD: fluconazole (DIFLUCAN) 150 MG tablet    Sig: Take 1 by  Mouth once a week for 2 weeks.  . fluconazole (DIFLUCAN) 150 MG tablet    Sig: Take 1 by  Mouth once a week for 2 weeks.    Dispense:  2 tablet  Refill:  0     Penni Homans, MD

## 2015-07-25 NOTE — Assessment & Plan Note (Signed)
minimize simple carbs. Increase exercise as tolerated.  

## 2015-07-25 NOTE — Assessment & Plan Note (Signed)
Encouraged heart healthy diet, increase exercise, avoid trans fats, consider a krill oil cap daily. Start Fenofibrate.

## 2015-07-25 NOTE — Assessment & Plan Note (Signed)
[facility], Dr Jule SerNudleman

## 2015-07-25 NOTE — Assessment & Plan Note (Signed)
Doing well on current meds no changes 

## 2015-08-28 ENCOUNTER — Ambulatory Visit (INDEPENDENT_AMBULATORY_CARE_PROVIDER_SITE_OTHER): Payer: Medicare Other | Admitting: Family Medicine

## 2015-08-28 ENCOUNTER — Ambulatory Visit (HOSPITAL_BASED_OUTPATIENT_CLINIC_OR_DEPARTMENT_OTHER)
Admission: RE | Admit: 2015-08-28 | Discharge: 2015-08-28 | Disposition: A | Discharge status: Home / Self Care | Payer: Medicare Other | Source: Ambulatory Visit | Attending: Family Medicine | Admitting: Family Medicine

## 2015-08-28 ENCOUNTER — Encounter: Payer: Self-pay | Admitting: Family Medicine

## 2015-08-28 VITALS — BP 112/70 | HR 83 | Temp 97.6°F | Ht 63.0 in | Wt 140.4 lb

## 2015-08-28 DIAGNOSIS — R079 Chest pain, unspecified: Secondary | ICD-10-CM | POA: Insufficient documentation

## 2015-08-28 DIAGNOSIS — R739 Hyperglycemia, unspecified: Secondary | ICD-10-CM | POA: Diagnosis not present

## 2015-08-28 MED ORDER — SUMATRIPTAN SUCCINATE 100 MG PO TABS: 100.0000 mg | ORAL_TABLET | ORAL | 11 refills | Status: DC | PRN

## 2015-08-28 MED ORDER — PREDNISONE 20 MG PO TABS: 20.0000 mg | ORAL_TABLET | Freq: Every day | ORAL | 0 refills | Status: DC

## 2015-08-28 MED ORDER — FLUCONAZOLE 150 MG PO TABS: 150.0000 mg | ORAL_TABLET | ORAL | 1 refills | Status: DC

## 2015-08-28 MED ORDER — TIZANIDINE HCL 4 MG PO TABS: 4.0000 mg | ORAL_TABLET | Freq: Four times a day (QID) | ORAL | 1 refills | Status: DC | PRN

## 2015-08-28 NOTE — Patient Instructions (Signed)

## 2015-08-28 NOTE — Progress Notes (Signed)
Pre visit review using our clinic review tool, if applicable. No additional management support is needed unless otherwise documented below in the visit note. 

## 2015-08-28 NOTE — Assessment & Plan Note (Addendum)
Larey SeatFell while getting a boat 3 weeks ago, pain is improving but persists. Xray is unremarkable. Encouraged moist heat and gentle stretching as tolerated. May try NSAIDs and prescription meds as directed and report if symptoms worsen or seek immediate care. Try lidocain patches.

## 2015-09-12 ENCOUNTER — Encounter: Payer: Self-pay | Admitting: Family Medicine

## 2015-09-12 ENCOUNTER — Ambulatory Visit (INDEPENDENT_AMBULATORY_CARE_PROVIDER_SITE_OTHER): Payer: Medicare Other | Admitting: Family Medicine

## 2015-09-12 VITALS — BP 118/72 | HR 64 | Temp 98.0°F | Ht 63.0 in | Wt 140.4 lb

## 2015-09-12 DIAGNOSIS — E782 Mixed hyperlipidemia: Secondary | ICD-10-CM

## 2015-09-12 DIAGNOSIS — N390 Urinary tract infection, site not specified: Secondary | ICD-10-CM | POA: Diagnosis not present

## 2015-09-12 DIAGNOSIS — F447 Conversion disorder with mixed symptom presentation: Secondary | ICD-10-CM | POA: Diagnosis not present

## 2015-09-12 DIAGNOSIS — Z23 Encounter for immunization: Secondary | ICD-10-CM

## 2015-09-12 DIAGNOSIS — G4730 Sleep apnea, unspecified: Secondary | ICD-10-CM | POA: Diagnosis not present

## 2015-09-12 DIAGNOSIS — R739 Hyperglycemia, unspecified: Secondary | ICD-10-CM

## 2015-09-12 DIAGNOSIS — R55 Syncope and collapse: Secondary | ICD-10-CM

## 2015-09-12 DIAGNOSIS — Z0000 Encounter for general adult medical examination without abnormal findings: Secondary | ICD-10-CM

## 2015-09-12 DIAGNOSIS — Z0001 Encounter for general adult medical examination with abnormal findings: Secondary | ICD-10-CM

## 2015-09-12 HISTORY — DX: Sleep apnea, unspecified: G47.30

## 2015-09-12 LAB — URINALYSIS, ROUTINE W REFLEX MICROSCOPIC
Bilirubin Urine: NEGATIVE
Ketones, ur: NEGATIVE
Nitrite: NEGATIVE
SPECIFIC GRAVITY, URINE: 1.01 (ref 1.000–1.030)
TOTAL PROTEIN, URINE-UPE24: NEGATIVE
URINE GLUCOSE: NEGATIVE
Urobilinogen, UA: 0.2 (ref 0.0–1.0)
pH: 5.5 (ref 5.0–8.0)

## 2015-09-12 MED ORDER — CLONAZEPAM 0.5 MG PO TABS: 0.5000 mg | ORAL_TABLET | Freq: Two times a day (BID) | ORAL | 1 refills | Status: DC | PRN

## 2015-09-12 NOTE — Progress Notes (Signed)
Pre visit review using our clinic review tool, if applicable. No additional management support is needed unless otherwise documented below in the visit note. 

## 2015-09-12 NOTE — Assessment & Plan Note (Signed)
2007  Started then was tripping up steps. Then was told she had MS then ALS vs PLS now Functional neurological disorder Had extreme sweating, fatigue. Left foot drop but has worked hard not to need AFO and is stronger now.  Doing much better after spending time in Hospice years ago due to progressive symptoms is now home performing all ADLS but continues to struggle with left sided weakness upper and lower extremities and now worsening fatigue and sweating. Weakness is becoming more pronounced on left. Notes some trembling in lower lip. Referred to [facility] neuro for further consideration

## 2015-09-12 NOTE — Assessment & Plan Note (Signed)
H/o Bipap and VPAP use in past. No longer using it as her neurologic condition, now struggling with increasing fatigue and snoring. Refer to pulmonology for further studies and consideration

## 2015-09-12 NOTE — Patient Instructions (Signed)

## 2015-09-14 LAB — URINE CULTURE: Colony Count: >=100000

## 2015-09-14 NOTE — Progress Notes (Signed)
Patient ID: Kayla [name], female   DOB: [birth year], 55 y.o.   MRN: [id number]   Subjective:    Patient ID: Kayla [name], female    DOB: 1960/02/16, 55 y.o.   MRN: [id number]  Chief Complaint  Patient presents with  . Back Pain    HPI Patient is in today for evaluation of chest wall pain. She fell while getting on a boat about 3 weeks ago and struck her chest along the left axillary line and had severe pain, it is improving but still hurts. Hurts badly when reaching above head. No radicular symptoms down arm. Denies CP/palp/SOB/HA/congestion/fevers/GI or GU c/o. Taking meds as prescribed  Past Medical History:  Diagnosis Date  . Anxiety   . Chronic kidney disease    pt stated "I have Stage 3 kidney disease"  . Complication of anesthesia    Pt stated "sometimes I have trouble waking up"  . Constipation due to pain medication   . DDD (degenerative disc disease), cervical   . Depression   . Depression with anxiety 07/15/2015  . Functional neurological symptom disorder with mixed symptoms 05/19/2012   2007  Started then was tripping up steps. 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 Left foot drop but has worked hard not to need AFO and is stronger now.     . G tube feedings (HCC)   . History of chicken pox 07/15/2015  . Hyperlipidemia   . Hyperlipidemia, mixed 07/25/2015  . 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 Migraine    Takes Imitrex  . Neuromuscular disease (HCC)   . Osteoarthritis of back   . Pneumonia 07/15/2015  . Primary lateral sclerosis (HCC)   . RLL pneumonia 07/15/2015  . Shortness of breath dyspnea    with exertion  . Sleep apnea 09/12/2015   H/o Bipap and VPAP use in past    Past Surgical History:  Procedure Laterality Date  . ABDOMINAL HYSTERECTOMY     TAH b/l SPO, Dr [address]  . ANTERIOR CERVICAL DECOMP/DISCECTOMY FUSION N/A 06/19/2015   Procedure: Removal of Codman anterior cervical plate;   Anterior Cervcial Decompression Fusion Cervical  Four-Five;  Surgeon: Shirlean Kelly, MD;  Location: [facility]URO ORS;  Service: Neurosurgery;  Laterality: N/A;  left side approach  . APPENDECTOMY    . BREAST ENHANCEMENT SURGERY    . CHOLECYSTECTOMY    . COLONOSCOPY    . ECTOPIC PREGNANCY SURGERY     X 4 and miscarriage  . ESOPHAGOGASTRODUODENOSCOPY N/A 05/19/2012   Procedure: ESOPHAGOGASTRODUODENOSCOPY (EGD);  Surgeon: Theda Belfast, MD;  Location: [facility] ENDOSCOPY;  Service: Endoscopy;  Laterality: N/A;  . NECK SURGERY    . PEG PLACEMENT      Family History  Problem Relation Age of Onset  . Hypertension Brother   . Alcohol abuse Brother   . Alcohol abuse Mother   . Cancer Mother     pancreatic  . Heart disease Father   . Alcohol abuse Father   . Heart disease Paternal Uncle   . Alcohol abuse Paternal Uncle     Social History   Social History  . Marital status: Single    Spouse name: N/A  . Number of children: N/A  . Years of education: N/A   Occupational History  . Not on file.   Social History Main Topics  . Smoking status: Current Every Day Smoker    Packs/day: 0.50    Years: 30.00    Types:  Cigarettes  . Smokeless tobacco: Not on file  . Alcohol use Yes     Comment: occasional  . Drug use: No  . Sexual activity: Yes    Birth control/ protection: Surgical   Other Topics Concern  . Not on file   Social History Narrative   Lives with boyfriend, widowed, then divorced. No major dietary restrictions. 1/2 ppd, drinks a small amount socially    Outpatient Medications Prior to Visit  Medication Sig Dispense Refill  . albuterol (PROVENTIL HFA;VENTOLIN HFA) 108 (90 Base) MCG/ACT inhaler Inhale 2 puffs into the lungs every 4 (four) hours as needed for wheezing or shortness of breath. 1 Inhaler 1  . albuterol (PROVENTIL) (2.5 MG/3ML) 0.083% nebulizer solution Take 3 mLs (2.5 mg total) by nebulization every 2 (two) hours as needed for wheezing or shortness of breath. 75 mL 1  . aspirin 81 MG tablet Take 81 mg by mouth  daily. Reported on 07/15/2015    . buPROPion (WELLBUTRIN XL) 300 MG 24 hr tablet Take 300 mg by mouth daily.    . clonazePAM (KLONOPIN) 1 MG tablet Take 1 mg by mouth 2 (two) times daily.     Marland Kitchen. estradiol (ESTRACE) 1 MG tablet Take 1 mg by mouth daily.    . fenofibrate (TRICOR) 145 MG tablet Take 1 tablet (145 mg total) by mouth daily. 30 tablet 3  . fluticasone (FLONASE) 50 MCG/ACT nasal spray Place 2 sprays into both nostrils 2 (two) times daily.    . Multiple Vitamin (MULTI VITAMIN DAILY PO) Take 1 tablet by mouth daily.     . traZODone (DESYREL) 100 MG tablet Take 100-200 mg by mouth at bedtime.     . fluconazole (DIFLUCAN) 150 MG tablet Take 1 by  Mouth once a week for 2 weeks. 2 tablet 0  . ondansetron (ZOFRAN ODT) 4 MG disintegrating tablet Take 1 tablet (4 mg total) by mouth every 8 (eight) hours as needed for nausea or vomiting. 15 tablet 0  . SUMAtriptan (IMITREX) 100 MG tablet Take 1 tablet (100 mg total) by mouth every 2 (two) hours as needed for migraine or headache. May repeat in 2 hours if headache persists or recurs. 15 tablet 0  . amoxicillin-clavulanate (AUGMENTIN) 875-125 MG tablet Take 1 tablet by mouth 2 (two) times daily. 20 tablet 0  . HYDROcodone-homatropine (HYCODAN) 5-1.5 MG/5ML syrup Take 5 mLs by mouth every 8 (eight) hours as needed for cough. 120 mL 0   No facility-administered medications prior to visit.     Allergies  Allergen Reactions  . Dilaudid [Hydromorphone Hcl] Itching    Can take with Benadryl  . Hydrocodone-Homatropine Nausea And Vomiting  . Codeine Nausea And Vomiting and Rash    Patient hasn't had it Codeine She doesn't think she has an existing allergy    Review of Systems  Constitutional: Negative for fever and malaise/fatigue.  HENT: Negative for congestion.   Eyes: Negative for blurred vision.  Respiratory: Negative for shortness of breath.   Cardiovascular: Negative for chest pain, palpitations and leg swelling.  Gastrointestinal:  Negative for abdominal pain, blood in stool and nausea.  Genitourinary: Negative for dysuria and frequency.  Musculoskeletal: Positive for back pain and myalgias. Negative for falls.  Skin: Negative for rash.  Neurological: Negative for dizziness, loss of consciousness and headaches.  Endo/Heme/Allergies: Negative for environmental allergies.  Psychiatric/Behavioral: Negative for depression. The patient is not nervous/anxious.        Objective:    Physical Exam  Constitutional: She  is oriented to person, place, and time. She appears well-developed and well-nourished. No distress.  HENT:  Head: Normocephalic and atraumatic.  Nose: Nose normal.  Eyes: Right eye exhibits no discharge. Left eye exhibits no discharge.  Neck: Normal range of motion. Neck supple.  Cardiovascular: Normal rate and regular rhythm.   No murmur heard. Pulmonary/Chest: Effort normal and breath sounds normal.  Abdominal: Soft. Bowel sounds are normal. There is no tenderness.  Musculoskeletal: She exhibits no edema.  Neurological: She is alert and oriented to person, place, and time.  Skin: Skin is warm and dry.  Psychiatric: She has a normal mood and affect.  Nursing note and vitals reviewed.   BP 112/70 (BP Location: Left Arm, Patient Position: Sitting, Cuff Size: Normal)   Pulse 83   Temp 97.6 F (36.4 C) (Oral)   Ht 5\' 3"  (1.6 m)   Wt 140 lb 6 oz (63.7 kg)   SpO2 96%   BMI 24.87 kg/m  Wt Readings from Last 3 Encounters:  09/12/15 140 lb 6 oz (63.7 kg)  08/28/15 140 lb 6 oz (63.7 kg)  07/15/15 139 lb 4 oz (63.2 kg)     Lab Results  Component Value Date   WBC 12.1 (H) 07/15/2015   HGB 14.0 07/15/2015   HCT 41.3 07/15/2015   PLT 377.0 07/15/2015   GLUCOSE 85 07/15/2015   CHOL 270 (H) 07/15/2015   TRIG 141.0 07/15/2015   HDL 65.10 07/15/2015   LDLCALC 176 (H) 07/15/2015   ALT 22 07/15/2015   AST 20 07/15/2015   NA 138 07/15/2015   K 4.3 07/15/2015   CL 103 07/15/2015   CREATININE 0.93  07/15/2015   BUN 9 07/15/2015   CO2 26 07/15/2015   TSH 1.04 07/15/2015   HGBA1C 5.7 07/15/2015    Lab Results  Component Value Date   TSH 1.04 07/15/2015   Lab Results  Component Value Date   WBC 12.1 (H) 07/15/2015   HGB 14.0 07/15/2015   HCT 41.3 07/15/2015   MCV 88.3 07/15/2015   PLT 377.0 07/15/2015   Lab Results  Component Value Date   NA 138 07/15/2015   K 4.3 07/15/2015   CO2 26 07/15/2015   GLUCOSE 85 07/15/2015   BUN 9 07/15/2015   CREATININE 0.93 07/15/2015   BILITOT 0.2 07/15/2015   ALKPHOS 77 07/15/2015   AST 20 07/15/2015   ALT 22 07/15/2015   PROT 7.5 07/15/2015   ALBUMIN 4.5 07/15/2015   CALCIUM 10.0 07/15/2015   ANIONGAP 12 06/19/2015   GFR 66.45 07/15/2015   Lab Results  Component Value Date   CHOL 270 (H) 07/15/2015   Lab Results  Component Value Date   HDL 65.10 07/15/2015   Lab Results  Component Value Date   LDLCALC 176 (H) 07/15/2015   Lab Results  Component Value Date   TRIG 141.0 07/15/2015   Lab Results  Component Value Date   CHOLHDL 4 07/15/2015   Lab Results  Component Value Date   HGBA1C 5.7 07/15/2015       Assessment & Plan:   Problem List Items Addressed This Visit    Hyperglycemia    hgba1c acceptable, minimize simple carbs. Increase exercise as tolerated.       Pain in the chest - Primary    Larey Seat while getting a boat 3 weeks ago, pain is improving but persists. Xray is unremarkable. Encouraged moist heat and gentle stretching as tolerated. May try NSAIDs and prescription meds as directed and report if  symptoms worsen or seek immediate care. Try lidocain patches.      Relevant Orders   DG Ribs Unilateral Left (Completed)    Other Visit Diagnoses   None.     I have discontinued Ms. Mabe's HYDROcodone-homatropine and amoxicillin-clavulanate. I am also having her start on tiZANidine. Additionally, I am having her maintain her buPROPion, clonazePAM, traZODone, estradiol, Multiple Vitamin (MULTI VITAMIN  DAILY PO), aspirin, fluticasone, albuterol, albuterol, fenofibrate, and SUMAtriptan.  Meds ordered this encounter  Medications  . tiZANidine (ZANAFLEX) 4 MG tablet    Sig: Take 1 tablet (4 mg total) by mouth every 6 (six) hours as needed for muscle spasms.    Dispense:  90 tablet    Refill:  1  . DISCONTD: predniSONE (DELTASONE) 20 MG tablet    Sig: Take 1 tablet (20 mg total) by mouth daily with breakfast.    Dispense:  10 tablet    Refill:  0  . DISCONTD: fluconazole (DIFLUCAN) 150 MG tablet    Sig: Take 1 tablet (150 mg total) by mouth once a week.    Dispense:  2 tablet    Refill:  1  . SUMAtriptan (IMITREX) 100 MG tablet    Sig: Take 1 tablet (100 mg total) by mouth every 2 (two) hours as needed for migraine or headache. May repeat in 2 hours if headache persists or recurs.    Dispense:  15 tablet    Refill:  11     Danise Edge, MD

## 2015-09-14 NOTE — Assessment & Plan Note (Signed)
hgba1c acceptable, minimize simple carbs. Increase exercise as tolerated.  

## 2015-09-15 ENCOUNTER — Encounter: Payer: Self-pay | Admitting: Family Medicine

## 2015-09-15 ENCOUNTER — Other Ambulatory Visit: Payer: Self-pay | Admitting: Family Medicine

## 2015-09-15 MED ORDER — CIPROFLOXACIN HCL 500 MG PO TABS: 500.0000 mg | ORAL_TABLET | Freq: Two times a day (BID) | ORAL | 0 refills | Status: DC

## 2015-09-21 ENCOUNTER — Encounter: Payer: Self-pay | Admitting: Family Medicine

## 2015-09-21 DIAGNOSIS — N390 Urinary tract infection, site not specified: Secondary | ICD-10-CM | POA: Insufficient documentation

## 2015-09-21 DIAGNOSIS — R55 Syncope and collapse: Secondary | ICD-10-CM | POA: Insufficient documentation

## 2015-09-21 DIAGNOSIS — Z0000 Encounter for general adult medical examination without abnormal findings: Secondary | ICD-10-CM

## 2015-09-21 HISTORY — DX: Encounter for general adult medical examination without abnormal findings: Z00.00

## 2015-09-21 NOTE — Assessment & Plan Note (Signed)
Patient encouraged to maintain heart healthy diet, regular exercise, adequate sleep. Consider daily probiotics. Take medications as prescribed. Given and reviewed copy of ACP documents from [place] Secretary of State and encouraged to complete and return 

## 2015-09-21 NOTE — Assessment & Plan Note (Signed)
minimize simple carbs. Increase exercise as tolerated.  

## 2015-09-21 NOTE — Assessment & Plan Note (Signed)
Not recently but encouraged to maintain adequate hydration and report worsening episodes

## 2015-09-21 NOTE — Assessment & Plan Note (Signed)
Urine culture reveals infection, will treat with antibiotics

## 2015-09-21 NOTE — Progress Notes (Signed)
Patient ID: Quaniyah Bugh, female   DOB: [birth year], 55 y.o.   MRN: [id number]   Subjective:    Patient ID: [name], female    DOB: June 19, 1960, 55 y.o.   MRN: [id number]  Chief Complaint  Patient presents with  . Annual Exam    HPI Patient is in today for annual preventative exam and follow up on numerous concerns. She is continuing to struggle with fatigue, restless sleep, weakness and malaise. She still questions her neurologic diagnosis. No recent illness. She notes excessive fatigue and sweating. Denies anxiety but is in fact very anxious about her disease. No cigarettes since November. Denies CP/palp/HA/congestion/fevers/GI or GU c/o. Taking meds as prescribed Past Medical History:  Diagnosis Date  . Anxiety   . Chronic kidney disease    pt stated "I have Stage 3 kidney disease"  . Complication of anesthesia    Pt stated "sometimes I have trouble waking up"  . Constipation due to pain medication   . DDD (degenerative disc disease), cervical   . Depression   . Depression with anxiety 07/15/2015  . Functional neurological symptom disorder with mixed symptoms 05/19/2012   2007  Started then was tripping up steps. 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 Left foot drop but has worked hard not to need AFO and is stronger now.     . G tube feedings (HCC)   . History of chicken pox 07/15/2015  . Hyperlipidemia   . Hyperlipidemia, mixed 07/25/2015  . 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 Migraine    Takes Imitrex  . Neuromuscular disease (HCC)   . Osteoarthritis of back   . Pneumonia 07/15/2015  . Preventative health care 09/21/2015  . Primary lateral sclerosis (HCC)   . RLL pneumonia 07/15/2015  . Shortness of breath dyspnea    with exertion  . Sleep apnea 09/12/2015   H/o Bipap and VPAP use in past    Past Surgical History:  Procedure Laterality Date  . ABDOMINAL HYSTERECTOMY     TAH b/l SPO, Dr [address]  . ANTERIOR CERVICAL DECOMP/DISCECTOMY FUSION N/A  06/19/2015   Procedure: Removal of Codman anterior cervical plate;   Anterior Cervcial Decompression Fusion Cervical Four-Five;  Surgeon: Shirlean Kelly, MD;  Location: [facility] NEUR[facility] ORS;  Service: Neurosurgery;  Laterality: N/A;  left side approach  . APPENDECTOMY    . BREAST ENHANCEMENT SURGERY    . CHOLECYSTECTOMY    . COLONOSCOPY    . ECTOPIC PREGNANCY SURGERY     X 4 and miscarriage  . ESOPHAGOGASTRODUODENOSCOPY N/A 05/19/2012   Procedure: ESOPHAGOGASTRODUODENOSCOPY (EGD);  Surgeon: Theda Belfast, MD;  Location: [facility] ENDOSCOPY;  Service: Endoscopy;  Laterality: N/A;  . NECK SURGERY    . PEG PLACEMENT      Family History  Problem Relation Age of Onset  . Hypertension Brother   . Alcohol abuse Brother   . Alcohol abuse Mother   . Cancer Mother     pancreatic  . Heart disease Father   . Alcohol abuse Father   . Heart disease Paternal Uncle   . Alcohol abuse Paternal Uncle     Social History   Social History  . Marital status: Single    Spouse name: N/A  . Number of children: N/A  . Years of education: N/A   Occupational History  . Not on file.   Social History Main Topics  . Smoking status: Current Every Day Smoker    Packs/day:  0.50    Years: 30.00    Types: Cigarettes  . Smokeless tobacco: Not on file  . Alcohol use Yes     Comment: occasional  . Drug use: No  . Sexual activity: Yes    Birth control/ protection: Surgical   Other Topics Concern  . Not on file   Social History Narrative   Lives with boyfriend, widowed, then divorced. No major dietary restrictions. 1/2 ppd, drinks a small amount socially    Outpatient Medications Prior to Visit  Medication Sig Dispense Refill  . albuterol (PROVENTIL HFA;VENTOLIN HFA) 108 (90 Base) MCG/ACT inhaler Inhale 2 puffs into the lungs every 4 (four) hours as needed for wheezing or shortness of breath. 1 Inhaler 1  . albuterol (PROVENTIL) (2.5 MG/3ML) 0.083% nebulizer solution Take 3 mLs (2.5 mg total) by nebulization  every 2 (two) hours as needed for wheezing or shortness of breath. 75 mL 1  . aspirin 81 MG tablet Take 81 mg by mouth daily. Reported on 07/15/2015    . buPROPion (WELLBUTRIN XL) 300 MG 24 hr tablet Take 300 mg by mouth daily.    Marland Kitchen. estradiol (ESTRACE) 1 MG tablet Take 1 mg by mouth daily.    . fenofibrate (TRICOR) 145 MG tablet Take 1 tablet (145 mg total) by mouth daily. 30 tablet 3  . fluticasone (FLONASE) 50 MCG/ACT nasal spray Place 2 sprays into both nostrils 2 (two) times daily.    . Multiple Vitamin (MULTI VITAMIN DAILY PO) Take 1 tablet by mouth daily.     . SUMAtriptan (IMITREX) 100 MG tablet Take 1 tablet (100 mg total) by mouth every 2 (two) hours as needed for migraine or headache. May repeat in 2 hours if headache persists or recurs. 15 tablet 11  . tiZANidine (ZANAFLEX) 4 MG tablet Take 1 tablet (4 mg total) by mouth every 6 (six) hours as needed for muscle spasms. 90 tablet 1  . traZODone (DESYREL) 100 MG tablet Take 100-200 mg by mouth at bedtime.     . clonazePAM (KLONOPIN) 1 MG tablet Take 1 mg by mouth 2 (two) times daily.     . fluconazole (DIFLUCAN) 150 MG tablet Take 1 by  Mouth once a week for 2 weeks. 2 tablet 0  . fluconazole (DIFLUCAN) 150 MG tablet Take 1 tablet (150 mg total) by mouth once a week. 2 tablet 1  . ondansetron (ZOFRAN ODT) 4 MG disintegrating tablet Take 1 tablet (4 mg total) by mouth every 8 (eight) hours as needed for nausea or vomiting. 15 tablet 0  . predniSONE (DELTASONE) 20 MG tablet Take 1 tablet (20 mg total) by mouth daily with breakfast. 10 tablet 0   No facility-administered medications prior to visit.     Allergies  Allergen Reactions  . Dilaudid [Hydromorphone Hcl] Itching    Can take with Benadryl  . Hydrocodone-Homatropine Nausea And Vomiting  . Codeine Nausea And Vomiting and Rash    Patient hasn't had it Codeine She doesn't think she has an existing allergy    Review of Systems  Constitutional: Positive for malaise/fatigue.  Negative for chills and fever.  HENT: Negative for congestion and hearing loss.   Eyes: Negative for discharge.  Respiratory: Negative for cough, sputum production and shortness of breath.   Cardiovascular: Negative for chest pain, palpitations, orthopnea and leg swelling.  Gastrointestinal: Negative for abdominal pain, blood in stool, constipation, diarrhea, heartburn, nausea and vomiting.  Genitourinary: Positive for frequency and urgency. Negative for dysuria and hematuria.  Musculoskeletal:  Positive for back pain and myalgias. Negative for falls.  Skin: Negative for rash.  Neurological: Negative for dizziness, sensory change, loss of consciousness, weakness and headaches.  Endo/Heme/Allergies: Negative for environmental allergies. Does not bruise/bleed easily.  Psychiatric/Behavioral: Negative for depression and suicidal ideas. The patient has insomnia.        Objective:    Physical Exam  Constitutional: She is oriented to person, place, and time. She appears well-developed and well-nourished. No distress.  HENT:  Head: Normocephalic and atraumatic.  Eyes: Conjunctivae are normal.  Neck: Neck supple. No thyromegaly present.  Cardiovascular: Normal rate, regular rhythm and normal heart sounds.   No murmur heard. Pulmonary/Chest: Effort normal and breath sounds normal. No respiratory distress.  Abdominal: Soft. Bowel sounds are normal. She exhibits no distension and no mass. There is no tenderness.  Musculoskeletal: She exhibits no edema.  Lymphadenopathy:    She has no cervical adenopathy.  Neurological: She is alert and oriented to person, place, and time.  Skin: Skin is warm and dry.  Psychiatric: She has a normal mood and affect. Her behavior is normal.    BP 118/72 (BP Location: Left Arm, Patient Position: Sitting, Cuff Size: Normal)   Pulse 64   Temp 98 F (36.7 C) (Oral)   Ht 5\' 3"  (1.6 m)   Wt 140 lb 6 oz (63.7 kg)   SpO2 94%   BMI 24.87 kg/m  Wt Readings from  Last 3 Encounters:  09/12/15 140 lb 6 oz (63.7 kg)  08/28/15 140 lb 6 oz (63.7 kg)  07/15/15 139 lb 4 oz (63.2 kg)     Lab Results  Component Value Date   WBC 12.1 (H) 07/15/2015   HGB 14.0 07/15/2015   HCT 41.3 07/15/2015   PLT 377.0 07/15/2015   GLUCOSE 85 07/15/2015   CHOL 270 (H) 07/15/2015   TRIG 141.0 07/15/2015   HDL 65.10 07/15/2015   LDLCALC 176 (H) 07/15/2015   ALT 22 07/15/2015   AST 20 07/15/2015   NA 138 07/15/2015   K 4.3 07/15/2015   CL 103 07/15/2015   CREATININE 0.93 07/15/2015   BUN 9 07/15/2015   CO2 26 07/15/2015   TSH 1.04 07/15/2015   HGBA1C 5.7 07/15/2015    Lab Results  Component Value Date   TSH 1.04 07/15/2015   Lab Results  Component Value Date   WBC 12.1 (H) 07/15/2015   HGB 14.0 07/15/2015   HCT 41.3 07/15/2015   MCV 88.3 07/15/2015   PLT 377.0 07/15/2015   Lab Results  Component Value Date   NA 138 07/15/2015   K 4.3 07/15/2015   CO2 26 07/15/2015   GLUCOSE 85 07/15/2015   BUN 9 07/15/2015   CREATININE 0.93 07/15/2015   BILITOT 0.2 07/15/2015   ALKPHOS 77 07/15/2015   AST 20 07/15/2015   ALT 22 07/15/2015   PROT 7.5 07/15/2015   ALBUMIN 4.5 07/15/2015   CALCIUM 10.0 07/15/2015   ANIONGAP 12 06/19/2015   GFR 66.45 07/15/2015   Lab Results  Component Value Date   CHOL 270 (H) 07/15/2015   Lab Results  Component Value Date   HDL 65.10 07/15/2015   Lab Results  Component Value Date   LDLCALC 176 (H) 07/15/2015   Lab Results  Component Value Date   TRIG 141.0 07/15/2015   Lab Results  Component Value Date   CHOLHDL 4 07/15/2015   Lab Results  Component Value Date   HGBA1C 5.7 07/15/2015       Assessment &  Plan:   Problem List Items Addressed This Visit    Functional neurological symptom disorder with mixed symptoms    2007  Started then was tripping up steps. Then was told she had MS then ALS vs PLS now Functional neurological disorder Had extreme sweating, fatigue. Left foot drop but has worked hard  not to need AFO and is stronger now.  Doing much better after spending time in Hospice years ago due to progressive symptoms is now home performing all ADLS but continues to struggle with left sided weakness upper and lower extremities and now worsening fatigue and sweating. Weakness is becoming more pronounced on left. Notes some trembling in lower lip. Referred to [facility] neuro for further consideration       Relevant Orders   Ambulatory referral to Neurology   Hyperglycemia    minimize simple carbs. Increase exercise as tolerated.      Hyperlipidemia, mixed    Encouraged heart healthy diet, increase exercise, avoid trans fats, consider a krill oil cap daily      Sleep apnea    H/o Bipap and VPAP use in past. No longer using it as her neurologic condition, now struggling with increasing fatigue and snoring. Refer to pulmonology for further studies and consideration      Relevant Orders   Ambulatory referral to Pulmonology   Urinary tract infectious disease    Urine culture reveals infection, will treat with antibiotics      Relevant Orders   Urinalysis, Routine w reflex microscopic (not at [facility]) (Completed)   Urine Culture (Completed)   Syncope and collapse    Not recently but encouraged to maintain adequate hydration and report worsening episodes      Relevant Orders   EKG 12-Lead (Completed)   Preventative health care    Patient encouraged to maintain heart healthy diet, regular exercise, adequate sleep. Consider daily probiotics. Take medications as prescribed. Given and reviewed copy of ACP documents from [facility] Secretary of State and encouraged to complete and return       Other Visit Diagnoses    Need for immunization against influenza    -  Primary   Relevant Orders   Flu Vaccine QUAD 36+ mos IM (Completed)   Encounter for immunization       Relevant Medications   clonazePAM (KLONOPIN) 0.5 MG tablet   Other Relevant Orders   Flu Vaccine QUAD 36+ mos IM (Completed)    Ambulatory referral to Neurology   Ambulatory referral to Pulmonology   EKG 12-Lead (Completed)      I have discontinued Ms. Mabe's clonazePAM, ondansetron, fluconazole, predniSONE, and fluconazole. I am also having her start on clonazePAM. Additionally, I am having her maintain her buPROPion, traZODone, estradiol, Multiple Vitamin (MULTI VITAMIN DAILY PO), aspirin, fluticasone, albuterol, albuterol, fenofibrate, tiZANidine, and SUMAtriptan.  Meds ordered this encounter  Medications  . clonazePAM (KLONOPIN) 0.5 MG tablet    Sig: Take 1 tablet (0.5 mg total) by mouth 2 (two) times daily as needed for anxiety.    Dispense:  60 tablet    Refill:  1     Danise Edge, MD

## 2015-09-21 NOTE — Assessment & Plan Note (Signed)
Encouraged heart healthy diet, increase exercise, avoid trans fats, consider a krill oil cap daily 

## 2015-09-30 ENCOUNTER — Encounter: Payer: Self-pay | Admitting: Family Medicine

## 2015-10-01 ENCOUNTER — Encounter: Payer: Self-pay | Admitting: Family Medicine

## 2015-10-16 ENCOUNTER — Encounter: Payer: Self-pay | Admitting: Neurology

## 2015-10-16 ENCOUNTER — Ambulatory Visit (INDEPENDENT_AMBULATORY_CARE_PROVIDER_SITE_OTHER): Payer: Medicare Other | Admitting: Neurology

## 2015-10-16 VITALS — BP 98/62 | HR 78 | Resp 16 | Ht 63.0 in | Wt 141.0 lb

## 2015-10-16 DIAGNOSIS — R5383 Other fatigue: Secondary | ICD-10-CM | POA: Diagnosis not present

## 2015-10-16 DIAGNOSIS — R531 Weakness: Secondary | ICD-10-CM | POA: Diagnosis not present

## 2015-10-16 DIAGNOSIS — R252 Cramp and spasm: Secondary | ICD-10-CM | POA: Diagnosis not present

## 2015-10-16 DIAGNOSIS — R2689 Other abnormalities of gait and mobility: Secondary | ICD-10-CM

## 2015-10-16 DIAGNOSIS — R259 Unspecified abnormal involuntary movements: Secondary | ICD-10-CM

## 2015-10-16 NOTE — Patient Instructions (Signed)
Request an appointment with Dr. Ivan AnchorsHommel at [facility].   We will do an EMG and nerve conduction velocity test, which is an electrical nerve and muscle test, which we will schedule. We will call you with the results.  We will do a brain scan, called MRI and call you with the test results. We will have to schedule you for this on a separate date. This test requires authorization from your insurance, and we will take care of the insurance process.

## 2015-10-16 NOTE — Progress Notes (Signed)
Subjective:    Patient ID: Kayla [name] is a 55 y.o. female.  HPI     Interim history:   Dear Dr. 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 The patient is unaccompanied today. As you know, Kayla [name] is a 55 year old right-handed woman who I have previously seen once before in December 2014 for weakness and a prior diagnosis of possible ALS. Kayla [name] has an underlying medical history of low back pain, neck pain, smoking, hyperlipidemia, sleep apnea, vitamin D deficiency and reports ongoing issues with multiple neurological issues.  At her visit in December 2014 I suggested further workup. I ordered a brain MRI. Kayla [name] had a brain MRI without contrast on 12/20/2012: IMPRESSION:  Normal MRI brain (without). I ordered an EMG nerve conduction test which Kayla [name] had in December 2014 with normal findings. We called her with her test results at the time. I also ordered blood work him on blood work from 12/04/2012 showed normal B12, folate, RPR, ANA with reflex, normal CK, normal aldolase, normal TSH. I reviewed your office notes from 08/28/2015 as well as 09/12/2015 and also recent female and phone conversation records. Kayla [name] was in the emergency room on 07/08/2015 and diagnosed with community-acquired pneumonia. Kayla [name] presented to the emergency room on 05/26/2015 with neck pain. Kayla [name] had recent neck surgery with C4-5 ACDF in June 2017, under Dr. Newell Coral.  Kayla [name] was admitted to the hospital in May 2016 with left-sided weakness.  Workup included CT and CT angio head and neck, EEG, which was normal, and discharge diagnosis was adjustment disorder with mixed anxiety and depressed mood.  Kayla [name] was seen by Dr. Lieutenant Diego at [facility] neurology in January 2016. Kayla [name] saw Dr. Ivan Anchors at [facility]. Kayla [name] had a good report with her. Kayla [name] would like to see her back but it is difficult  to get an appointment at [facility] Kayla [name] says. I reviewed the office note from 11/07/2013 when Kayla [name] saw Dr. Ivan Anchors area there was concern for functional neurologic syndrome. Kayla [name] was referred to neuropsychology.   Kayla [name] has seen ophthalmologist, was told Kayla [name] has glaucoma, and that this may be due to an underlying neurological disorder.  Kayla [name] feels it is not emotional, grew up in an "alcoholic home". Kayla [name] has c/o fatigue, balance issues, has fallen, has twitching and trembling.  Kayla [name] has an appointment another ophthalmologist in November, Dr. Rudene Anda.  Kayla [name] reports breaking out in severe and diffuse sweats with minimal exertion. Kayla [name] denies night sweats. Kayla [name] had a hysterectomy and all 4 rectum he and his on hormone replacement.  Previously:  12/04/2012: 55 year old right-handed woman with an underlying medical history of low back pain, vitamin D deficiency, sleep apnea on BiPAP, smoker, hyperlipidemia, abdominal pain, degenerative cervical spine disease and low back pain who was previously diagnosed with ALS, but states, Kayla [name] was then told Kayla [name] does not have ALS. Unfortunately, I do not have any prior records from neurology to review. Kayla [name] used to see Dr. Weldon Inches out of [facility], who sent her to the ALS clinic in [place], where Kayla [name] was eventually told Kayla [name] had no ALS. Kayla [name] states her Sx dated back in 2006 with LE weakness, L>R. Sx became worse in 2008 and started having breathing and swallowing problems, had a feeding tube and was even in hospice care, was bed bound for about 18 months, then started improving. Kayla [name] also has neck pain, had  neck surgery some 13 years ago and has a pinched nerve. Kayla [name] had a neck MRI under Dr. Enid BaasVictor Fruend [place] [place] [place]. Kayla [name] goes to [facility] [place] [place] [facility] [place] and gets neck injections.  Kayla [name] had an extensive w/u in [place], including blood work, MRI brain, CSF studies, and EMG/NCV testing. Her saw a Landne[facility]romuscular expert in [place], who left. Prior to that Kayla [name] saw Dr. Alphonzo Dublinaress at  [facility], who told her Kayla [name] did not need to come back to see him. This is per her verbal report. 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  Kayla [name] states, Kayla [name] was told Kayla [name] was not a surgical candidate for her neck. Kayla [name] reports neck pain and weakness in her hands and L foot. Kayla [name] has had worsening weakness in the 2-3 months and has noted muscle wasting and reports fasciculations.  Of note Kayla [name] had a cervical spine MRI in August of this year: Solid fusion at C5-C6 without recurrent stenosis. Unchanged C4-C5 and C6-C7 adjacent segment disease. C4-C5 shows mild to moderate central stenosis and bilateral foraminal stenosis potentially affecting both C5 nerves.    Her Past Medical History Is Significant For: Past Medical History:  Diagnosis Date  . Anxiety   . Chronic kidney disease    pt stated "I have Stage 3 kidney disease"  . Complication of anesthesia    Pt stated "sometimes I have trouble waking up"  . Constipation due to pain medication   . DDD (degenerative disc disease), cervical   . Depression   . Depression with anxiety 07/15/2015  . Functional neurological symptom disorder with mixed symptoms 05/19/2012   2007  Started then was tripping up steps. Then was told Kayla [name] had MS then ALS now back to MS Had extreme sweating, fatigue. Left foot drop but has worked hard not to need AFO and is stronger now.     . G tube feedings (HCC)   . Glaucoma   . History of chicken pox 07/15/2015  . Hyperlipidemia   . Hyperlipidemia, mixed 07/25/2015  . Hilda BladesLou Gehrig disease [facility](HCC)   . Migraine    Takes Imitrex  . Neuromuscular disease (HCC)   . Osteoarthritis of back   . Pneumonia 07/15/2015  . Preventative health care 09/21/2015  . Primary lateral sclerosis   . RLL pneumonia (HCC) 07/15/2015  . Shortness of breath dyspnea    with exertion  . Sleep apnea 09/12/2015   H/o Bipap and VPAP use in past    Her Past Surgical History Is Significant For: Past Surgical History:  Procedure Laterality Date  . ABDOMINAL  HYSTERECTOMY     TAH b/l SPO, Dr Loreta AveWagner  . ANTERIOR CERVICAL DECOMP/DISCECTOMY FUSION N/A 06/19/2015   Procedure: Removal of Codman anterior cervical plate;   Anterior Cervcial Decompression Fusion Cervical Four-Five;  Surgeon: Shirlean Kellyobert Nudelman, MD;  Location: [facility]URO [facility];  Service: Neurosurgery;  Laterality: N/A;  left side approach  . APPENDECTOMY    . BREAST ENHANCEMENT SURGERY    . CHOLECYSTECTOMY    . COLONOSCOPY    . ECTOPIC PREGNANCY SURGERY     X 4 and miscarriage  . ESOPHAGOGASTRODUODENOSCOPY N/A 05/19/2012   Procedure: ESOPHAGOGASTRODUODENOSCOPY (EGD);  Surgeon: Theda BelfastPatrick D Hung, MD;  [facility];  Service: Endoscopy;  Laterality: N/A;  . NECK SURGERY    . PEG PLACEMENT      Her Family History Is Significant For: Family History  Problem Relation Age of Onset  . Hypertension Brother   . Alcohol abuse Brother   .  Alcohol abuse Mother   . Cancer Mother     pancreatic  . Heart disease Father   . Alcohol abuse Father   . Heart disease Paternal Uncle   . Alcohol abuse Paternal Uncle     Her Social History Is Significant For: Social History   Social History  . Marital status: Married    Spouse name: N/A  . Number of children: N/A  . Years of education: college   Occupational History  . None     Social History Main Topics  . Smoking status: Current Every Day Smoker    Packs/day: 0.50    Years: 30.00    Types: Cigarettes  . Smokeless tobacco: None  . Alcohol use Yes     Comment: occasional  . Drug use: No  . Sexual activity: Yes    Birth control/ protection: Surgical   Other Topics Concern  . None   Social History Narrative   Lives with boyfriend, widowed, then divorced, remarried 2017. No major dietary restrictions. 1/2 ppd, drinks a small amount socially    Her Allergies Are:  Allergies  Allergen Reactions  . Dilaudid [Hydromorphone Hcl] Itching    Can take with Benadryl  . Hydrocodone-Homatropine Nausea And Vomiting  . Codeine Nausea And  Vomiting and Rash    Patient hasn't had it Codeine Kayla [name] doesn't think Kayla [name] has an existing allergy  :   Her Current Medications Are:  Outpatient Encounter Prescriptions as of 10/16/2015  Medication Sig  . albuterol (PROVENTIL HFA;VENTOLIN HFA) 108 (90 Base) MCG/ACT inhaler Inhale 2 puffs into the lungs every 4 (four) hours as needed for wheezing or shortness of breath.  Marland Kitchen albuterol (PROVENTIL) (2.5 MG/3ML) 0.083% nebulizer solution Take 3 mLs (2.5 mg total) by nebulization every 2 (two) hours as needed for wheezing or shortness of breath.  Marland Kitchen aspirin 81 MG tablet Take 81 mg by mouth daily. Reported on 07/15/2015  . buPROPion (WELLBUTRIN XL) 300 MG 24 hr tablet Take 300 mg by mouth daily.  . clonazePAM (KLONOPIN) 0.5 MG tablet Take 1 tablet (0.5 mg total) by mouth 2 (two) times daily as needed for anxiety.  Marland Kitchen estradiol (ESTRACE) 1 MG tablet Take 1 mg by mouth daily.  . fenofibrate (TRICOR) 145 MG tablet Take 1 tablet (145 mg total) by mouth daily.  . fluticasone (FLONASE) 50 MCG/ACT nasal spray Place 2 sprays into both nostrils 2 (two) times daily.  . Multiple Vitamin (MULTI VITAMIN DAILY PO) Take 1 tablet by mouth daily.   . SUMAtriptan (IMITREX) 100 MG tablet Take 1 tablet (100 mg total) by mouth every 2 (two) hours as needed for migraine or headache. May repeat in 2 hours if headache persists or recurs.  . traZODone (DESYREL) 100 MG tablet Take 100-200 mg by mouth at bedtime.   Marland Kitchen tiZANidine (ZANAFLEX) 4 MG tablet Take 1 tablet (4 mg total) by mouth every 6 (six) hours as needed for muscle spasms. (Patient not taking: Reported on 10/16/2015)  . [DISCONTINUED] ciprofloxacin (CIPRO) 500 MG tablet Take 1 tablet (500 mg total) by mouth 2 (two) times daily. Take for 5 days.   No facility-administered encounter medications on file as of 10/16/2015.   :  Review of Systems:  Out of a complete 14 point review of systems, all are reviewed and negative with the exception of these symptoms as listed  below:  Review of Systems  Eyes:       Recent diagnosis of Glaucoma   Neurological:  Patient reports that Kayla [name] has trouble with muscle fatigue, increased sweating, double vision, numbness in large toe and thumb. Easily fatigued. Unbalanced.  Kayla [name] states that Kayla [name] was diagnosed at [facility] with Neurological Symptom disorder with lateral movement, but it is hard to get an appointment there. Kayla [name] states that Kayla [name] was also told that her diaphragm is only functioning at 60%.    Reports that Kayla [name] was diagnosed years ago with Primary Lateral Sclerosis but has since then has been told that it has resolved.     Objective:  Neurologic Exam  Physical Exam Physical Examination:   Vitals:   10/16/15 1003  BP: 98/62  Pulse: 78  Resp: 16    General Examination: The patient is a 55 y.o. female in no acute distress. Kayla [name] appears well-developed and well-nourished and well groomed. Kayla [name] is anxious and tearful intermittently.  HEENT: Normocephalic, atraumatic, pupils are equal, round and reactive to light and accommodation. Funduscopic exam is normal with sharp disc margins noted. Extraocular tracking is good without limitation to gaze excursion or nystagmus noted. Normal smooth pursuit is noted. Hearing is grossly intact. Face is symmetric with normal facial animation and normal facial sensation. Speech is clear with no dysarthria noted. There is no hypophonia. There is no lip, neck/head, jaw or voice tremor. Neck is supple with full range of passive and active motion. There are no carotid bruits on auscultation. Oropharynx exam reveals: mild mouth dryness, adequate dental hygiene and mild airway crowding, due to narrow airway. Mallampati is class II. Tongue protrudes centrally and palate elevates symmetrically.    Chest: Clear to auscultation without wheezing, rhonchi or crackles noted.  Heart: S1+S2+0, regular and normal without murmurs, rubs or gallops noted.   Abdomen: Soft, non-tender and non-distended  with normal bowel sounds appreciated on auscultation.  Extremities: There is no pitting edema in the distal lower extremities bilaterally. Pedal pulses are intact.  Skin: Warm and dry without trophic changes noted. There are no varicose veins.  Musculoskeletal: exam reveals no obvious joint deformities, tenderness or joint swelling or erythema.   Neurologically:  Mental status: The patient is awake, alert and oriented in all 4 spheres. Her memory, attention, language and knowledge are appropriate. There is no aphasia, agnosia, apraxia or anomia. Speech is clear with normal prosody and enunciation. Thought process is linear. Mood is anxious and sad and affect is constricted.  Cranial nerves are as described above under HEENT exam. In addition, shoulder shrug is normal with equal shoulder height noted. Motor exam: Normal bulk, and tone is noted. Strength testing is difficult to interpret. Kayla [name] has variable strength with some giveaway weakness noted on the L more than R. Variable effort. Kayla [name] has no resting tremor or postural tremor. Kayla [name] has no intention tremor but finger to nose is slow and deliberate. Heel to shin is unremarkable on the right and slow on the left. Reflexes are 2-3+ throughout, toes are downgoing. There are no abnormal involuntary movements, in particular, no athetoid movements, no myoclonus, no resting tremor, no fasciculations noted in her  not distal arms or legs, Kayla [name] is wearing short sleeves and shorts. No global or focal atrophy is noted. Sensory exam is intact to light touch, pinprick, vibration, temperature sense in the upper and lower extremities.  Gait, station and balance: Kayla [name] stands up with mild difficulty, reports right hip pain. Posture is appropriate for age.  Kayla [name] walks with a slight limp on the left. Tandem walk is difficult for her, Romberg shows sway. Assessment and Plan:  In summary, Kayla [name] is a 55 year old right-handed woman with an underlying medical history of  low back pain, neck pain, smoking, hyperlipidemia, sleep apnea, vitamin D deficiency who returns for evaluation of multiple neurological complaints. Kayla [name] has seen multiple neurologists over the course of years. 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 Kayla [name] reports fatigue, twitching, involuntary movements, balance problems, left-sided weakness. Exam is inconsistent.  Kayla [name] has had extensive workup in the pastand when I saw her in December 2014 we did extensive blood work and EMG and nerve conduction testing which was normal at the time. I offered her a brain MRI and repeat EMG testing of all 4 extremities. I explained to her that we can help rule out an underlying organic neurological problem but may not be able to roll in a neurological diagnosis. This has clearly been frustrating for the patient and unfortunately, I am not sure if I can be of any further help from here on onwards. Kayla [name] had seen Dr. Kary Kos at [facility] and would like to see her again. Kayla [name] says it is difficult to get an appointment. Kayla [name] is encouraged to call and make an appointment. In the interim, we will call her with her test results including brain MRI with and without contrast on 4 limb EMG. Kayla [name] had multiple questions which I answered to the best of my knowledge. I will see her back as needed.  Thank you very much for allowing me to participate in the care of this nice patient. If I can be of any further assistance to you please do not hesitate to call me at [phone].  Sincerely,   Huston Foley, MD, PhD

## 2015-11-06 ENCOUNTER — Encounter: Payer: Self-pay | Admitting: Family Medicine

## 2015-11-06 ENCOUNTER — Ambulatory Visit (INDEPENDENT_AMBULATORY_CARE_PROVIDER_SITE_OTHER): Payer: Medicare Other | Admitting: Family Medicine

## 2015-11-06 VITALS — BP 112/68 | HR 82 | Temp 97.9°F | Ht 63.0 in | Wt 137.4 lb

## 2015-11-06 DIAGNOSIS — G4730 Sleep apnea, unspecified: Secondary | ICD-10-CM

## 2015-11-06 DIAGNOSIS — R531 Weakness: Secondary | ICD-10-CM | POA: Diagnosis not present

## 2015-11-06 DIAGNOSIS — T7840XA Allergy, unspecified, initial encounter: Secondary | ICD-10-CM

## 2015-11-06 DIAGNOSIS — T7840XS Allergy, unspecified, sequela: Secondary | ICD-10-CM

## 2015-11-06 DIAGNOSIS — R519 Headache, unspecified: Secondary | ICD-10-CM

## 2015-11-06 DIAGNOSIS — R55 Syncope and collapse: Secondary | ICD-10-CM

## 2015-11-06 DIAGNOSIS — R51 Headache: Secondary | ICD-10-CM

## 2015-11-06 DIAGNOSIS — R739 Hyperglycemia, unspecified: Secondary | ICD-10-CM

## 2015-11-06 DIAGNOSIS — E782 Mixed hyperlipidemia: Secondary | ICD-10-CM | POA: Diagnosis not present

## 2015-11-06 DIAGNOSIS — J45909 Unspecified asthma, uncomplicated: Secondary | ICD-10-CM

## 2015-11-06 DIAGNOSIS — F447 Conversion disorder with mixed symptom presentation: Secondary | ICD-10-CM

## 2015-11-06 DIAGNOSIS — J4520 Mild intermittent asthma, uncomplicated: Secondary | ICD-10-CM

## 2015-11-06 DIAGNOSIS — G479 Sleep disorder, unspecified: Secondary | ICD-10-CM

## 2015-11-06 DIAGNOSIS — R0683 Snoring: Secondary | ICD-10-CM

## 2015-11-06 HISTORY — DX: Unspecified asthma, uncomplicated: J45.909

## 2015-11-06 HISTORY — DX: Allergy, unspecified, initial encounter: T78.40XA

## 2015-11-06 LAB — COMPREHENSIVE METABOLIC PANEL
ALBUMIN: 4.8 g/dL (ref 3.5–5.2)
ALK PHOS: 36 U/L — AB (ref 39–117)
ALT: 17 U/L (ref 0–35)
AST: 17 U/L (ref 0–37)
BUN: 18 mg/dL (ref 6–23)
CALCIUM: 9.7 mg/dL (ref 8.4–10.5)
CHLORIDE: 101 meq/L (ref 96–112)
CO2: 30 mEq/L (ref 19–32)
Creatinine, Ser: 1.14 mg/dL (ref 0.40–1.20)
GFR: 52.47 mL/min — AB (ref >60.00)
Glucose, Bld: 87 mg/dL (ref 70–99)
POTASSIUM: 3.5 meq/L (ref 3.5–5.1)
SODIUM: 138 meq/L (ref 135–145)
TOTAL PROTEIN: 7.3 g/dL (ref 6.0–8.3)
Total Bilirubin: 0.5 mg/dL (ref 0.2–1.2)

## 2015-11-06 LAB — LIPID PANEL
CHOLESTEROL: 181 mg/dL (ref 0–200)
HDL: 74.3 mg/dL (ref >39.00)
LDL CALC: 92 mg/dL (ref 0–99)
NonHDL: 107.04
TRIGLYCERIDES: 76 mg/dL (ref 0.0–149.0)
Total CHOL/HDL Ratio: 2
VLDL: 15.2 mg/dL (ref 0.0–40.0)

## 2015-11-06 LAB — CBC
HEMATOCRIT: 41.8 % (ref 36.0–46.0)
HEMOGLOBIN: 13.9 g/dL (ref 12.0–15.0)
MCHC: 33.3 g/dL (ref 30.0–36.0)
MCV: 88.9 fl (ref 78.0–100.0)
Platelets: 290 10*3/uL (ref 150.0–400.0)
RBC: 4.7 Mil/uL (ref 3.87–5.11)
RDW: 12.7 % (ref 11.5–15.5)
WBC: 7.9 10*3/uL (ref 4.0–10.5)

## 2015-11-06 LAB — HEMOGLOBIN A1C: HEMOGLOBIN A1C: 5.6 % (ref 4.6–6.5)

## 2015-11-06 LAB — TSH: TSH: 1.28 u[IU]/mL (ref 0.35–4.50)

## 2015-11-06 MED ORDER — MONTELUKAST SODIUM 10 MG PO TABS: 10.0000 mg | ORAL_TABLET | Freq: Every evening | ORAL | 3 refills | Status: DC | PRN

## 2015-11-06 NOTE — Assessment & Plan Note (Signed)
Worse in last 3 days, etiology unclear

## 2015-11-06 NOTE — Progress Notes (Signed)
Pre visit review using our clinic review tool, if applicable. No additional management support is needed unless otherwise documented below in the visit note. 

## 2015-11-06 NOTE — Assessment & Plan Note (Signed)
hgba1c acceptable, minimize simple carbs. Increase exercise as tolerated. Continue current meds 

## 2015-11-06 NOTE — Patient Instructions (Addendum)
Please send us a My Chart note with the name your opthamologist in town Asthma, Adult Asthma is a recurring condition in which the airways tighten and narrow. Asthma can make it difficult to breathe. It can cause coughing, wheezing, and shortness of breath. Asthma episodes, also called asthma attacks, range from minor to life-threatening. Asthma cannot be cured, but medicines and lifestyle changes can help control it. CAUSES Asthma is believed to be caused by inherited (genetic) and environmental factors, but its exact cause is unknown. Asthma may be triggered by allergens, lung infections, or irritants in the air. Asthma triggers are different for each person. Common triggers include:   Animal dander.  Dust mites.  Cockroaches.  Pollen from trees or grass.  Mold.  Smoke.  Air pollutants such as dust, household cleaners, hair sprays, aerosol sprays, paint fumes, strong chemicals, or strong odors.  Cold air, weather changes, and winds (which increase molds and pollens in the air).  Strong emotional expressions such as crying or laughing hard.  Stress.  Certain medicines (such as aspirin) or types of drugs (such as beta-blockers).  Sulfites in foods and drinks. Foods and drinks that may contain sulfites include dried fruit, potato chips, and sparkling grape juice.  Infections or inflammatory conditions such as the flu, a cold, or an inflammation of the nasal membranes (rhinitis).  Gastroesophageal reflux disease (GERD).  Exercise or strenuous activity. SYMPTOMS Symptoms may occur immediately after asthma is triggered or many hours later. Symptoms include:  Wheezing.  Excessive nighttime or early morning coughing.  Frequent or severe coughing with a common cold.  Chest tightness.  Shortness of breath. DIAGNOSIS  The diagnosis of asthma is made by a review of your medical history and a physical exam. Tests may also be performed. These may include:  Lung function studies.  These tests show how much air you breathe in and out.  Allergy tests.  Imaging tests such as X-rays. TREATMENT  Asthma cannot be cured, but it can usually be controlled. Treatment involves identifying and avoiding your asthma triggers. It also involves medicines. There are 2 classes of medicine used for asthma treatment:   Controller medicines. These prevent asthma symptoms from occurring. They are usually taken every day.  Reliever or rescue medicines. These quickly relieve asthma symptoms. They are used as needed and provide short-term relief. Your health care provider will help you create an asthma action plan. An asthma action plan is a written plan for managing and treating your asthma attacks. It includes a list of your asthma triggers and how they may be avoided. It also includes information on when medicines should be taken and when their dosage should be changed. An action plan may also involve the use of a device called a peak flow meter. A peak flow meter measures how well the lungs are working. It helps you monitor your condition. HOME CARE INSTRUCTIONS   Take medicines only as directed by your health care provider. Speak with your health care provider if you have questions about how or when to take the medicines.  Use a peak flow meter as directed by your health care provider. Record and keep track of readings.  Understand and use the action plan to help minimize or stop an asthma attack without needing to seek medical care.  Control your home environment in the following ways to help prevent asthma attacks:  Do not smoke. Avoid being exposed to secondhand smoke.  Change your heating and air conditioning filter regularly.  Limit your  use of fireplaces and wood stoves.  Get rid of pests (such as roaches and mice) and their droppings.  Throw away plants if you see mold on them.  Clean your floors and dust regularly. Use unscented cleaning products.  Try to have someone  else vacuum for you regularly. Stay out of rooms while they are being vacuumed and for a short while afterward. If you vacuum, use a dust mask from a hardware store, a double-layered or microfilter vacuum cleaner bag, or a vacuum cleaner with a HEPA filter.  Replace carpet with wood, tile, or vinyl flooring. Carpet can trap dander and dust.  Use allergy-proof pillows, mattress covers, and box spring covers.  Wash bed sheets and blankets every week in hot water and dry them in a dryer.  Use blankets that are made of polyester or cotton.  Clean bathrooms and kitchens with bleach. If possible, have someone repaint the walls in these rooms with mold-resistant paint. Keep out of the rooms that are being cleaned and painted.  Wash hands frequently. SEEK MEDICAL CARE IF:   You have wheezing, shortness of breath, or a cough even if taking medicine to prevent attacks.  The colored mucus you cough up (sputum) is thicker than usual.  Your sputum changes from clear or white to yellow, green, gray, or bloody.  You have any problems that may be related to the medicines you are taking (such as a rash, itching, swelling, or trouble breathing).  You are using a reliever medicine more than 2-3 times per week.  Your peak flow is still at 50-79% of your personal best after following your action plan for 1 hour.  You have a fever. SEEK IMMEDIATE MEDICAL CARE IF:   You seem to be getting worse and are unresponsive to treatment during an asthma attack.  You are short of breath even at rest.  You get short of breath when doing very little physical activity.  You have difficulty eating, drinking, or talking due to asthma symptoms.  You develop chest pain.  You develop a fast heartbeat.  You have a bluish color to your lips or fingernails.  You are light-headed, dizzy, or faint.  Your peak flow is less than 50% of your personal best.   This information is not intended to replace advice given to  you by your health care provider. Make sure you discuss any questions you have with your health care provider.   Document Released: 12/21/2004 Document Revised: 09/11/2014 Document Reviewed: 07/20/2012 Elsevier Interactive Patient Education [employer]2016 [employer] Inc.

## 2015-11-06 NOTE — Assessment & Plan Note (Signed)
A couple of weeks ago had a presyncopal episode, no true collapse with syncope for roughly 1 year.

## 2015-11-06 NOTE — Assessment & Plan Note (Signed)
Encouraged heart healthy diet, increase exercise, avoid trans fats, consider a krill oil cap daily 

## 2015-11-06 NOTE — Assessment & Plan Note (Signed)
Encouraged increased hydration, 64 ounces of clear fluids daily. Minimize alcohol and caffeine. Eat small frequent meals with lean proteins and complex carbs. Avoid high and low blood sugars. Get adequate sleep, 7-8 hours a night. Needs exercise daily preferably in the morning.  

## 2015-11-09 NOTE — Assessment & Plan Note (Signed)
H/o Vpap and bipap in past. Now with restless sleep, fatigue and SOB with exertion. Will refer to pulmonology for further evaluation.

## 2015-11-09 NOTE — Progress Notes (Signed)
Patient ID: Kayla [name], female   DOB: September 09, 1960, 55 y.o.   MRN: [id number]   Subjective:    Patient ID: Kayla [name], female    DOB: September 09, 1960, 55 y.o.   MRN: [id number]  Chief Complaint  Patient presents with  . Follow-up    HPI Patient is in today for follow up. She continues to struggle with numerous concerns. She has left sided weakness which is worse with fatigue. No falls or recurrent injury since last visit. Does endorse a cough and SOB. Non productive. No fevers. Is noting some peripheral vision loss which has been worked up by [facility], not worsening. Denies CP/palp/SOB/HA/congestion/fevers/GI or GU c/o. Taking meds as prescribed  Past Medical History:  Diagnosis Date  . Allergic symptoms 11/06/2015  . Anxiety   . Asthma 11/06/2015  . Chronic kidney disease    pt stated "I have Stage 3 kidney disease"  . Complication of anesthesia    Pt stated "sometimes I have trouble waking up"  . Constipation due to pain medication   . DDD (degenerative disc disease), cervical   . Depression   . Depression with anxiety 07/15/2015  . Functional neurological symptom disorder with mixed symptoms 05/19/2012   2007  Started then was tripping up steps. 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 Left foot drop but has worked hard not to need AFO and is stronger now.     . G tube feedings (HCC)   . Glaucoma   . History of chicken pox 07/15/2015  . Hyperlipidemia   . Hyperlipidemia, mixed 07/25/2015  . Hilda BladesLou Gehrig disease [facility](HCC)   . Migraine    Takes Imitrex  . Neuromuscular disease (HCC)   . Osteoarthritis of back   . Pneumonia 07/15/2015  . Preventative health care 09/21/2015  . Primary lateral sclerosis   . RLL pneumonia (HCC) 07/15/2015  . Shortness of breath dyspnea    with exertion  . Sleep apnea 09/12/2015   H/o Bipap and VPAP use in past    Past Surgical History:  Procedure Laterality Date  . ABDOMINAL HYSTERECTOMY     TAH b/l SPO, Dr Loreta AveWagner  .  ANTERIOR CERVICAL DECOMP/DISCECTOMY FUSION N/A 06/19/2015   Procedure: Removal of Codman anterior cervical plate;   Anterior Cervcial Decompression Fusion Cervical Four-Five;  Surgeon: Shirlean Kellyobert Nudelman, MD;  Location: [facility];  Service: Neurosurgery;  Laterality: N/A;  left side approach  . APPENDECTOMY    . BREAST ENHANCEMENT SURGERY    . CHOLECYSTECTOMY    . COLONOSCOPY    . ECTOPIC PREGNANCY SURGERY     X 4 and miscarriage  . ESOPHAGOGASTRODUODENOSCOPY N/A 05/19/2012   Procedure: ESOPHAGOGASTRODUODENOSCOPY (EGD);  Surgeon: Theda BelfastPatrick D Hung, MD;  [facility];  Service: Endoscopy;  Laterality: N/A;  . NECK SURGERY    . PEG PLACEMENT      Family History  Problem Relation Age of Onset  . Hypertension Brother   . Alcohol abuse Brother   . Alcohol abuse Mother   . Cancer Mother     pancreatic  . Heart disease Father   . Alcohol abuse Father   . Heart disease Paternal Uncle   . Alcohol abuse Paternal Uncle     Social History   Social History  . Marital status: Married    Spouse name: N/A  . Number of children: N/A  . Years of education: college   Occupational History  . None     Social History  Main Topics  . Smoking status: Current Every Day Smoker    Packs/day: 0.50    Years: 30.00    Types: Cigarettes  . Smokeless tobacco: Not on file  . Alcohol use Yes     Comment: occasional  . Drug use: No  . Sexual activity: Yes    Birth control/ protection: Surgical   Other Topics Concern  . Not on file   Social History Narrative   Lives with boyfriend, widowed, then divorced, remarried 2017. No major dietary restrictions. 1/2 ppd, drinks a small amount socially    Outpatient Medications Prior to Visit  Medication Sig Dispense Refill  . albuterol (PROVENTIL HFA;VENTOLIN HFA) 108 (90 Base) MCG/ACT inhaler Inhale 2 puffs into the lungs every 4 (four) hours as needed for wheezing or shortness of breath. 1 Inhaler 1  . albuterol (PROVENTIL) (2.5 MG/3ML) 0.083%  nebulizer solution Take 3 mLs (2.5 mg total) by nebulization every 2 (two) hours as needed for wheezing or shortness of breath. 75 mL 1  . aspirin 81 MG tablet Take 81 mg by mouth daily. Reported on 07/15/2015    . buPROPion (WELLBUTRIN XL) 300 MG 24 hr tablet Take 300 mg by mouth daily. Taking 150 mg wellbutrin    . clonazePAM (KLONOPIN) 0.5 MG tablet Take 1 tablet (0.5 mg total) by mouth 2 (two) times daily as needed for anxiety. 60 tablet 1  . estradiol (ESTRACE) 1 MG tablet Take 1 mg by mouth daily.    . fenofibrate (TRICOR) 145 MG tablet Take 1 tablet (145 mg total) by mouth daily. 30 tablet 3  . fluticasone (FLONASE) 50 MCG/ACT nasal spray Place 2 sprays into both nostrils 2 (two) times daily.    . Multiple Vitamin (MULTI VITAMIN DAILY PO) Take 1 tablet by mouth daily.     . SUMAtriptan (IMITREX) 100 MG tablet Take 1 tablet (100 mg total) by mouth every 2 (two) hours as needed for migraine or headache. May repeat in 2 hours if headache persists or recurs. 15 tablet 11  . tiZANidine (ZANAFLEX) 4 MG tablet Take 1 tablet (4 mg total) by mouth every 6 (six) hours as needed for muscle spasms. 90 tablet 1  . traZODone (DESYREL) 100 MG tablet Take 100-200 mg by mouth at bedtime.      No facility-administered medications prior to visit.     Allergies  Allergen Reactions  . Dilaudid [Hydromorphone Hcl] Itching    Can take with Benadryl  . Hydrocodone-Homatropine Nausea And Vomiting  . Codeine Nausea And Vomiting and Rash    Patient hasn't had it Codeine She doesn't think she has an existing allergy    Review of Systems  Constitutional: Positive for malaise/fatigue. Negative for fever.  HENT: Negative for congestion.   Eyes: Positive for blurred vision.  Respiratory: Positive for shortness of breath. Negative for cough and sputum production.   Cardiovascular: Negative for chest pain, palpitations and leg swelling.  Gastrointestinal: Negative for abdominal pain, blood in stool and nausea.    Genitourinary: Negative for dysuria and frequency.  Musculoskeletal: Negative for falls.  Skin: Negative for rash.  Neurological: Positive for focal weakness, weakness and headaches. Negative for dizziness and loss of consciousness.  Endo/Heme/Allergies: Negative for environmental allergies.  Psychiatric/Behavioral: Negative for depression. The patient is not nervous/anxious.        Objective:    Physical Exam  Constitutional: She is oriented to person, place, and time. She appears well-developed and well-nourished. No distress.  HENT:  Head: Normocephalic and atraumatic.  Nose: Nose normal.  Eyes: Right eye exhibits no discharge. Left eye exhibits no discharge.  Neck: Normal range of motion. Neck supple.  Cardiovascular: Normal rate and regular rhythm.   No murmur heard. Pulmonary/Chest: Effort normal and breath sounds normal.  Abdominal: Soft. Bowel sounds are normal. There is no tenderness.  Musculoskeletal: She exhibits no edema.  Neurological: She is alert and oriented to person, place, and time.  Strength 5/5 RLE, 4/5 LLE  Skin: Skin is warm and dry.  Psychiatric: She has a normal mood and affect.  Nursing note and vitals reviewed.   BP 112/68 (BP Location: Left Arm, Patient Position: Sitting, Cuff Size: Normal)   Pulse 82   Temp 97.9 F (36.6 C) (Oral)   Ht 5\' 3"  (1.6 m)   Wt 137 lb 6 oz (62.3 kg)   SpO2 96%   BMI 24.33 kg/m  Wt Readings from Last 3 Encounters:  11/06/15 137 lb 6 oz (62.3 kg)  10/16/15 141 lb (64 kg)  09/12/15 140 lb 6 oz (63.7 kg)     Lab Results  Component Value Date   WBC 7.9 11/06/2015   HGB 13.9 11/06/2015   HCT 41.8 11/06/2015   PLT 290.0 11/06/2015   GLUCOSE 87 11/06/2015   CHOL 181 11/06/2015   TRIG 76.0 11/06/2015   HDL 74.30 11/06/2015   LDLCALC 92 11/06/2015   ALT 17 11/06/2015   AST 17 11/06/2015   NA 138 11/06/2015   K 3.5 11/06/2015   CL 101 11/06/2015   CREATININE 1.14 11/06/2015   BUN 18 11/06/2015   CO2 30  11/06/2015   TSH 1.28 11/06/2015   HGBA1C 5.6 11/06/2015    Lab Results  Component Value Date   TSH 1.28 11/06/2015   Lab Results  Component Value Date   WBC 7.9 11/06/2015   HGB 13.9 11/06/2015   HCT 41.8 11/06/2015   MCV 88.9 11/06/2015   PLT 290.0 11/06/2015   Lab Results  Component Value Date   NA 138 11/06/2015   K 3.5 11/06/2015   CO2 30 11/06/2015   GLUCOSE 87 11/06/2015   BUN 18 11/06/2015   CREATININE 1.14 11/06/2015   BILITOT 0.5 11/06/2015   ALKPHOS 36 (L) 11/06/2015   AST 17 11/06/2015   ALT 17 11/06/2015   PROT 7.3 11/06/2015   ALBUMIN 4.8 11/06/2015   CALCIUM 9.7 11/06/2015   ANIONGAP 12 06/19/2015   GFR 52.47 (L) 11/06/2015   Lab Results  Component Value Date   CHOL 181 11/06/2015   Lab Results  Component Value Date   HDL 74.30 11/06/2015   Lab Results  Component Value Date   LDLCALC 92 11/06/2015   Lab Results  Component Value Date   TRIG 76.0 11/06/2015   Lab Results  Component Value Date   CHOLHDL 2 11/06/2015   Lab Results  Component Value Date   HGBA1C 5.6 11/06/2015       Assessment & Plan:   Problem List Items Addressed This Visit    Functional neurological symptom disorder with mixed symptoms    2007  Started then was tripping up steps. Was initially treated in [place] and was even placed in Hospice after respiratory failure in 2010. Then was told she had MS then ALS vs PLS now Functional neurological disorder Had extreme sweating, fatigue. Left foot drop but has worked hard not to need AFO and is stronger now.  She has had an extensive recovering but still has weakness on left. Unclear etiology of illness in  the end. Will refer to neurology for further consideration.       Left-sided weakness    Worse in last 3 days, etiology unclear      Relevant Orders   CBC (Completed)   Comprehensive metabolic panel (Completed)   Headache    Encouraged increased hydration, 64 ounces of clear fluids daily. Minimize alcohol and  caffeine. Eat small frequent meals with lean proteins and complex carbs. Avoid high and low blood sugars. Get adequate sleep, 7-8 hours a night. Needs exercise daily preferably in the morning.      Relevant Orders   TSH (Completed)   Hyperglycemia    hgba1c acceptable, minimize simple carbs. Increase exercise as tolerated. Continue current meds.       Relevant Orders   Hemoglobin A1c (Completed)   Comprehensive metabolic panel (Completed)   Hyperlipidemia, mixed - Primary    Encouraged heart healthy diet, increase exercise, avoid trans fats, consider a krill oil cap daily      Relevant Orders   Lipid panel (Completed)   Sleep apnea    H/o Vpap and bipap in past. Now with restless sleep, fatigue and SOB with exertion. Will refer to pulmonology for further evaluation.      Syncope and collapse    A couple of weeks ago had a presyncopal episode, no true collapse with syncope for roughly 1 year.       Relevant Orders   CBC (Completed)   Comprehensive metabolic panel (Completed)   Asthma   Relevant Medications   montelukast (SINGULAIR) 10 MG tablet   Allergic symptoms    Other Visit Diagnoses    Snoring       Relevant Orders   Ambulatory referral to Pulmonology   Restless sleeper       Relevant Orders   Ambulatory referral to Pulmonology      I am having [name] start on montelukast. I am also having her maintain her buPROPion, traZODone, estradiol, Multiple Vitamin (MULTI VITAMIN DAILY PO), aspirin, fluticasone, albuterol, albuterol, fenofibrate, tiZANidine, SUMAtriptan, and clonazePAM.  Meds ordered this encounter  Medications  . montelukast (SINGULAIR) 10 MG tablet    Sig: Take 1 tablet (10 mg total) by mouth at bedtime as needed.    Dispense:  30 tablet    Refill:  3     Danise Edge, MD

## 2015-11-09 NOTE — Assessment & Plan Note (Signed)
2007  Started then was tripping up steps. Was initially treated in [place] and was even placed in Hospice after respiratory failure in 2010. Then was told she had MS then ALS vs PLS now Functional neurological disorder Had extreme sweating, fatigue. Left foot drop but has worked hard not to need AFO and is stronger now.  She has had an extensive recovering but still has weakness on left. Unclear etiology of illness in the end. Will refer to neurology for further consideration.

## 2015-11-17 ENCOUNTER — Other Ambulatory Visit: Payer: Self-pay | Admitting: Neurology

## 2015-11-19 ENCOUNTER — Other Ambulatory Visit: Payer: Self-pay | Admitting: Neurology

## 2015-11-20 ENCOUNTER — Ambulatory Visit
Admission: RE | Admit: 2015-11-20 | Discharge: 2015-11-20 | Disposition: A | Discharge status: Home / Self Care | Payer: Medicare Other | Source: Ambulatory Visit | Attending: Neurology | Admitting: Neurology

## 2015-11-20 ENCOUNTER — Ambulatory Visit (INDEPENDENT_AMBULATORY_CARE_PROVIDER_SITE_OTHER): Payer: Medicare Other | Admitting: Diagnostic Neuroimaging

## 2015-11-20 ENCOUNTER — Encounter (INDEPENDENT_AMBULATORY_CARE_PROVIDER_SITE_OTHER): Payer: Self-pay | Admitting: Diagnostic Neuroimaging

## 2015-11-20 DIAGNOSIS — R259 Unspecified abnormal involuntary movements: Secondary | ICD-10-CM

## 2015-11-20 DIAGNOSIS — R531 Weakness: Secondary | ICD-10-CM | POA: Diagnosis not present

## 2015-11-20 DIAGNOSIS — R252 Cramp and spasm: Secondary | ICD-10-CM

## 2015-11-20 DIAGNOSIS — R2689 Other abnormalities of gait and mobility: Secondary | ICD-10-CM

## 2015-11-20 DIAGNOSIS — R5383 Other fatigue: Secondary | ICD-10-CM

## 2015-11-20 DIAGNOSIS — Z0289 Encounter for other administrative examinations: Secondary | ICD-10-CM

## 2015-11-20 MED ORDER — GADOBENATE DIMEGLUMINE 529 MG/ML IV SOLN
12.0000 mL | Freq: Once | INTRAVENOUS | Status: AC | PRN
Start: 2015-11-20 — End: 2015-11-20
  Administered 2015-11-20: 12 mL via INTRAVENOUS

## 2015-11-20 NOTE — Procedures (Signed)
   [facility]  NCS (NERVE CONDUCTION STUDY) WITH EMG (ELECTROMYOGRAPHY) REPORT   STUDY DATE: 11/20/15 PATIENT NAME: Kayla [name] DOB: 12/09/1960 MRN: [id number]  ORDERING CLINICIAN: Huston FoleySaima Athar, MD PhD   TECHNOLOGIST: Gearldine ShownLorraine Jones  ELECTROMYOGRAPHER: Glenford BayleyVikram R. Anaijah Augsburger, MD  CLINICAL INFORMATION: 55 year old female with left sided weakness.  FINDINGS: NERVE CONDUCTION STUDY: Left median, left ulnar, bilateral peroneal and bilateral tibial motor responses are normal. Left median and left ulnar F wave latencies are normal. Bilateral H reflex responses are normal.  Left median, left ulnar, bilateral peroneal sensory responses are normal.   NEEDLE ELECTROMYOGRAPHY: Needle examination of left upper and left lower extremities (deltoid, biceps, triceps, flexor carpi radialis, first dorsal interosseous, vastus medialis, tibialis anterior, gastrocnemius) shows no abnormal spontaneous activity at rest and normal motor unit recruitment on exertion.   IMPRESSION:  This is a normal study. No electrodiagnostic evidence of myopathy or large fiber neuropathy at this time.    INTERPRETING PHYSICIAN:  Suanne MarkerVIKRAM R. Emitt Maglione, MD Certified in Neurology, Neurophysiology and Neuroimaging  [facility] [address], [address] [place], [place] [address] [phone]

## 2015-11-24 ENCOUNTER — Other Ambulatory Visit: Payer: Self-pay | Admitting: Family Medicine

## 2015-11-24 NOTE — Telephone Encounter (Signed)
Request for Diflucan denied, not an active medication of patient; 'pt needs to contact provider first'/SLS 11/20

## 2015-11-25 ENCOUNTER — Telehealth: Payer: Self-pay

## 2015-11-25 NOTE — Telephone Encounter (Signed)
I spoke to patient and she is aware of results below and is able to see on MyChart.

## 2015-11-25 NOTE — Telephone Encounter (Signed)
-----   Message from Huston FoleySaima Athar, MD sent at 11/25/2015  7:55 AM EST ----- Please call and advise the patient that the recent scan we did was within normal limits. We did a brain MRI with and without contrast, which showed normal findings. In particular, there were no acute findings, such as a stroke, or mass or blood products.  She does have signs of acute sinusitis and signs of chr sinus d/s. If she has symptoms of sinus drainage, congestion, sinus type headaches (ie pressure), she should talk to her PCP or go to UC.   No other action is required on this test at this time. Please remind patient to keep any upcoming appointments or tests and to call us with any interim questions, concerns, problems or updates. Thanks,  Huston FoleySaima Athar, MD, PhD

## 2015-11-25 NOTE — Telephone Encounter (Signed)
-----   Message from Huston FoleySaima Athar, MD sent at 11/25/2015  7:55 AM EST ----- Please call and advise the patient that the recent EMG and nerve conduction velocity test, which is the electrical nerve and muscle test we we performed, was reported as within normal limits. We checked for abnormal electrical discharges in the muscles or nerves and the report suggested normal findings. No further action is required on this test at this time. Please remind patient to keep any upcoming appointments or tests and to call us with any interim questions, concerns, problems or updates. Thanks,  Huston FoleySaima Athar, MD, PhD

## 2015-11-25 NOTE — Progress Notes (Signed)
Please call and advise the patient that the recent EMG and nerve conduction velocity test, which is the electrical nerve and muscle test we we performed, was reported as within normal limits. We checked for abnormal electrical discharges in the muscles or nerves and the report suggested normal findings. No further action is required on this test at this time. Please remind patient to keep any upcoming appointments or tests and to call us with any interim questions, concerns, problems or updates. Thanks,  Lashya Passe, MD, PhD 

## 2015-11-25 NOTE — Progress Notes (Signed)
Please call and advise the patient that the recent scan we did was within normal limits. We did a brain MRI with and without contrast, which showed normal findings. In particular, there were no acute findings, such as a stroke, or mass or blood products.  She does have signs of acute sinusitis and signs of chr sinus d/s. If she has symptoms of sinus drainage, congestion, sinus type headaches (ie pressure), she should talk to her PCP or go to UC.   No other action is required on this test at this time. Please remind patient to keep any upcoming appointments or tests and to call us with any interim questions, concerns, problems or updates. Thanks,  Huston FoleySaima Anelly Samarin, MD, PhD

## 2015-12-01 ENCOUNTER — Telehealth: Payer: Self-pay | Admitting: Medical

## 2015-12-01 ENCOUNTER — Encounter: Payer: Self-pay | Admitting: Medical

## 2015-12-01 ENCOUNTER — Ambulatory Visit (INDEPENDENT_AMBULATORY_CARE_PROVIDER_SITE_OTHER): Payer: Medicare Other | Admitting: Medical

## 2015-12-01 ENCOUNTER — Ambulatory Visit (HOSPITAL_BASED_OUTPATIENT_CLINIC_OR_DEPARTMENT_OTHER)
Admission: RE | Admit: 2015-12-01 | Discharge: 2015-12-01 | Disposition: A | Discharge status: Home / Self Care | Payer: Medicare Other | Source: Ambulatory Visit | Attending: Medical | Admitting: Medical

## 2015-12-01 VITALS — BP 110/68 | HR 66 | Temp 98.1°F | Ht 63.0 in | Wt 133.8 lb

## 2015-12-01 DIAGNOSIS — R062 Wheezing: Secondary | ICD-10-CM | POA: Diagnosis not present

## 2015-12-01 DIAGNOSIS — R059 Cough, unspecified: Secondary | ICD-10-CM

## 2015-12-01 DIAGNOSIS — J209 Acute bronchitis, unspecified: Secondary | ICD-10-CM | POA: Insufficient documentation

## 2015-12-01 DIAGNOSIS — R5383 Other fatigue: Secondary | ICD-10-CM

## 2015-12-01 DIAGNOSIS — R05 Cough: Secondary | ICD-10-CM

## 2015-12-01 DIAGNOSIS — J90 Pleural effusion, not elsewhere classified: Secondary | ICD-10-CM

## 2015-12-01 LAB — CBC WITH DIFFERENTIAL/PLATELET
Basophils Absolute: 0 10*3/uL (ref 0.0–0.1)
Basophils Relative: 0.5 % (ref 0.0–3.0)
EOS PCT: 2.3 % (ref 0.0–5.0)
Eosinophils Absolute: 0.2 10*3/uL (ref 0.0–0.7)
HCT: 43.8 % (ref 36.0–46.0)
Hemoglobin: 14.8 g/dL (ref 12.0–15.0)
LYMPHS ABS: 1.8 10*3/uL (ref 0.7–4.0)
Lymphocytes Relative: 22.6 % (ref 12.0–46.0)
MCHC: 33.7 g/dL (ref 30.0–36.0)
MCV: 88 fl (ref 78.0–100.0)
MONO ABS: 0.6 10*3/uL (ref 0.1–1.0)
Monocytes Relative: 7.2 % (ref 3.0–12.0)
NEUTROS PCT: 67.4 % (ref 43.0–77.0)
Neutro Abs: 5.4 10*3/uL (ref 1.4–7.7)
Platelets: 338 10*3/uL (ref 150.0–400.0)
RBC: 4.98 Mil/uL (ref 3.87–5.11)
RDW: 12.9 % (ref 11.5–15.5)
WBC: 8 10*3/uL (ref 4.0–10.5)

## 2015-12-01 MED ORDER — KETOROLAC TROMETHAMINE 60 MG/2ML IM SOLN
60.0000 mg | Freq: Once | INTRAMUSCULAR | Status: AC
  Administered 2015-12-01: 60 mg via INTRAMUSCULAR

## 2015-12-01 MED ORDER — FLUTICASONE PROPIONATE 50 MCG/ACT NA SUSP: 2.0000 | Freq: Every day | NASAL | 1 refills | Status: DC

## 2015-12-01 MED ORDER — CEFTRIAXONE SODIUM 1 G IJ SOLR
1.0000 g | Freq: Once | INTRAMUSCULAR | Status: AC
  Administered 2015-12-01: 1 g via INTRAMUSCULAR

## 2015-12-01 MED ORDER — ALBUTEROL SULFATE (2.5 MG/3ML) 0.083% IN NEBU: 2.5000 mg | INHALATION_SOLUTION | Freq: Four times a day (QID) | RESPIRATORY_TRACT | 1 refills | Status: AC | PRN

## 2015-12-01 MED ORDER — PREDNISONE 10 MG PO TABS: ORAL_TABLET | ORAL | 0 refills | Status: DC

## 2015-12-01 MED ORDER — AZITHROMYCIN 250 MG PO TABS: ORAL_TABLET | ORAL | 0 refills | Status: DC

## 2015-12-01 MED ORDER — BENZONATATE 100 MG PO CAPS: 100.0000 mg | ORAL_CAPSULE | Freq: Three times a day (TID) | ORAL | 0 refills | Status: DC | PRN

## 2015-12-01 NOTE — Progress Notes (Signed)
Pre visit review using our clinic review tool, if applicable. No additional management support is needed unless otherwise documented below in the visit note. 

## 2015-12-01 NOTE — Patient Instructions (Addendum)
You appear to have bronchitis and sinusitis. Rest hydrate and tylenol for fever. I am prescribing cough medicine benzonatate , and antibiotic(but will wait until chest xray back before deciding on oral antibiotic). For your nasal congestion rx flonase.   For wheezing taper prednisone and refill you neb solution.   For your headache which may be sinus infection related toradol 60 mg im. If HA with neurologic signs or symptoms then ED evaluation.  Also rocephin 1 gram given in office today.(pt expresses concern for pneumonia)  Will call you later on chest xray findings and lab work.  Follow up in 7 days or as needed.  Post xray review decided to give azithromycin antibiotic.

## 2015-12-01 NOTE — Telephone Encounter (Signed)
Sent in azithromycin to her pharmacy. Start the azithromycin antibiotic.

## 2015-12-01 NOTE — Telephone Encounter (Signed)
Future chest xray placed. To be done in 10 days.

## 2015-12-01 NOTE — Progress Notes (Signed)
Subjective:    Patient ID: Kayla HaggisLisa Mabe, female    DOB: [birth year], 55 y.o.   MRN: [id number]  HPI  Pt in stating has nasal and chest congestion. She states 3 weeks of this recently. Not getting better. Pt has day and night cough. Recent HA for 5 days. She states history of migraine headaches.(Pt had MRI for work up some weakness of her left upper and lower ext).  Pt has been wheezing on and off for 3 weeks. Pt has been using her neb machine one treatment a day.  Pt is coughing up colored mucous.    Review of Systems  Constitutional: Positive for fatigue. Negative for chills.  HENT: Positive for congestion, sinus pain and sinus pressure. Negative for sore throat.   Respiratory: Positive for cough. Negative for chest tightness, shortness of breath and wheezing.   Cardiovascular: Negative for chest pain and palpitations.  Gastrointestinal: Negative for abdominal pain.  Musculoskeletal: Negative for back pain.  Skin: Negative for rash.  Neurological: Positive for headaches. Negative for dizziness, tremors, syncope, weakness and light-headedness.       Some description of sinus pain.  Hematological: Negative for adenopathy. Does not bruise/bleed easily.  Psychiatric/Behavioral: Negative for behavioral problems and confusion.    Past Medical History:  Diagnosis Date  . Allergic symptoms 11/06/2015  . Anxiety   . Asthma 11/06/2015  . Chronic kidney disease    pt stated "I have Stage 3 kidney disease"  . Complication of anesthesia    Pt stated "sometimes I have trouble waking up"  . Constipation due to pain medication   . DDD (degenerative disc disease), cervical   . Depression   . Depression with anxiety 07/15/2015  . Functional neurological symptom disorder with mixed symptoms 05/19/2012   2007  Started then was tripping up steps. 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 Left foot drop but has worked hard not to need AFO and is stronger now.       . G tube feedings (HCC)   . Glaucoma   . History of chicken pox 07/15/2015  . Hyperlipidemia   . Hyperlipidemia, mixed 07/25/2015  . Hilda BladesLou Gehrig disease [facility](HCC)   . Migraine    Takes Imitrex  . Neuromuscular disease (HCC)   . Osteoarthritis of back   . Pneumonia 07/15/2015  . Preventative health care 09/21/2015  . Primary lateral sclerosis   . RLL pneumonia (HCC) 07/15/2015  . Shortness of breath dyspnea    with exertion  . Sleep apnea 09/12/2015   H/o Bipap and VPAP use in past     Social History   Social History  . Marital status: Married    Spouse name: N/A  . Number of children: N/A  . Years of education: college   Occupational History  . None     Social History Main Topics  . Smoking status: Current Every Day Smoker    Packs/day: 0.50    Years: 30.00    Types: Cigarettes  . Smokeless tobacco: Not on file  . Alcohol use Yes     Comment: occasional  . Drug use: No  . Sexual activity: Yes    Birth control/ protection: Surgical   Other Topics Concern  . Not on file   Social History Narrative   Lives with boyfriend, widowed, then divorced, remarried 2017. No major dietary restrictions. 1/2 ppd, drinks a small amount socially    Past Surgical History:  Procedure Laterality  Date  . ABDOMINAL HYSTERECTOMY     TAH b/l SPO, Dr [address]  . ANTERIOR CERVICAL DECOMP/DISCECTOMY FUSION N/A 06/19/2015   Procedure: Removal of Codman anterior cervical plate;   Anterior Cervcial Decompression Fusion Cervical Four-Five;  Surgeon: Shirlean Kelly, MD;  Location: [facility] NEURO ORS;  Service: Neurosurgery;  Laterality: N/A;  left side approach  . APPENDECTOMY    . BREAST ENHANCEMENT SURGERY    . CHOLECYSTECTOMY    . COLONOSCOPY    . ECTOPIC PREGNANCY SURGERY     X 4 and miscarriage  . ESOPHAGOGASTRODUODENOSCOPY N/A 05/19/2012   Procedure: ESOPHAGOGASTRODUODENOSCOPY (EGD);  Surgeon: Theda Belfast, MD;  Location: [facility] ENDOSCOPY;  Service: Endoscopy;  Laterality: N/A;  . NECK SURGERY    .  PEG PLACEMENT      Family History  Problem Relation Age of Onset  . Hypertension Brother   . Alcohol abuse Brother   . Alcohol abuse Mother   . Cancer Mother     pancreatic  . Heart disease Father   . Alcohol abuse Father   . Heart disease Paternal Uncle   . Alcohol abuse Paternal Uncle     Allergies  Allergen Reactions  . Dilaudid [Hydromorphone Hcl] Itching    Can take with Benadryl  . Hydrocodone-Homatropine Nausea And Vomiting  . Codeine Nausea And Vomiting and Rash    Patient hasn't had it Codeine She doesn't think she has an existing allergy    Current Outpatient Prescriptions on File Prior to Visit  Medication Sig Dispense Refill  . albuterol (PROVENTIL HFA;VENTOLIN HFA) 108 (90 Base) MCG/ACT inhaler Inhale 2 puffs into the lungs every 4 (four) hours as needed for wheezing or shortness of breath. 1 Inhaler 1  . albuterol (PROVENTIL) (2.5 MG/3ML) 0.083% nebulizer solution Take 3 mLs (2.5 mg total) by nebulization every 2 (two) hours as needed for wheezing or shortness of breath. 75 mL 1  . aspirin 81 MG tablet Take 81 mg by mouth daily. Reported on 07/15/2015    . buPROPion (WELLBUTRIN XL) 300 MG 24 hr tablet Take 300 mg by mouth daily. Taking 150 mg wellbutrin    . clonazePAM (KLONOPIN) 0.5 MG tablet Take 1 tablet (0.5 mg total) by mouth 2 (two) times daily as needed for anxiety. 60 tablet 1  . estradiol (ESTRACE) 1 MG tablet Take 1 mg by mouth daily.    . fenofibrate (TRICOR) 145 MG tablet Take 1 tablet (145 mg total) by mouth daily. 30 tablet 3  . fluticasone (FLONASE) 50 MCG/ACT nasal spray Place 2 sprays into both nostrils 2 (two) times daily.    . montelukast (SINGULAIR) 10 MG tablet Take 1 tablet (10 mg total) by mouth at bedtime as needed. 30 tablet 3  . Multiple Vitamin (MULTI VITAMIN DAILY PO) Take 1 tablet by mouth daily.     . SUMAtriptan (IMITREX) 100 MG tablet Take 1 tablet (100 mg total) by mouth every 2 (two) hours as needed for migraine or headache. May  repeat in 2 hours if headache persists or recurs. 15 tablet 11  . tiZANidine (ZANAFLEX) 4 MG tablet Take 1 tablet (4 mg total) by mouth every 6 (six) hours as needed for muscle spasms. 90 tablet 1  . traZODone (DESYREL) 100 MG tablet Take 100-200 mg by mouth at bedtime.     . [DISCONTINUED] gabapentin (NEURONTIN) 300 MG capsule Take 1 capsule (300 mg total) by mouth 3 (three) times daily. (Patient not taking: Reported on 06/12/2015) 90 capsule 0   No current  facility-administered medications on file prior to visit.     BP 110/68 (BP Location: Left Arm, Patient Position: Sitting, Cuff Size: Normal)   Pulse 66   Temp 98.1 F (36.7 C) (Oral)   Ht 5\' 3"  (1.6 m)   Wt 133 lb 12.8 oz (60.7 kg)   SpO2 98%   BMI 23.70 kg/m       Objective:   Physical Exam  General  Mental Status - Alert. General Appearance - Well groomed. Not in acute distress.  Skin Rashes- No Rashes.  HEENT Head- Normal. Ear Auditory Canal - Left- Normal. Right - Normal.Tympanic Membrane- Left- Normal. Right- Normal. Eye Sclera/Conjunctiva- Left- Normal. Right- Normal. Nose & Sinuses Nasal Mucosa- Left-  Boggy and Congested. Right-  Boggy and  Congested.Bilateral maxillary and frontal sinus pressure. Mouth & Throat Lips: Upper Lip- Normal: no dryness, cracking, pallor, cyanosis, or vesicular eruption. Lower Lip-Normal: no dryness, cracking, pallor, cyanosis or vesicular eruption. Buccal Mucosa- Bilateral- No Aphthous ulcers. Oropharynx- No Discharge or Erythema. Tonsils: Characteristics- Bilateral- No Erythema or Congestion. Size/Enlargement- Bilateral- No enlargement. Discharge- bilateral-None.  Neck Neck- Supple. No Masses.   Chest and Lung Exam Auscultation: Breath Sounds:- even and unlabored but scattered rhonchi and some expiratory wheezing.  Cardiovascular Auscultation:Rythm- Regular, rate and rhythm. Murmurs & Other Heart Sounds:Ausculatation of the heart reveal- No Murmurs.  Lymphatic Head &  Neck General Head & Neck Lymphatics: Bilateral: Description- No Localized lymphadenopathy.       Assessment & Plan:  You appear to have bronchitis and sinusitis. Rest hydrate and tylenol for fever. I am prescribing cough medicine benzonatate , and antibiotic(but will wait until chest xray back before deciding on oral antibiotic). For your nasal congestion rx flonase.   For wheezing taper prednisone and refill you neb solution.   For your headache which may be sinus infection related toradol 60 mg im. If HA with neurologic signs or symptoms then ED evaluation.  Will call you later on chest xray findings and lab work.  Also rocpehin 1 gram given in office today.(pt expresses concern for pneumonia)  Follow up in 7 days or as needed  Post xray review decided to give azithromycin antibiotic.

## 2015-12-02 NOTE — Progress Notes (Signed)
Pt has seen results on MyChart and message also sent for patient to call back if any questions.

## 2015-12-03 NOTE — Telephone Encounter (Signed)
Please advise on MyChart message that was sent by the patient.

## 2015-12-09 ENCOUNTER — Ambulatory Visit (HOSPITAL_BASED_OUTPATIENT_CLINIC_OR_DEPARTMENT_OTHER)
Admission: RE | Admit: 2015-12-09 | Discharge: 2015-12-09 | Disposition: A | Discharge status: Home / Self Care | Payer: Medicare Other | Source: Ambulatory Visit | Attending: Medical | Admitting: Medical

## 2015-12-09 DIAGNOSIS — J90 Pleural effusion, not elsewhere classified: Secondary | ICD-10-CM | POA: Insufficient documentation

## 2016-01-11 ENCOUNTER — Encounter: Payer: Self-pay | Admitting: Family Medicine

## 2016-01-12 ENCOUNTER — Other Ambulatory Visit: Payer: Self-pay

## 2016-01-12 ENCOUNTER — Encounter: Payer: Self-pay | Admitting: Pulmonary Disease

## 2016-01-12 ENCOUNTER — Ambulatory Visit (HOSPITAL_COMMUNITY)
Admission: RE | Admit: 2016-01-12 | Discharge: 2016-01-12 | Disposition: A | Discharge status: Home / Self Care | Payer: Medicare Other | Source: Ambulatory Visit | Attending: Pulmonary Disease | Admitting: Pulmonary Disease

## 2016-01-12 ENCOUNTER — Ambulatory Visit (INDEPENDENT_AMBULATORY_CARE_PROVIDER_SITE_OTHER): Payer: Medicare Other | Admitting: Pulmonary Disease

## 2016-01-12 VITALS — BP 98/66 | HR 79 | Ht 63.0 in | Wt 139.8 lb

## 2016-01-12 DIAGNOSIS — J4520 Mild intermittent asthma, uncomplicated: Secondary | ICD-10-CM

## 2016-01-12 DIAGNOSIS — R0600 Dyspnea, unspecified: Secondary | ICD-10-CM

## 2016-01-12 DIAGNOSIS — R059 Cough, unspecified: Secondary | ICD-10-CM | POA: Insufficient documentation

## 2016-01-12 DIAGNOSIS — F447 Conversion disorder with mixed symptom presentation: Secondary | ICD-10-CM

## 2016-01-12 DIAGNOSIS — G4710 Hypersomnia, unspecified: Secondary | ICD-10-CM | POA: Diagnosis not present

## 2016-01-12 DIAGNOSIS — R942 Abnormal results of pulmonary function studies: Secondary | ICD-10-CM | POA: Diagnosis not present

## 2016-01-12 DIAGNOSIS — R0609 Other forms of dyspnea: Secondary | ICD-10-CM

## 2016-01-12 DIAGNOSIS — R05 Cough: Secondary | ICD-10-CM

## 2016-01-12 DIAGNOSIS — F17210 Nicotine dependence, cigarettes, uncomplicated: Secondary | ICD-10-CM | POA: Diagnosis not present

## 2016-01-12 LAB — BLOOD GAS, ARTERIAL
Acid-base deficit: 0.7 mmol/L (ref 0.0–2.0)
Bicarbonate: 23.3 mmol/L (ref 20.0–28.0)
DRAWN BY: 270521
FIO2: 21
O2 SAT: 96.6 %
PATIENT TEMPERATURE: 98.6
PO2 ART: 87.3 mmHg (ref 83.0–108.0)
pCO2 arterial: 38.3 mmHg (ref 32.0–48.0)
pH, Arterial: 7.401 (ref 7.350–7.450)

## 2016-01-12 LAB — PULMONARY FUNCTION TEST
DL/VA % PRED: 101 %
DL/VA: 4.74 ml/min/mmHg/L
DLCO COR % PRED: 77 %
DLCO COR: 17.68 ml/min/mmHg
DLCO UNC % PRED: 74 %
DLCO unc: 17 ml/min/mmHg
FEF 25-75 PRE: 2.45 L/s
FEF 25-75 Post: 2.27 L/sec
FEF2575-%CHANGE-POST: -7 %
FEF2575-%PRED-POST: 91 %
FEF2575-%PRED-PRE: 99 %
FEV1-%CHANGE-POST: -6 %
FEV1-%PRED-PRE: 90 %
FEV1-%Pred-Post: 84 %
FEV1-Post: 2.18 L
FEV1-Pre: 2.33 L
FEV1FVC-%CHANGE-POST: -1 %
FEV1FVC-%PRED-PRE: 108 %
FEV6-%CHANGE-POST: -5 %
FEV6-%PRED-POST: 80 %
FEV6-%Pred-Pre: 84 %
FEV6-Post: 2.57 L
FEV6-Pre: 2.71 L
FEV6FVC-%Pred-Post: 103 %
FEV6FVC-%Pred-Pre: 103 %
FVC-%Change-Post: -5 %
FVC-%Pred-Post: 78 %
FVC-%Pred-Pre: 82 %
FVC-Post: 2.57 L
FVC-Pre: 2.71 L
POST FEV1/FVC RATIO: 85 %
Post FEV6/FVC ratio: 100 %
Pre FEV1/FVC ratio: 86 %
Pre FEV6/FVC Ratio: 100 %
RV % pred: 103 %
RV: 1.92 L
TLC % pred: 88 %
TLC: 4.36 L

## 2016-01-12 MED ORDER — ALBUTEROL SULFATE (2.5 MG/3ML) 0.083% IN NEBU
2.5000 mg | INHALATION_SOLUTION | Freq: Once | RESPIRATORY_TRACT | Status: AC
  Administered 2016-01-12: 2.5 mg via RESPIRATORY_TRACT

## 2016-01-12 NOTE — Assessment & Plan Note (Signed)
Recent cough since oct 2017. Denies UACS.  Likely has GERD as she coughs at night.  I told her we need to Rx with PPI but she said  "she does not have reflux and does not want to be on meds." She quit smoking. Needs PFT and ABG.

## 2016-01-12 NOTE — Assessment & Plan Note (Signed)
PFT and ABG. Has asthma but it is stable. H/O Diaphragm not fxning well while she was having neurologic sx. her neurologic symptoms are improved and stable.

## 2016-01-12 NOTE — Telephone Encounter (Signed)
Last seen 12/01/15  Last filled 09/12/15 #[id number]  NO UDS ON FILE NEEDS CONTROLLED SUBSTANCE CONTRACT   Please advise  PC

## 2016-01-12 NOTE — Assessment & Plan Note (Signed)
She had a [facility] in [facility] which was (-) for OSA.  She had neurologic symptoms then Was diagnosed with PLS/ALS. Her diaphragm allegedly was "not functioning well".  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 At that time, pt was on hospice 2/2 worsening neurologic issues.  She remained on hospice until 2013. She was discharged from hospice as her neurologic symptoms have steadily improved and have remained stable.   Currently, patient sleeps 7 hrs/night.  Pt has snoring, witneesed apneas, gasping, choking. She wakes up tired in am.  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  (-) abnormal behavior in sleep other than sleep talking. ESS 12.   Has migraines. Has daily HA in am.  Plan : We discussed about the diagnosis of Obstructive Sleep Apnea (OSA) and implications of untreated OSA. We discussed about CPAP and BiPaP as possible treatment options.    We will schedule the patient for a sleep study. Plan for a sleep lab study.  If (-) sleep study, patient was wondering whether she can still be placed on BiPAP for RVD as the diagnosis. She feels her breathing is shallow. Pt with functional neurologic disorder.    Patient was instructed to call the office if he/she has not heard back from the office 1-2 weeks after the sleep study.   Patient was instructed to call the office if he/she is having issues with the PAP device.   We discussed good sleep hygiene.   Patient was advised not to engage in activities requiring concentration and/or vigilance if he/she is sleepy.  Patient was advised not to drive if he/she is sleepy.

## 2016-01-12 NOTE — Telephone Encounter (Signed)
OK to refill Clonazepam with same number and 1 sig but have her sign a contract and do a UDS

## 2016-01-12 NOTE — Patient Instructions (Signed)
It was a pleasure taking care of you today!  We will schedule you to have a sleep study to determine if you have sleep apnea.   We will get a lab sleep study.  You will be scheduled to have a lab sleep study in 4-6 weeks.  Someone from the sleep lab will call you in 2-3 days to schedule the study with you.  They usually have cancellations every night so most likely, they will have openings for a lab sleep study next week or so.  We encourage you to do your sleep study then if possible. Please give us a call in a week is no one from the sleep lab calls you in 2-3 days.   If the sleep study is positive, we will order you a CPAP  machine.  Please call the office if you do NOT receive your machine in the next 1-2 weeks.   Please make sure you use your CPAP device everytime you sleep.  We will monitor the usage of your machine per your insurance requirement.  Your insurance company may take the machine from you if you are not using it regularly.   Please clean the mask, tubings, filter, water reservoir with soapy water every week.  Please use distilled water for the water reservoir.   Please call the office or your machine provider (DME company) if you are having issues with the device.   We will get a breathing test and blood test.   Return to clinic in 2 months  with Dr. Christene Slatese Dios.

## 2016-01-12 NOTE — Assessment & Plan Note (Signed)
Patient with functional neurologic disorder. Sees neurology every 6 months. Her neurologic symptoms are stable. She said she was placed on BiPAP because her "diaphragm" was weak and not working.  Plan for PFT and ABG. CXR 12/2015 > diaphragm looked okay. No abnormality seen.

## 2016-01-12 NOTE — Assessment & Plan Note (Signed)
Asthma is stable. Uses alb prn. Also on singulair.

## 2016-01-12 NOTE — Progress Notes (Signed)
Subjective:    Patient ID: Kayla [name], female    DOB: 05/09/1960, 56 y.o.   MRN: [id number]  HPI   This is the case of Kayla [name], 56 y.o. Female, who was referred by Dr. Danise EdgeStacey Blyth in consultation regarding possible OSA.   As you very well know, patient is has a 20 PY smoking history, quit new years day 2018, was diagnosed with asthma as an adult.  Asthma has been stable until October 2017. Better. Uses alb MDI and neulizer. Also is on singualir.  She has sinus allergies.   She is not on o2.   She had a PSG in 2006 in [facility] which was (-) for OSA.  She had neurologic symptoms then Was diagnosed with PLS/ALS. Her diaphragm allegedly was "not functioning well".  She was placed on Bipap in 2008. At that time, pt was on hospice 2/2 worsening neurologic issues.  She remained on hospice until 2013. She was discharged from hospice as her neurologic symptoms have steadily improved and have remained stable.   She has not been on PAP device since being off Bipap in 2014.   Patient sleeps 7 hrs/night.  Pt has snoring, witneesed apneas, gasping, choking. She wakes up tired in am.  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  (-) abnormal behavior in sleep other than sleep talking. ESS 12.   Has migraines. Has daily HA in am.   She sees Neurology q 6 mos. Her neurologic sx are stable.   Review of Systems  Constitutional: Negative.  Negative for fever and unexpected weight change.  HENT: Positive for congestion. Negative for dental problem, ear pain, nosebleeds, postnasal drip, rhinorrhea, sinus pressure, sneezing, sore throat and trouble swallowing.   Eyes: Positive for itching. Negative for redness.  Respiratory: Positive for cough, shortness of breath and wheezing. Negative for chest tightness.   Cardiovascular: Negative.  Negative for palpitations and leg swelling.  Gastrointestinal: Positive for nausea. Negative for vomiting.  Endocrine: Negative.   Genitourinary: Negative.  Negative for  dysuria.  Musculoskeletal: Positive for joint swelling.  Skin: Negative.  Negative for rash.  Allergic/Immunologic: Positive for environmental allergies.  Neurological: Positive for dizziness and headaches.  Hematological: Negative.  Does not bruise/bleed easily.  Psychiatric/Behavioral: Negative.  Negative for dysphoric mood. The patient is not nervous/anxious.    Past Medical History:  Diagnosis Date  . Allergic symptoms 11/06/2015  . Anxiety   . Asthma 11/06/2015  . Chronic kidney disease    pt stated "I have Stage 3 kidney disease"  . Complication of anesthesia    Pt stated "sometimes I have trouble waking up"  . Constipation due to pain medication   . DDD (degenerative disc disease), cervical   . Depression   . Depression with anxiety 07/15/2015  . Functional neurological symptom disorder with mixed symptoms 05/19/2012   2007  Started then was tripping up steps. 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 Left foot drop but has worked hard not to need AFO and is stronger now.     . G tube feedings (HCC)   . Glaucoma   . History of chicken pox 07/15/2015  . Hyperlipidemia   . Hyperlipidemia, mixed 07/25/2015  . Hilda BladesLou Gehrig disease [facility](HCC)   . Migraine    Takes Imitrex  . Neuromuscular disease (HCC)   . Osteoarthritis of back   . Pneumonia 07/15/2015  . Preventative health care 09/21/2015  . Primary lateral sclerosis   .  RLL pneumonia (HCC) 07/15/2015  . Shortness of breath dyspnea    with exertion  . Sleep apnea 09/12/2015   H/o Bipap and VPAP use in past   (-) CA, DVT  Family History  Problem Relation Age of Onset  . Hypertension Brother   . Alcohol abuse Brother   . Alcohol abuse Mother   . Cancer Mother     pancreatic  . Heart disease Father   . Alcohol abuse Father   . Heart disease Paternal Uncle   . Alcohol abuse Paternal Uncle      Past Surgical History:  Procedure Laterality Date  . ABDOMINAL HYSTERECTOMY     TAH b/l SPO, Dr  [address]  . ANTERIOR CERVICAL DECOMP/DISCECTOMY FUSION N/A 06/19/2015   Procedure: Removal of Codman anterior cervical plate;   Anterior Cervcial Decompression Fusion Cervical Four-Five;  Surgeon: Shirlean Kelly, MD;  Location: [facility];  Service: Neurosurgery;  Laterality: N/A;  left side approach  . APPENDECTOMY    . BREAST ENHANCEMENT SURGERY    . CHOLECYSTECTOMY    . COLONOSCOPY    . ECTOPIC PREGNANCY SURGERY     X 4 and miscarriage  . ESOPHAGOGASTRODUODENOSCOPY N/A 05/19/2012   Procedure: ESOPHAGOGASTRODUODENOSCOPY (EGD);  Surgeon: Theda Belfast, MD;  Location: [facility] ENDOSCOPY;  Service: Endoscopy;  Laterality: N/A;  . NECK SURGERY    . PEG PLACEMENT      Social History   Social History  . Marital status: Married    Spouse name: N/A  . Number of children: N/A  . Years of education: college   Occupational History  . None     Social History Main Topics  . Smoking status: Former Smoker    Packs/day: 0.50    Years: 30.00    Types: Cigarettes    Quit date: 01/05/2016  . Smokeless tobacco: Never Used  . Alcohol use Yes     Comment: occasional  . Drug use: No  . Sexual activity: Yes    Birth control/ protection: Surgical   Other Topics Concern  . Not on file   Social History Narrative   Lives with boyfriend, widowed, then divorced, remarried 2017. No major dietary restrictions. 1/2 ppd, drinks a small amount socially     Allergies  Allergen Reactions  . Dilaudid [Hydromorphone Hcl] Itching    Can take with Benadryl  . Hydrocodone-Homatropine Nausea And Vomiting  . Codeine Nausea And Vomiting and Rash    Patient hasn't had it Codeine She doesn't think she has an existing allergy     Outpatient Medications Prior to Visit  Medication Sig Dispense Refill  . albuterol (PROVENTIL HFA;VENTOLIN HFA) 108 (90 Base) MCG/ACT inhaler Inhale 2 puffs into the lungs every 4 (four) hours as needed for wheezing or shortness of breath. 1 Inhaler 1  . albuterol (PROVENTIL) (2.5  MG/3ML) 0.083% nebulizer solution Take 3 mLs (2.5 mg total) by nebulization every 2 (two) hours as needed for wheezing or shortness of breath. 75 mL 1  . albuterol (PROVENTIL) (2.5 MG/3ML) 0.083% nebulizer solution Take 3 mLs (2.5 mg total) by nebulization every 6 (six) hours as needed for wheezing or shortness of breath. 150 mL 1  . aspirin 81 MG tablet Take 81 mg by mouth daily. Reported on 07/15/2015    . benzonatate (TESSALON) 100 MG capsule Take 1 capsule (100 mg total) by mouth 3 (three) times daily as needed for cough. 21 capsule 0  . buPROPion (WELLBUTRIN XL) 300 MG 24 hr tablet Take 300 mg  by mouth daily. Taking 150 mg wellbutrin    . clonazePAM (KLONOPIN) 0.5 MG tablet Take 1 tablet (0.5 mg total) by mouth 2 (two) times daily as needed for anxiety. 60 tablet 1  . estradiol (ESTRACE) 1 MG tablet Take 1 mg by mouth daily.    . fenofibrate (TRICOR) 145 MG tablet Take 1 tablet (145 mg total) by mouth daily. 30 tablet 3  . fluticasone (FLONASE) 50 MCG/ACT nasal spray Place 2 sprays into both nostrils 2 (two) times daily.    . montelukast (SINGULAIR) 10 MG tablet Take 1 tablet (10 mg total) by mouth at bedtime as needed. 30 tablet 3  . Multiple Vitamin (MULTI VITAMIN DAILY PO) Take 1 tablet by mouth daily.     . SUMAtriptan (IMITREX) 100 MG tablet Take 1 tablet (100 mg total) by mouth every 2 (two) hours as needed for migraine or headache. May repeat in 2 hours if headache persists or recurs. 15 tablet 11  . tiZANidine (ZANAFLEX) 4 MG tablet Take 1 tablet (4 mg total) by mouth every 6 (six) hours as needed for muscle spasms. 90 tablet 1  . traZODone (DESYREL) 100 MG tablet Take 100-200 mg by mouth at bedtime.     . fluticasone (FLONASE) 50 MCG/ACT nasal spray Place 2 sprays into both nostrils daily. 16 g 1  . azithromycin (ZITHROMAX) 250 MG tablet Take 2 tablets by mouth on day 1, followed by 1 tablet by mouth daily for 4 days. (Patient not taking: Reported on 01/12/2016) 6 tablet 0  . predniSONE  (DELTASONE) 10 MG tablet 5 tab po day 1, 4 tab po day 2, 3 tab po day 3, 2 tab po day 4, 1 tab po day 5 (Patient not taking: Reported on 01/12/2016) 15 tablet 0   No facility-administered medications prior to visit.    No orders of the defined types were placed in this encounter.       Objective:   Physical Exam  Vitals:  Vitals:   01/12/16 1143  BP: 98/66  Pulse: 79  SpO2: 99%  Weight: 139 lb 12.8 oz (63.4 kg)  Height: 5\' 3"  (1.6 m)    Constitutional/General:  Pleasant, well-nourished, well-developed, not in any distress,  Comfortably seating.  Well kempt  Body mass index is 24.76 kg/m. Wt Readings from Last 3 Encounters:  01/12/16 139 lb 12.8 oz (63.4 kg)  12/01/15 133 lb 12.8 oz (60.7 kg)  11/06/15 137 lb 6 oz (62.3 kg)    HEENT: Pupils equal and reactive to light and accommodation. Anicteric sclerae. Normal nasal mucosa.   No oral  lesions,  mouth clear,  oropharynx clear, no postnasal drip. (-) Oral thrush. No dental caries.  Airway - Mallampati class III  Neck: No masses. Midline trachea. No JVD, (-) LAD. (-) bruits appreciated.  Respiratory/Chest: Grossly normal chest. (-) deformity. (-) Accessory muscle use.  Symmetric expansion. (-) Tenderness on palpation.  Resonant on percussion.  Diminished BS on both lower lung zones. (-) wheezing, crackles, rhonchi (-) egophony  Cardiovascular: Regular rate and  rhythm, heart sounds normal, no murmur or gallops, no peripheral edema  Gastrointestinal:  Normal bowel sounds. Soft, non-tender. No hepatosplenomegaly.  (-) masses.   Musculoskeletal:  Normal muscle tone. Normal gait.   Extremities: Grossly normal. (-) clubbing, cyanosis.  (-) edema  Skin: (-) rash,lesions seen.   Neurological/Psychiatric : alert, oriented to time, place, person. Normal mood and affect          Assessment & Plan:  Hypersomnia She  had a PSG in 2006 in [facility] which was (-) for OSA.  She had neurologic symptoms then Was  diagnosed with PLS/ALS. Her diaphragm allegedly was "not functioning well".  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 At that time, pt was on hospice 2/2 worsening neurologic issues.  She remained on hospice until 2013. She was discharged from hospice as her neurologic symptoms have steadily improved and have remained stable.   Currently, patient sleeps 7 hrs/night.  Pt has snoring, witneesed apneas, gasping, choking. She wakes up tired in am.  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  (-) abnormal behavior in sleep other than sleep talking. ESS 12.   Has migraines. Has daily HA in am.  Plan : We discussed about the diagnosis of Obstructive Sleep Apnea (OSA) and implications of untreated OSA. We discussed about CPAP and BiPaP as possible treatment options.    We will schedule the patient for a sleep study. Plan for a sleep lab study.  If (-) sleep study, patient was wondering whether she can still be placed on BiPAP for RVD as the diagnosis. She feels her breathing is shallow. Pt with functional neurologic disorder.    Patient was instructed to call the office if he/she has not heard back from the office 1-2 weeks after the sleep study.   Patient was instructed to call the office if he/she is having issues with the PAP device.   We discussed good sleep hygiene.   Patient was advised not to engage in activities requiring concentration and/or vigilance if he/she is sleepy.  Patient was advised not to drive if he/she is sleepy.     Functional neurological symptom disorder with mixed symptoms Patient with functional neurologic disorder. Sees neurology every 6 months. Her neurologic symptoms are stable. She said she was placed on BiPAP because her "diaphragm" was weak and not working.  Plan for PFT and ABG. CXR 12/2015 > diaphragm looked okay. No abnormality seen.  Asthma Asthma is stable. Uses alb prn. Also on singulair.   Cough Recent cough since oct 2017. Denies  UACS.  Likely has GERD as she coughs at night.  I told her we need to Rx with PPI but she said  "she does not have reflux and does not want to be on meds." She quit smoking. Needs PFT and ABG.   Exertional dyspnea PFT and ABG. Has asthma but it is stable. H/O Diaphragm not fxning well while she was having neurologic sx. her neurologic symptoms are improved and stable.     Thank you very much for letting me participate in this patient's care. Please do not hesitate to give me a call if you have any questions or concerns regarding the treatment plan.   Patient will follow up with me in 2 months.     Pollie Meyer, MD 01/12/2016   12:25 PM Pulmonary and Critical Care Medicine [facility] Pager: [phone] Office: [phone], Fax: [phone]

## 2016-01-13 ENCOUNTER — Other Ambulatory Visit: Payer: Self-pay

## 2016-01-13 MED ORDER — BUPROPION HCL ER (XL) 300 MG PO TB24
300.0000 mg | ORAL_TABLET | Freq: Every day | ORAL | 3 refills | Status: DC
Start: 2016-01-13 — End: 2016-10-07

## 2016-01-16 ENCOUNTER — Encounter: Payer: Self-pay | Admitting: Family Medicine

## 2016-01-16 ENCOUNTER — Ambulatory Visit (INDEPENDENT_AMBULATORY_CARE_PROVIDER_SITE_OTHER): Payer: Medicare Other | Admitting: Family Medicine

## 2016-01-16 DIAGNOSIS — R739 Hyperglycemia, unspecified: Secondary | ICD-10-CM | POA: Diagnosis not present

## 2016-01-16 DIAGNOSIS — F418 Other specified anxiety disorders: Secondary | ICD-10-CM

## 2016-01-16 DIAGNOSIS — R51 Headache: Secondary | ICD-10-CM

## 2016-01-16 DIAGNOSIS — G4710 Hypersomnia, unspecified: Secondary | ICD-10-CM | POA: Diagnosis not present

## 2016-01-16 DIAGNOSIS — R519 Headache, unspecified: Secondary | ICD-10-CM

## 2016-01-16 DIAGNOSIS — E782 Mixed hyperlipidemia: Secondary | ICD-10-CM | POA: Diagnosis not present

## 2016-01-16 MED ORDER — TIZANIDINE HCL 4 MG PO TABS: 4.0000 mg | ORAL_TABLET | Freq: Four times a day (QID) | ORAL | 1 refills | Status: DC | PRN

## 2016-01-16 MED ORDER — TRAZODONE HCL 100 MG PO TABS: 100.0000 mg | ORAL_TABLET | Freq: Every day | ORAL | 1 refills | Status: DC

## 2016-01-16 MED ORDER — CLONAZEPAM 0.5 MG PO TABS: 0.5000 mg | ORAL_TABLET | Freq: Two times a day (BID) | ORAL | 1 refills | Status: DC | PRN

## 2016-01-16 MED ORDER — RIZATRIPTAN BENZOATE 10 MG PO TABS: 10.0000 mg | ORAL_TABLET | ORAL | 2 refills | Status: DC | PRN

## 2016-01-16 NOTE — Assessment & Plan Note (Signed)
minimize simple carbs. Increase exercise as tolerated.  

## 2016-01-16 NOTE — Assessment & Plan Note (Addendum)
Following with pulmonology, not worsening but is debilitating some days

## 2016-01-16 NOTE — Assessment & Plan Note (Signed)
Encouraged heart healthy diet, increase exercise, avoid trans fats, consider a krill oil cap daily 

## 2016-01-16 NOTE — Progress Notes (Signed)
Pre visit review using our clinic review tool, if applicable. No additional management support is needed unless otherwise documented below in the visit note. 

## 2016-01-16 NOTE — Patient Instructions (Signed)
Hoarseness Hoarseness is any abnormal change in your voice.Hoarseness can make it difficult to speak. Your voice may sound raspy, breathy, or strained. Hoarseness is caused by a problem with the vocal cords. The vocal cords are two bands of tissue inside your voice box (larynx). When you speak, your vocal cords move back and forth to create sound. The surfaces of your vocal cords need to be smooth for your voice to sound clear. Swelling or lumps on the vocal cords can cause hoarseness. Common causes of vocal cord problems include:  Upper airway infection.  A long-term cough.  Straining or overusing your voice.  Smoking.  Allergies.  Vocal cord growths.  Stomach acids that flow up from your stomach and irritate your vocal cords (gastroesophageal reflux). Follow these instructions at home: Watch your condition for any changes. To ease any discomfort that you feel:  Rest your voice. Do not whisper. Whispering can cause muscle strain.  Do not speak in a loud or harsh voice that makes your hoarseness worse.  Do not use any tobacco products, including cigarettes, chewing tobacco, or electronic cigarettes. If you need help quitting, ask your health care provider.  Avoid secondhand smoke.  Do not eat foods that give you heartburn. Heartburn can make gastroesophageal reflux worse.  Do not drink coffee.  Do not drink alcohol.  Drink enough fluids to keep your urine clear or pale yellow.  Use a humidifier if the air in your home is dry. Contact a health care provider if:  You have hoarseness that lasts longer than 3 weeks.  You almost lose or completelylose your voice for longer than 3 days.  You have pain when you swallow or try to talk.  You feel a lump in your neck. Get help right away if:  You have trouble swallowing.  You feel as though you are choking when you swallow.  You cough up blood or vomit blood.  You have trouble breathing. This information is not  intended to replace advice given to you by your health care provider. Make sure you discuss any questions you have with your health care provider. Document Released: 12/04/2004 Document Revised: 05/29/2015 Document Reviewed: 12/12/2013 [employer] Interactive Patient Education  2017 [employer]  

## 2016-01-16 NOTE — Progress Notes (Signed)
Subjective:    Patient ID: Kayla [name], female    DOB: [birth year], 56 y.o.   MRN: [id number]  Chief Complaint  Patient presents with  . Follow-up    HPI Patient is in today for 2 month follow up patient has no acute concerns. She continues to struggle with fatigue and hypersomnolence is undergoing a work up with pulmonology. She also notes some anxiety and deprression but no suicdial ideation. Denies CP/palp/SOB/congestion/fevers/GI or GU c/o. Taking meds as prescribed. Is noting headaches are not responding to Imitrex as well as they used to. Has headaches at least weekly at this time.   Past Medical History:  Diagnosis Date  . Allergic symptoms 11/06/2015  . Anxiety   . Asthma 11/06/2015  . Chronic kidney disease    pt stated "I have Stage 3 kidney disease"  . Complication of anesthesia    Pt stated "sometimes I have trouble waking up"  . Constipation due to pain medication   . DDD (degenerative disc disease), cervical   . Depression   . Depression with anxiety 07/15/2015  . Functional neurological symptom disorder with mixed symptoms 05/19/2012   2007  Started then was tripping up steps. 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 Left foot drop but has worked hard not to need AFO and is stronger now.     . G tube feedings (HCC)   . Glaucoma   . History of chicken pox 07/15/2015  . Hyperlipidemia   . Hyperlipidemia, mixed 07/25/2015  . 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 Migraine    Takes Imitrex  . Neuromuscular disease (HCC)   . Osteoarthritis of back   . Pneumonia 07/15/2015  . Preventative health care 09/21/2015  . Primary lateral sclerosis   . RLL pneumonia (HCC) 07/15/2015  . Shortness of breath dyspnea    with exertion  . Sleep apnea 09/12/2015   H/o Bipap and VPAP use in past    Past Surgical History:  Procedure Laterality Date  . ABDOMINAL HYSTERECTOMY     TAH b/l SPO, Dr [address]  . ANTERIOR CERVICAL DECOMP/DISCECTOMY FUSION N/A  06/19/2015   Procedure: Removal of Codman anterior cervical plate;   Anterior Cervcial Decompression Fusion Cervical Four-Five;  Surgeon: Shirlean Kelly, MD;  Location: [facility];  Service: Neurosurgery;  Laterality: N/A;  left side approach  . APPENDECTOMY    . BREAST ENHANCEMENT SURGERY    . CHOLECYSTECTOMY    . COLONOSCOPY    . ECTOPIC PREGNANCY SURGERY     X 4 and miscarriage  . ESOPHAGOGASTRODUODENOSCOPY N/A 05/19/2012   Procedure: ESOPHAGOGASTRODUODENOSCOPY (EGD);  Surgeon: Theda Belfast, MD;  Location: [facility];  Service: Endoscopy;  Laterality: N/A;  . NECK SURGERY    . PEG PLACEMENT      Family History  Problem Relation Age of Onset  . Hypertension Brother   . Alcohol abuse Brother   . Alcohol abuse Mother   . Cancer Mother     pancreatic  . Heart disease Father   . Alcohol abuse Father   . Heart disease Paternal Uncle   . Alcohol abuse Paternal Uncle     Social History   Social History  . Marital status: Married    Spouse name: N/A  . Number of children: N/A  . Years of education: college   Occupational History  . None     Social History Main Topics  . Smoking status: Former Smoker  Packs/day: 0.50    Years: 30.00    Types: Cigarettes    Quit date: 01/05/2016  . Smokeless tobacco: Never Used  . Alcohol use Yes     Comment: occasional  . Drug use: No  . Sexual activity: Yes    Birth control/ protection: Surgical   Other Topics Concern  . Not on file   Social History Narrative   Lives with boyfriend, widowed, then divorced, remarried 2017. No major dietary restrictions. 1/2 ppd, drinks a small amount socially    Outpatient Medications Prior to Visit  Medication Sig Dispense Refill  . albuterol (PROVENTIL HFA;VENTOLIN HFA) 108 (90 Base) MCG/ACT inhaler Inhale 2 puffs into the lungs every 4 (four) hours as needed for wheezing or shortness of breath. 1 Inhaler 1  . albuterol (PROVENTIL) (2.5 MG/3ML) 0.083% nebulizer solution Take 3 mLs (2.5 mg  total) by nebulization every 2 (two) hours as needed for wheezing or shortness of breath. 75 mL 1  . albuterol (PROVENTIL) (2.5 MG/3ML) 0.083% nebulizer solution Take 3 mLs (2.5 mg total) by nebulization every 6 (six) hours as needed for wheezing or shortness of breath. 150 mL 1  . aspirin 81 MG tablet Take 81 mg by mouth daily. Reported on 07/15/2015    . benzonatate (TESSALON) 100 MG capsule Take 1 capsule (100 mg total) by mouth 3 (three) times daily as needed for cough. 21 capsule 0  . buPROPion (WELLBUTRIN XL) 300 MG 24 hr tablet Take 1 tablet (300 mg total) by mouth daily. Taking 150 mg wellbutrin 30 tablet 3  . clonazePAM (KLONOPIN) 0.5 MG tablet Take 1 tablet (0.5 mg total) by mouth 2 (two) times daily as needed for anxiety. 60 tablet 1  . estradiol (ESTRACE) 1 MG tablet Take 1 mg by mouth daily.    . fenofibrate (TRICOR) 145 MG tablet Take 1 tablet (145 mg total) by mouth daily. 30 tablet 3  . fluticasone (FLONASE) 50 MCG/ACT nasal spray Place 2 sprays into both nostrils 2 (two) times daily.    . montelukast (SINGULAIR) 10 MG tablet Take 1 tablet (10 mg total) by mouth at bedtime as needed. 30 tablet 3  . Multiple Vitamin (MULTI VITAMIN DAILY PO) Take 1 tablet by mouth daily.     . SUMAtriptan (IMITREX) 100 MG tablet Take 1 tablet (100 mg total) by mouth every 2 (two) hours as needed for migraine or headache. May repeat in 2 hours if headache persists or recurs. 15 tablet 11  . tiZANidine (ZANAFLEX) 4 MG tablet Take 1 tablet (4 mg total) by mouth every 6 (six) hours as needed for muscle spasms. 90 tablet 1  . traZODone (DESYREL) 100 MG tablet Take 100-200 mg by mouth at bedtime.      No facility-administered medications prior to visit.     Allergies  Allergen Reactions  . Dilaudid [Hydromorphone Hcl] Itching    Can take with Benadryl  . Hydrocodone-Homatropine Nausea And Vomiting  . Codeine Nausea And Vomiting and Rash    Patient hasn't had it Codeine She doesn't think she has an  existing allergy    Review of Systems  Constitutional: Positive for malaise/fatigue. Negative for fever.  HENT: Negative for congestion.   Eyes: Negative for blurred vision.  Respiratory: Negative for cough and shortness of breath.   Cardiovascular: Negative for chest pain, palpitations and leg swelling.  Gastrointestinal: Negative for vomiting.  Musculoskeletal: Negative for back pain.  Skin: Negative for rash.  Neurological: Positive for focal weakness. Negative for loss of  consciousness and headaches.  Psychiatric/Behavioral: Positive for depression. The patient is nervous/anxious.        Objective:    Physical Exam  Constitutional: She is oriented to person, place, and time. She appears well-developed and well-nourished. No distress.  HENT:  Head: Normocephalic and atraumatic.  Eyes: Conjunctivae are normal.  Neck: Normal range of motion. No thyromegaly present.  Cardiovascular: Normal rate and regular rhythm.   Pulmonary/Chest: Effort normal and breath sounds normal. She has no wheezes.  Abdominal: Soft. Bowel sounds are normal. There is no tenderness. There is no rebound and no guarding.  Musculoskeletal: Normal range of motion. She exhibits no edema or deformity.  Neurological: She is alert and oriented to person, place, and time.  Skin: Skin is warm and dry. She is not diaphoretic.  Psychiatric: She has a normal mood and affect.    BP 116/78 (BP Location: Left Arm, Patient Position: Sitting, Cuff Size: Normal)   Pulse 78   Temp 98.3 F (36.8 C) (Oral)   Wt 137 lb 3.2 oz (62.2 kg)   SpO2 97%   BMI 24.30 kg/m  Wt Readings from Last 3 Encounters:  01/16/16 137 lb 3.2 oz (62.2 kg)  01/12/16 139 lb 12.8 oz (63.4 kg)  12/01/15 133 lb 12.8 oz (60.7 kg)     Lab Results  Component Value Date   WBC 8.0 12/01/2015   HGB 14.8 12/01/2015   HCT 43.8 12/01/2015   PLT 338.0 12/01/2015   GLUCOSE 87 11/06/2015   CHOL 181 11/06/2015   TRIG 76.0 11/06/2015   HDL 74.30  11/06/2015   LDLCALC 92 11/06/2015   ALT 17 11/06/2015   AST 17 11/06/2015   NA 138 11/06/2015   K 3.5 11/06/2015   CL 101 11/06/2015   CREATININE 1.14 11/06/2015   BUN 18 11/06/2015   CO2 30 11/06/2015   TSH 1.28 11/06/2015   HGBA1C 5.6 11/06/2015    Lab Results  Component Value Date   TSH 1.28 11/06/2015   Lab Results  Component Value Date   WBC 8.0 12/01/2015   HGB 14.8 12/01/2015   HCT 43.8 12/01/2015   MCV 88.0 12/01/2015   PLT 338.0 12/01/2015   Lab Results  Component Value Date   NA 138 11/06/2015   K 3.5 11/06/2015   CO2 30 11/06/2015   GLUCOSE 87 11/06/2015   BUN 18 11/06/2015   CREATININE 1.14 11/06/2015   BILITOT 0.5 11/06/2015   ALKPHOS 36 (L) 11/06/2015   AST 17 11/06/2015   ALT 17 11/06/2015   PROT 7.3 11/06/2015   ALBUMIN 4.8 11/06/2015   CALCIUM 9.7 11/06/2015   ANIONGAP 12 06/19/2015   GFR 52.47 (L) 11/06/2015   Lab Results  Component Value Date   CHOL 181 11/06/2015   Lab Results  Component Value Date   HDL 74.30 11/06/2015   Lab Results  Component Value Date   LDLCALC 92 11/06/2015   Lab Results  Component Value Date   TRIG 76.0 11/06/2015   Lab Results  Component Value Date   CHOLHDL 2 11/06/2015   Lab Results  Component Value Date   HGBA1C 5.6 11/06/2015       Assessment & Plan:   Problem List Items Addressed This Visit    Headache    Encouraged increased hydration, 64 ounces of clear fluids daily. Minimize alcohol and caffeine. Eat small frequent meals with lean proteins and complex carbs. Avoid high and low blood sugars. Get adequate sleep, 7-8 hours a night. Needs exercise daily  preferably in the morning.she feels Imitrex is not working as well will try switch to Maxalt      Relevant Medications   tiZANidine (ZANAFLEX) 4 MG tablet   traZODone (DESYREL) 100 MG tablet   rizatriptan (MAXALT) 10 MG tablet   Depression with anxiety    Still struggles with anhedonia but does feel her meds are working adequately most  days.      Hyperglycemia     minimize simple carbs. Increase exercise as tolerated.       Hyperlipidemia, mixed    Encouraged heart healthy diet, increase exercise, avoid trans fats, consider a krill oil cap daily      Hypersomnia    Following with pulmonology, not worsening but is debilitating some days         I have discontinued Ms. Mabe's SUMAtriptan. I have also changed her traZODone. Additionally, I am having her start on rizatriptan. Lastly, I am having her maintain her estradiol, Multiple Vitamin (MULTI VITAMIN DAILY PO), aspirin, fluticasone, albuterol, albuterol, fenofibrate, montelukast, albuterol, benzonatate, clonazePAM, buPROPion, and tiZANidine.  Meds ordered this encounter  Medications  . tiZANidine (ZANAFLEX) 4 MG tablet    Sig: Take 1 tablet (4 mg total) by mouth every 6 (six) hours as needed for muscle spasms.    Dispense:  90 tablet    Refill:  1  . traZODone (DESYREL) 100 MG tablet    Sig: Take 1-2 tablets (100-200 mg total) by mouth at bedtime.    Dispense:  180 tablet    Refill:  1  . rizatriptan (MAXALT) 10 MG tablet    Sig: Take 1 tablet (10 mg total) by mouth as needed for migraine. May repeat in 2 hours if needed    Dispense:  10 tablet    Refill:  2    CMA served as Neurosurgeonscribe during this visit. History, Physical and Plan performed by medical provider. Documentation and orders reviewed and attested to.  Danise EdgeBLYTH, STACEY, MD

## 2016-01-25 NOTE — Assessment & Plan Note (Signed)
Encouraged increased hydration, 64 ounces of clear fluids daily. Minimize alcohol and caffeine. Eat small frequent meals with lean proteins and complex carbs. Avoid high and low blood sugars. Get adequate sleep, 7-8 hours a night. Needs exercise daily preferably in the morning.she feels Imitrex is not working as well will try switch to Maxalt

## 2016-01-25 NOTE — Assessment & Plan Note (Signed)
Still struggles with anhedonia but does feel her meds are working adequately most days.

## 2016-02-12 ENCOUNTER — Emergency Department (HOSPITAL_BASED_OUTPATIENT_CLINIC_OR_DEPARTMENT_OTHER): Payer: Medicare Other

## 2016-02-12 ENCOUNTER — Observation Stay (HOSPITAL_BASED_OUTPATIENT_CLINIC_OR_DEPARTMENT_OTHER)
Admission: EM | Admit: 2016-02-12 | Discharge: 2016-02-13 | Disposition: A | Discharge status: Home / Self Care | Payer: Medicare Other | Attending: Internal Medicine | Admitting: Internal Medicine

## 2016-02-12 ENCOUNTER — Encounter (HOSPITAL_BASED_OUTPATIENT_CLINIC_OR_DEPARTMENT_OTHER): Payer: Self-pay | Admitting: *Deleted

## 2016-02-12 DIAGNOSIS — R0789 Other chest pain: Secondary | ICD-10-CM | POA: Diagnosis not present

## 2016-02-12 DIAGNOSIS — Z9049 Acquired absence of other specified parts of digestive tract: Secondary | ICD-10-CM | POA: Diagnosis not present

## 2016-02-12 DIAGNOSIS — R51 Headache: Secondary | ICD-10-CM | POA: Insufficient documentation

## 2016-02-12 DIAGNOSIS — Z87891 Personal history of nicotine dependence: Secondary | ICD-10-CM | POA: Insufficient documentation

## 2016-02-12 DIAGNOSIS — E782 Mixed hyperlipidemia: Secondary | ICD-10-CM | POA: Diagnosis not present

## 2016-02-12 DIAGNOSIS — N189 Chronic kidney disease, unspecified: Secondary | ICD-10-CM | POA: Diagnosis not present

## 2016-02-12 DIAGNOSIS — G709 Myoneural disorder, unspecified: Secondary | ICD-10-CM | POA: Insufficient documentation

## 2016-02-12 DIAGNOSIS — Z7951 Long term (current) use of inhaled steroids: Secondary | ICD-10-CM | POA: Insufficient documentation

## 2016-02-12 DIAGNOSIS — R079 Chest pain, unspecified: Secondary | ICD-10-CM

## 2016-02-12 DIAGNOSIS — M21372 Foot drop, left foot: Secondary | ICD-10-CM | POA: Diagnosis not present

## 2016-02-12 DIAGNOSIS — Z8249 Family history of ischemic heart disease and other diseases of the circulatory system: Secondary | ICD-10-CM | POA: Insufficient documentation

## 2016-02-12 DIAGNOSIS — J45909 Unspecified asthma, uncomplicated: Secondary | ICD-10-CM | POA: Insufficient documentation

## 2016-02-12 DIAGNOSIS — G4733 Obstructive sleep apnea (adult) (pediatric): Secondary | ICD-10-CM | POA: Insufficient documentation

## 2016-02-12 DIAGNOSIS — F447 Conversion disorder with mixed symptom presentation: Secondary | ICD-10-CM | POA: Diagnosis present

## 2016-02-12 DIAGNOSIS — F418 Other specified anxiety disorders: Secondary | ICD-10-CM | POA: Diagnosis not present

## 2016-02-12 DIAGNOSIS — Z79899 Other long term (current) drug therapy: Secondary | ICD-10-CM | POA: Diagnosis not present

## 2016-02-12 DIAGNOSIS — Z7982 Long term (current) use of aspirin: Secondary | ICD-10-CM | POA: Diagnosis not present

## 2016-02-12 DIAGNOSIS — R531 Weakness: Secondary | ICD-10-CM | POA: Diagnosis not present

## 2016-02-12 DIAGNOSIS — G8929 Other chronic pain: Secondary | ICD-10-CM | POA: Diagnosis not present

## 2016-02-12 LAB — BASIC METABOLIC PANEL
ANION GAP: 7 (ref 5–15)
BUN: 14 mg/dL (ref 6–20)
CHLORIDE: 105 mmol/L (ref 101–111)
CO2: 26 mmol/L (ref 22–32)
Calcium: 9.6 mg/dL (ref 8.9–10.3)
Creatinine, Ser: 1.19 mg/dL — ABNORMAL HIGH (ref 0.44–1.00)
GFR calc Af Amer: 58 mL/min — ABNORMAL LOW (ref >60)
GFR, EST NON AFRICAN AMERICAN: 50 mL/min — AB (ref >60)
GLUCOSE: 89 mg/dL (ref 65–99)
POTASSIUM: 3.6 mmol/L (ref 3.5–5.1)
Sodium: 138 mmol/L (ref 135–145)

## 2016-02-12 LAB — D-DIMER, QUANTITATIVE (NOT AT ARMC)

## 2016-02-12 LAB — TROPONIN I: Troponin I: <0.03 ng/mL (ref <0.03)

## 2016-02-12 LAB — PROTIME-INR
INR: 0.99
PROTHROMBIN TIME: 13.1 s (ref 11.4–15.2)

## 2016-02-12 LAB — CBC
HEMATOCRIT: 37.4 % (ref 36.0–46.0)
HEMOGLOBIN: 12.4 g/dL (ref 12.0–15.0)
MCH: 30.1 pg (ref 26.0–34.0)
MCHC: 33.2 g/dL (ref 30.0–36.0)
MCV: 90.8 fL (ref 78.0–100.0)
Platelets: 287 10*3/uL (ref 150–400)
RBC: 4.12 MIL/uL (ref 3.87–5.11)
RDW: 12.4 % (ref 11.5–15.5)
WBC: 6.3 10*3/uL (ref 4.0–10.5)

## 2016-02-12 MED ORDER — SUMATRIPTAN SUCCINATE 6 MG/0.5ML ~~LOC~~ SOLN
6.0000 mg | Freq: Once | SUBCUTANEOUS | Status: AC
  Administered 2016-02-12: 6 mg via SUBCUTANEOUS
  Filled 2016-02-12: qty 0.5

## 2016-02-12 MED ORDER — SUMATRIPTAN SUCCINATE 25 MG PO TABS
25.0000 mg | ORAL_TABLET | Freq: Once | ORAL | Status: DC
  Filled 2016-02-12: qty 1

## 2016-02-12 NOTE — ED Notes (Signed)
Spasms of pain across chest intermittently. Chest pain protocol started

## 2016-02-12 NOTE — ED Notes (Signed)
Dr Otelia LimesLindzen paged

## 2016-02-12 NOTE — ED Triage Notes (Signed)
Chest tightness for a week. Pain in her shoulders. Dizziness for a long time.

## 2016-02-12 NOTE — ED Provider Notes (Signed)
[facility] Provider Note   CSN: [id number] Arrival date & time: 02/12/16  1758    By signing my name below, I, [name], attest that this documentation has been prepared under the direction and in the presence of Heide Scaleshristopher J Shamarr Faucett, MD. Electronically Signed: [name], ED Scribe. 02/12/16. 6:49 PM.  History   Chief Complaint Chief Complaint  Patient presents with  . Chest Pain   The history is provided by the patient and the spouse. No language interpreter was used.   HPI Comments: Kayla [name] is a 56 y.o. female with PMHx of an unknown neuromuscular disorder, asthma, CKD, and osteoarthritis who presents to the Emergency Department complaining of moderate, intermittent, centralized chest pain onset two days ago. She denies recent trauma or injury to the affected area. Pt describes it as a tightness in her chest and states feeling like "my heart was going to beat out of my chest". She notes having ~3 episodes of chest pain throughout the day today. Pt states her pain radiates to her bilateral shoulders. Pt reports associated SOB and light-headedness. She notes feeling near syncope, when sitting, that lasted for 20 minutes after her symptoms began. Pt states her chest pain is exacerbated with exertion. She reports associated nausea, diaphoresis, and lightheadedness. She reports taking one baby aspirin today with minimal relief. She notes having generalized weakness on the left side of her body, but states this is a recurrent problem. Pt denies leg swelling, dysuria, frequency, constipation, diarrhea, blood in stool, abdominal pain, fever. She also denies hx of blood clots. Pt notes Fhx of heart troubles on paternal side. Pt note she quit smoking on 01/05/2016.  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  Patient says that she was feeding tube dependent. Patient says she has done much better since that time.  She also has a strong family history of early heart disease in her family.   Past Medical History:  Diagnosis Date  . Allergic symptoms 11/06/2015  . Anxiety   . Asthma 11/06/2015  . Chronic kidney disease    pt stated "I have Stage 3 kidney disease"  . Complication of anesthesia    Pt stated "sometimes I have trouble waking up"  . Constipation due to pain medication   . DDD (degenerative disc disease), cervical   . Depression   . Depression with anxiety 07/15/2015  . Functional neurological symptom disorder with mixed symptoms 05/19/2012   2007  Started then was tripping up steps. 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 Left foot drop but has worked hard not to need AFO and is stronger now.     . G tube feedings (HCC)   . Glaucoma   . History of chicken pox 07/15/2015  . Hyperlipidemia   . Hyperlipidemia, mixed 07/25/2015  . Hilda BladesLou Gehrig disease [facility](HCC)   . Migraine    Takes Imitrex  . Neuromuscular disease (HCC)   . Osteoarthritis of back   . Pneumonia 07/15/2015  . Preventative health care 09/21/2015  . Primary lateral sclerosis   . RLL pneumonia (HCC) 07/15/2015  . Shortness of breath dyspnea    with exertion  . Sleep apnea 09/12/2015   H/o Bipap and VPAP use in past    Patient Active Problem List   Diagnosis Date Noted  . Hypersomnia  01/12/2016  . Cough 01/12/2016  . Exertional dyspnea 01/12/2016  . Asthma 11/06/2015  . Allergic symptoms 11/06/2015  . Urinary tract infectious disease 09/21/2015  . Syncope and collapse 09/21/2015  . Preventative health care 09/21/2015  . Sleep apnea 09/12/2015  . Pain in the chest 08/28/2015  . Hyperglycemia 07/25/2015  . Hyperlipidemia, mixed 07/25/2015  . History of chicken pox 07/15/2015  . Depression with anxiety 07/15/2015  . HNP (herniated nucleus pulposus), cervical 06/19/2015  .  Left-sided weakness 05/28/2014  . Headache 05/28/2014  . Functional neurological symptom disorder with mixed symptoms 05/19/2012    Past Surgical History:  Procedure Laterality Date  . ABDOMINAL HYSTERECTOMY     TAH b/l SPO, Dr [address]  . ANTERIOR CERVICAL DECOMP/DISCECTOMY FUSION N/A 06/19/2015   Procedure: Removal of Codman anterior cervical plate;   Anterior Cervcial Decompression Fusion Cervical Four-Five;  Surgeon: Shirlean Kelly, MD;  Location: [facility];  Service: Neurosurgery;  Laterality: N/A;  left side approach  . APPENDECTOMY    . BREAST ENHANCEMENT SURGERY    . CHOLECYSTECTOMY    . COLONOSCOPY    . ECTOPIC PREGNANCY SURGERY     X 4 and miscarriage  . ESOPHAGOGASTRODUODENOSCOPY N/A 05/19/2012   Procedure: ESOPHAGOGASTRODUODENOSCOPY (EGD);  Surgeon: Theda Belfast, MD;  Location: [facility] ENDOSCOPY;  Service: Endoscopy;  Laterality: N/A;  . NECK SURGERY    . PEG PLACEMENT      OB History    No data available       Home Medications    Prior to Admission medications   Medication Sig Start Date End Date Taking? Authorizing Provider  albuterol (PROVENTIL HFA;VENTOLIN HFA) 108 (90 Base) MCG/ACT inhaler Inhale 2 puffs into the lungs every 4 (four) hours as needed for wheezing or shortness of breath. 07/08/15   Kristen N Ward, DO  albuterol (PROVENTIL) (2.5 MG/3ML) 0.083% nebulizer solution Take 3 mLs (2.5 mg total) by nebulization every 2 (two) hours as needed for wheezing or shortness of breath. 07/08/15   Kristen N Ward, DO  albuterol (PROVENTIL) (2.5 MG/3ML) 0.083% nebulizer solution Take 3 mLs (2.5 mg total) by nebulization every 6 (six) hours as needed for wheezing or shortness of breath. 12/01/15   Ramon Dredge Saguier, PA-C  aspirin 81 MG tablet Take 81 mg by mouth daily. Reported on 07/15/2015    Historical Provider, MD  benzonatate (TESSALON) 100 MG capsule Take 1 capsule (100 mg total) by mouth 3 (three) times daily as needed for cough. 12/01/15   Ramon Dredge Saguier, PA-C  buPROPion  (WELLBUTRIN XL) 300 MG 24 hr tablet Take 1 tablet (300 mg total) by mouth daily. Taking 150 mg wellbutrin 01/13/16   Lelon Perla Chase, DO  clonazePAM (KLONOPIN) 0.5 MG tablet Take 1 tablet (0.5 mg total) by mouth 2 (two) times daily as needed for anxiety. 01/16/16   Bradd Canary, MD  estradiol (ESTRACE) 1 MG tablet Take 1 mg by mouth daily.    Historical Provider, MD  fenofibrate (TRICOR) 145 MG tablet Take 1 tablet (145 mg total) by mouth daily. 07/18/15   Bradd Canary, MD  fluticasone (FLONASE) 50 MCG/ACT nasal spray Place 2 sprays into both nostrils 2 (two) times daily.    Historical Provider, MD  montelukast (SINGULAIR) 10 MG tablet Take 1 tablet (10 mg total) by mouth at bedtime as needed. 11/06/15   Bradd Canary, MD  Multiple Vitamin (MULTI VITAMIN DAILY PO) Take 1 tablet by mouth daily.     Historical Provider, MD  rizatriptan (  MAXALT) 10 MG tablet Take 1 tablet (10 mg total) by mouth as needed for migraine. May repeat in 2 hours if needed 01/16/16   Bradd Canary, MD  tiZANidine (ZANAFLEX) 4 MG tablet Take 1 tablet (4 mg total) by mouth every 6 (six) hours as needed for muscle spasms. 01/16/16   Bradd Canary, MD  traZODone (DESYREL) 100 MG tablet Take 1-2 tablets (100-200 mg total) by mouth at bedtime. 01/16/16   Bradd Canary, MD    Family History Family History  Problem Relation Age of Onset  . Hypertension Brother   . Alcohol abuse Brother   . Alcohol abuse Mother   . Cancer Mother     pancreatic  . Heart disease Father   . Alcohol abuse Father   . Heart disease Paternal Uncle   . Alcohol abuse Paternal Uncle     Social History Social History  Substance Use Topics  . Smoking status: Former Smoker    Packs/day: 0.50    Years: 30.00    Types: Cigarettes    Quit date: 01/05/2016  . Smokeless tobacco: Never Used  . Alcohol use Yes     Comment: occasional     Allergies   Dilaudid [hydromorphone hcl]; Hydrocodone-homatropine; and Codeine   Review of  Systems Review of Systems  Constitutional: Negative for fever.  Respiratory: Positive for shortness of breath.   Cardiovascular: Positive for chest pain. Negative for leg swelling.  Gastrointestinal: Negative for abdominal pain, blood in stool, constipation and diarrhea.  Genitourinary: Negative for dysuria and frequency.  Neurological: Positive for light-headedness.  All other systems reviewed and are negative.    Physical Exam Updated Vital Signs BP 139/66   Pulse 84   Temp 98 F (36.7 C) (Oral)   Resp 20   Ht 5\' 3"  (1.6 m)   Wt 132 lb (59.9 kg)   SpO2 97%   BMI 23.38 kg/m   Physical Exam  Constitutional: She is oriented to person, place, and time. She appears well-developed and well-nourished. No distress.  HENT:  Head: Normocephalic and atraumatic.  Right Ear: External ear normal.  Left Ear: External ear normal.  Nose: Nose normal.  Mouth/Throat: Oropharynx is clear and moist. No oropharyngeal exudate.  Eyes: Conjunctivae and EOM are normal. Pupils are equal, round, and reactive to light.  Neck: Normal range of motion. Neck supple.  Cardiovascular: Normal rate, regular rhythm, normal heart sounds and intact distal pulses.   No murmur heard. Pulmonary/Chest: Effort normal and breath sounds normal. No stridor. No respiratory distress. She has no wheezes. She exhibits no tenderness.  Abdominal: Bowel sounds are normal. She exhibits no distension. There is no tenderness. There is no rigidity, no rebound, no guarding and no CVA tenderness.    Musculoskeletal: She exhibits tenderness. She exhibits no edema.       Right shoulder: She exhibits tenderness and pain.       Arms: No extremity edema.   Neurological: She is alert and oriented to person, place, and time. She has normal reflexes. She displays no tremor. No sensory deficit. She exhibits abnormal muscle tone. Coordination normal.  Left-sided weakness is reportedly at her baseline. Decreased grip strength on the left.   Skin: Skin is warm. Capillary refill takes less than 2 seconds. No rash noted. She is not diaphoretic. No erythema.  Psychiatric: She has a normal mood and affect.  Nursing note and vitals reviewed.    ED Treatments / Results   DIAGNOSTIC STUDIES: Oxygen Saturation is  97% on RA, normal by my interpretation.    COORDINATION OF CARE: 6:43 PM Discussed treatment plan with pt at bedside which includes labs, chest imaging and EKG and pt agreed to plan.   Labs (all labs ordered are listed, but only abnormal results are displayed) Labs Reviewed  BASIC METABOLIC PANEL - Abnormal; Notable for the following:       Result Value   Creatinine, Ser 1.19 (*)    GFR calc non Af Amer 50 (*)    GFR calc Af Amer 58 (*)    All other components within normal limits  CBC  TROPONIN I  D-DIMER, QUANTITATIVE (NOT AT [facility])  PROTIME-INR  TROPONIN I  TROPONIN I  TROPONIN I  TSH  RAPID URINE DRUG SCREEN, HOSP PERFORMED    EKG  EKG Interpretation  Date/Time:  Thursday February 12 2016 18:06:45 EST Ventricular Rate:  86 PR Interval:  156 QRS Duration: 80 QT Interval:  354 QTC Calculation: 423 R Axis:   68 Text Interpretation:  Normal sinus rhythm Normal ECG When compared to prior, no significant chnges were seen.  No STEMI Confirmed by Rush Landmark MD, Alfonse Garringer [phone]) on 02/12/2016 11:19:07 PM       Radiology Dg Chest 2 View  Result Date: 02/12/2016 CLINICAL DATA:  Two-day history of intermittent chest pain EXAM: CHEST  2 VIEW COMPARISON:  12/09/2015 FINDINGS: The lungs are clear wiithout focal pneumonia, edema, pneumothorax or pleural effusion. The cardiopericardial silhouette is within normal limits for size. The visualized bony structures of the thorax are intact. IMPRESSION: No active cardiopulmonary disease. Electronically Signed   By: Kennith Center M.D.   On: 02/12/2016 19:33    Procedures Procedures (including critical care time)  Medications Ordered in ED Medications  pneumococcal  23 valent vaccine (PNU-IMMUNE) injection 0.5 mL (not administered)  tiZANidine (ZANAFLEX) tablet 4 mg (not administered)  traZODone (DESYREL) tablet 100-200 mg (not administered)  clonazePAM (KLONOPIN) tablet 0.5 mg (not administered)  buPROPion (WELLBUTRIN XL) 24 hr tablet 300 mg (not administered)  fenofibrate tablet 160 mg (not administered)  albuterol (PROVENTIL) (2.5 MG/3ML) 0.083% nebulizer solution 2.5 mg (not administered)  fluticasone (FLONASE) 50 MCG/ACT nasal spray 2 spray (not administered)  aspirin chewable tablet 81 mg (not administered)  enoxaparin (LOVENOX) injection 40 mg (not administered)  morphine 2 MG/ML injection 2 mg (not administered)  gi cocktail (Maalox,Lidocaine,Donnatal) (not administered)  acetaminophen (TYLENOL) tablet 650 mg (not administered)  ondansetron (ZOFRAN) injection 4 mg (not administered)  SUMAtriptan (IMITREX) injection 6 mg (6 mg Subcutaneous Given 02/12/16 2217)     Initial Impression / Assessment and Plan / ED Course  I have reviewed the triage vital signs and the nursing notes.  Pertinent labs & imaging results that were available during my care of the patient were reviewed by me and considered in my medical decision making (see chart for details).     Kayla [name] is a 56 y.o. female with PMHx of an unknown neuromuscular disorder, asthma, CKD, and osteoarthritis who presents to the Emergency Department complaining of moderate, intermittent, centralized chest pain onset two days ago.   History and exam are seen above.  On exam, patient's lungs are clear. No wheezing. No chest tenderness or back tenderness. No abdominal tenderness. Patient has weakness in the left arm she reports is unchanged from prior.  Given patient's report of concerning chest pain including lightheadedness, diaphoresis, nausea, shortness of breath, exertional, and palpitations, patient will have workup to look for etiology of chest pain.  EKG appeared  unchanged from  prior with no evidence of ischemia. D-dimer negative, initial troponin negative, CBC unremarkable, and BMP showed similar to prior. No significant electrolyte abnormality. Asked x-ray revealed no pneumonia.  Despite patient's reassuring workup, patient's story is concerning for cardiac etiology of chest pain. Given patient's history of hospice and her history of diaphragm problems, also considered is a return of her neuromuscular problem causing the shortness of breath.  Patient also developed headache while in the ED and Imitrex was given which helped.  Heart score calculated as a 4.  Patient will be admitted to hospitalist service for further management of chest pain. Neurology was also called and they did not feel that her story sounds consistent with a neuromuscular cause. They will be available for further consultation by the hospitalist team if necessary.  Patient admitted in stable condition to telemetry service for further management of chest pain.   Final Clinical Impressions(s) / ED Diagnoses   Final diagnoses:  Chest pain, unspecified type    I personally performed the services described in this documentation, which was scribed in my presence. The recorded information has been reviewed and is accurate.   Clinical Impression: 1. Chest pain, unspecified type     Disposition: Admit to Hospitalist service     Heide Scales, MD 02/13/16 [phone]

## 2016-02-12 NOTE — Progress Notes (Signed)
This is a no charge note  [facility] Dr[facility] Rush Landmarkegeler  56 -year-old lady with past medical history of hyperlipidemia, asthma, OSA, depression, anxiety, primary lateral sclerosis Kayla [name](Lou Gehrig disease), who presents with chest tightness, dizziness, near syncope, which is worsening with exertion. D-dimer negative, chest x-ray negative, troponin negative, mild acute renal injury, temperature normal, oxygen saturation to 88%, which improved to 98% with nasal cannula oxygen.  Due to concerns that this may be related to primary lateral sclerosis, I asked EDP physician for possibility of transfering patient to other facility. [facility] in the past, but the patient refused to go back to these facility's. EDP discussed with our neurologist, Dr. Otelia LimesLindzen who did not have high suspicion that her current issues is due to primary lateral sclerosis. Pt is accepted to tele bed for obs. Per EDP, if needed, please call back to neurology.  Lorretta HarpXilin Krystine Pabst, MD  [facility] Pager [phone]  If 7PM-7AM, please contact night-coverage [email or web address] Password [email or web address] 02/12/2016, 11:09 PM

## 2016-02-13 ENCOUNTER — Encounter (HOSPITAL_COMMUNITY): Payer: Self-pay | Admitting: Internal Medicine

## 2016-02-13 ENCOUNTER — Encounter (HOSPITAL_COMMUNITY)
Admission: EM | Disposition: A | Discharge status: Home / Self Care | Payer: Self-pay | Source: Home / Self Care | Attending: Emergency Medicine

## 2016-02-13 DIAGNOSIS — E782 Mixed hyperlipidemia: Secondary | ICD-10-CM | POA: Diagnosis not present

## 2016-02-13 DIAGNOSIS — R072 Precordial pain: Secondary | ICD-10-CM

## 2016-02-13 DIAGNOSIS — R0789 Other chest pain: Secondary | ICD-10-CM | POA: Diagnosis not present

## 2016-02-13 DIAGNOSIS — I200 Unstable angina: Secondary | ICD-10-CM

## 2016-02-13 HISTORY — PX: LEFT HEART CATH AND CORONARY ANGIOGRAPHY: CATH118249

## 2016-02-13 LAB — PROTIME-INR
INR: 1.03
PROTHROMBIN TIME: 13.5 s (ref 11.4–15.2)

## 2016-02-13 LAB — RAPID URINE DRUG SCREEN, HOSP PERFORMED
AMPHETAMINES: NOT DETECTED
BENZODIAZEPINES: NOT DETECTED
Barbiturates: NOT DETECTED
Cocaine: NOT DETECTED
OPIATES: NOT DETECTED
TETRAHYDROCANNABINOL: NOT DETECTED

## 2016-02-13 LAB — TROPONIN I
Troponin I: <0.03 ng/mL (ref <0.03)
Troponin I: <0.03 ng/mL (ref <0.03)

## 2016-02-13 LAB — TSH: TSH: 2.504 u[IU]/mL (ref 0.350–4.500)

## 2016-02-13 SURGERY — LEFT HEART CATH AND CORONARY ANGIOGRAPHY
Anesthesia: LOCAL

## 2016-02-13 MED ORDER — TRAZODONE HCL 100 MG PO TABS
100.0000 mg | ORAL_TABLET | Freq: Every day | ORAL | Status: DC
  Administered 2016-02-13: 100 mg via ORAL
  Filled 2016-02-13: qty 1

## 2016-02-13 MED ORDER — CLONAZEPAM 0.5 MG PO TABS
0.5000 mg | ORAL_TABLET | Freq: Two times a day (BID) | ORAL | Status: DC | PRN
  Administered 2016-02-13: 0.5 mg via ORAL
  Filled 2016-02-13: qty 1

## 2016-02-13 MED ORDER — GI COCKTAIL ~~LOC~~: 30.0000 mL | Freq: Four times a day (QID) | ORAL | Status: DC | PRN

## 2016-02-13 MED ORDER — LIDOCAINE HCL (PF) 1 % IJ SOLN
INTRAMUSCULAR | Status: AC
Start: 2016-02-13 — End: 2016-02-13
  Filled 2016-02-13: qty 30

## 2016-02-13 MED ORDER — FENOFIBRATE 160 MG PO TABS: 160.0000 mg | ORAL_TABLET | Freq: Every day | ORAL | Status: DC

## 2016-02-13 MED ORDER — SODIUM CHLORIDE 0.9 % IV SOLN
INTRAVENOUS | Status: DC | PRN
  Administered 2016-02-13: 250 mL via INTRAVENOUS

## 2016-02-13 MED ORDER — HEPARIN SODIUM (PORCINE) 1000 UNIT/ML IJ SOLN
INTRAMUSCULAR | Status: DC | PRN
  Administered 2016-02-13: 3500 [IU] via INTRAVENOUS

## 2016-02-13 MED ORDER — MIDAZOLAM HCL 2 MG/2ML IJ SOLN
INTRAMUSCULAR | Status: DC | PRN
  Administered 2016-02-13: 0.5 mg via INTRAVENOUS

## 2016-02-13 MED ORDER — VERAPAMIL HCL 2.5 MG/ML IV SOLN
INTRAVENOUS | Status: AC
  Filled 2016-02-13: qty 2

## 2016-02-13 MED ORDER — SODIUM CHLORIDE 0.9 % IV SOLN: 250.0000 mL | INTRAVENOUS | Status: DC | PRN

## 2016-02-13 MED ORDER — VERAPAMIL HCL 2.5 MG/ML IV SOLN
INTRAVENOUS | Status: DC | PRN
  Administered 2016-02-13: 10 mL via INTRA_ARTERIAL

## 2016-02-13 MED ORDER — SODIUM CHLORIDE 0.9% FLUSH: 3.0000 mL | Freq: Two times a day (BID) | INTRAVENOUS | Status: DC

## 2016-02-13 MED ORDER — SUMATRIPTAN SUCCINATE 100 MG PO TABS
100.0000 mg | ORAL_TABLET | Freq: Once | ORAL | Status: AC
  Administered 2016-02-13: 100 mg via ORAL
  Filled 2016-02-13: qty 1

## 2016-02-13 MED ORDER — ALBUTEROL SULFATE (2.5 MG/3ML) 0.083% IN NEBU: 2.5000 mg | INHALATION_SOLUTION | Freq: Four times a day (QID) | RESPIRATORY_TRACT | Status: DC | PRN

## 2016-02-13 MED ORDER — HEPARIN (PORCINE) IN NACL 2-0.9 UNIT/ML-% IJ SOLN
INTRAMUSCULAR | Status: AC
  Filled 2016-02-13: qty 1000

## 2016-02-13 MED ORDER — TIZANIDINE HCL 4 MG PO TABS
4.0000 mg | ORAL_TABLET | Freq: Four times a day (QID) | ORAL | Status: DC | PRN
  Filled 2016-02-13: qty 1

## 2016-02-13 MED ORDER — SODIUM CHLORIDE 0.9% FLUSH: 3.0000 mL | INTRAVENOUS | Status: DC | PRN

## 2016-02-13 MED ORDER — IOPAMIDOL (ISOVUE-370) INJECTION 76%
INTRAVENOUS | Status: DC | PRN
  Administered 2016-02-13: 75 mL via INTRA_ARTERIAL

## 2016-02-13 MED ORDER — ENOXAPARIN SODIUM 40 MG/0.4ML ~~LOC~~ SOLN: 40.0000 mg | SUBCUTANEOUS | Status: DC

## 2016-02-13 MED ORDER — IOPAMIDOL (ISOVUE-370) INJECTION 76%
INTRAVENOUS | Status: AC
  Filled 2016-02-13: qty 100

## 2016-02-13 MED ORDER — SODIUM CHLORIDE 0.9 % IV SOLN
INTRAVENOUS | Status: DC
  Administered 2016-02-13: 16:00:00 via INTRAVENOUS

## 2016-02-13 MED ORDER — ASPIRIN 81 MG PO CHEW: 81.0000 mg | CHEWABLE_TABLET | ORAL | Status: DC

## 2016-02-13 MED ORDER — ACETAMINOPHEN 325 MG PO TABS
650.0000 mg | ORAL_TABLET | ORAL | Status: DC | PRN
  Administered 2016-02-13: 650 mg via ORAL
  Filled 2016-02-13: qty 2

## 2016-02-13 MED ORDER — HEPARIN (PORCINE) IN NACL 2-0.9 UNIT/ML-% IJ SOLN
INTRAMUSCULAR | Status: DC | PRN
  Administered 2016-02-13: 1000 mL

## 2016-02-13 MED ORDER — FENTANYL CITRATE (PF) 100 MCG/2ML IJ SOLN
INTRAMUSCULAR | Status: DC | PRN
  Administered 2016-02-13: 25 ug via INTRAVENOUS

## 2016-02-13 MED ORDER — MIDAZOLAM HCL 2 MG/2ML IJ SOLN
INTRAMUSCULAR | Status: AC
  Filled 2016-02-13: qty 2

## 2016-02-13 MED ORDER — LIDOCAINE HCL (PF) 1 % IJ SOLN
INTRAMUSCULAR | Status: DC | PRN
  Administered 2016-02-13: 2 mL

## 2016-02-13 MED ORDER — ASPIRIN 81 MG PO CHEW
81.0000 mg | CHEWABLE_TABLET | Freq: Every day | ORAL | Status: DC
  Administered 2016-02-13: 81 mg via ORAL
  Filled 2016-02-13: qty 1

## 2016-02-13 MED ORDER — BUPROPION HCL ER (XL) 150 MG PO TB24: 300.0000 mg | ORAL_TABLET | Freq: Every day | ORAL | Status: DC

## 2016-02-13 MED ORDER — SODIUM CHLORIDE 0.9 % WEIGHT BASED INFUSION: 3.0000 mL/kg/h | INTRAVENOUS | Status: DC

## 2016-02-13 MED ORDER — PNEUMOCOCCAL VAC POLYVALENT 25 MCG/0.5ML IJ INJ: 0.5000 mL | INJECTION | INTRAMUSCULAR | Status: DC

## 2016-02-13 MED ORDER — SODIUM CHLORIDE 0.9 % WEIGHT BASED INFUSION: 1.0000 mL/kg/h | INTRAVENOUS | Status: DC

## 2016-02-13 MED ORDER — FENTANYL CITRATE (PF) 100 MCG/2ML IJ SOLN
INTRAMUSCULAR | Status: AC
  Filled 2016-02-13: qty 2

## 2016-02-13 MED ORDER — FLUTICASONE PROPIONATE 50 MCG/ACT NA SUSP
2.0000 | Freq: Two times a day (BID) | NASAL | Status: DC
  Filled 2016-02-13: qty 16

## 2016-02-13 MED ORDER — MORPHINE SULFATE (PF) 2 MG/ML IV SOLN
2.0000 mg | INTRAVENOUS | Status: DC | PRN
  Administered 2016-02-13 (×2): 2 mg via INTRAVENOUS
  Filled 2016-02-13 (×2): qty 1

## 2016-02-13 MED ORDER — ONDANSETRON HCL 4 MG/2ML IJ SOLN
4.0000 mg | Freq: Four times a day (QID) | INTRAMUSCULAR | Status: DC | PRN
  Administered 2016-02-13: 4 mg via INTRAVENOUS
  Filled 2016-02-13 (×2): qty 2

## 2016-02-13 SURGICAL SUPPLY — 12 items
CATH EXPO 5F FL3.5 (CATHETERS) ×2 IMPLANT
CATH EXPO 5FR ANG PIGTAIL 145 (CATHETERS) ×2 IMPLANT
CATH EXPO 5FR FR4 (CATHETERS) ×2 IMPLANT
DEVICE RAD COMP TR BAND LRG (VASCULAR PRODUCTS) ×2 IMPLANT
GLIDESHEATH SLEND SS 6F .021 (SHEATH) ×2 IMPLANT
GUIDEWIRE INQWIRE 1.5J.035X260 (WIRE) ×1 IMPLANT
INQWIRE 1.5J .035X260CM (WIRE) ×2
KIT HEART LEFT (KITS) ×2 IMPLANT
PACK CARDIAC CATHETERIZATION (CUSTOM PROCEDURE TRAY) ×2 IMPLANT
SYR MEDRAD MARK V 150ML (SYRINGE) ×2 IMPLANT
TRANSDUCER W/STOPCOCK (MISCELLANEOUS) ×2 IMPLANT
TUBING CIL FLEX 10 FLL-RA (TUBING) ×2 IMPLANT

## 2016-02-13 NOTE — Discharge Summary (Signed)
Physician Discharge Summary  Kayla [name] ZOX:[id number] DOB: 1960/09/18 DOA: 02/12/2016  PCP: Danise Edge, MD  Admit date: 02/12/2016 Discharge date: 02/13/2016  Time spent:45 minutes  Recommendations for Outpatient Follow-up:  1. PCP in 1 week   Discharge Diagnoses:  Principal Problem:   Chest tightness Active Problems:   Functional neurological symptom disorder with mixed symptoms   Depression with anxiety   Hyperlipidemia, mixed   Discharge Condition: stable  Diet recommendation: heart healthy  Filed Weights   02/12/16 1801 02/13/16 0030 02/13/16 0500  Weight: 59.9 kg (132 lb) 62.3 kg (137 lb 6.4 oz) 62.4 kg (137 lb 9.6 oz)    History of present illness:  55/F with history of neuro- muscular disorder, , tobacco abuse, dyslipidemia and strong family history of premature CAD was admitted with  progressive chest pain concerning for unstable angina.  Hospital Course:   Chest pain -symptoms were concerning for unstable angina -troponin's negative, cardiology consulted, underwent a left heart cath which was normal, d-dimer negative too. -discharged home in a stable condition, symptoms resolved -its possible this was muscular in origin  HLD -Continue tricor  Neurologic condition -Truly a remarkable story that she was thought to have ALS, placed on Hospice, and has mostly recovered -Ongoing left-sided weakness, foot drop, and balance issues -FU with Neurology  Depression/anxiety -Continue home meds: Wellbutrin, Klonopin, Trazadone  Procedures:   Consultations:  Left Heart cath  Discharge Exam: Vitals:   02/13/16 1450 02/13/16 1455  BP: (!) 98/52 (!) 97/56  Pulse: 77 (!) 0  Resp: 17 13  Temp:      General: AAOx3 Cardiovascular: S1S2/RRR Respiratory: CTAB  Discharge Instructions   Discharge Instructions    Diet - low sodium heart healthy    Complete by:  As directed    Increase activity slowly    Complete by:  As directed      Current  Discharge Medication List    CONTINUE these medications which have NOT CHANGED   Details  albuterol (PROVENTIL HFA;VENTOLIN HFA) 108 (90 Base) MCG/ACT inhaler Inhale 2 puffs into the lungs every 4 (four) hours as needed for wheezing or shortness of breath. Qty: 1 Inhaler, Refills: 1    aspirin 81 MG tablet Take 81 mg by mouth daily. Reported on 07/15/2015    buPROPion (WELLBUTRIN XL) 300 MG 24 hr tablet Take 1 tablet (300 mg total) by mouth daily. Taking 150 mg wellbutrin Qty: 30 tablet, Refills: 3    clonazePAM (KLONOPIN) 0.5 MG tablet Take 1 tablet (0.5 mg total) by mouth 2 (two) times daily as needed for anxiety. Qty: 60 tablet, Refills: 1    estradiol (ESTRACE) 1 MG tablet Take 1 mg by mouth daily.    fenofibrate (TRICOR) 145 MG tablet Take 1 tablet (145 mg total) by mouth daily. Qty: 30 tablet, Refills: 3    fluticasone (FLONASE) 50 MCG/ACT nasal spray Place 2 sprays into both nostrils 2 (two) times daily.    ibuprofen (ADVIL,MOTRIN) 200 MG tablet Take 600 mg by mouth every 6 (six) hours as needed for headache or moderate pain.    Multiple Vitamin (MULTI VITAMIN DAILY PO) Take 1 tablet by mouth daily.     nicotine polacrilex (NICOTINE MINI) 4 MG lozenge Take 4 mg by mouth as needed for smoking cessation.    rizatriptan (MAXALT) 10 MG tablet Take 1 tablet (10 mg total) by mouth as needed for migraine. May repeat in 2 hours if needed Qty: 10 tablet, Refills: 2    tiZANidine (ZANAFLEX) 4  MG tablet Take 1 tablet (4 mg total) by mouth every 6 (six) hours as needed for muscle spasms. Qty: 90 tablet, Refills: 1    traZODone (DESYREL) 100 MG tablet Take 1-2 tablets (100-200 mg total) by mouth at bedtime. Qty: 180 tablet, Refills: 1    albuterol (PROVENTIL) (2.5 MG/3ML) 0.083% nebulizer solution Take 3 mLs (2.5 mg total) by nebulization every 6 (six) hours as needed for wheezing or shortness of breath. Qty: 150 mL, Refills: 1    benzonatate (TESSALON) 100 MG capsule Take 1 capsule  (100 mg total) by mouth 3 (three) times daily as needed for cough. Qty: 21 capsule, Refills: 0      STOP taking these medications     montelukast (SINGULAIR) 10 MG tablet        Allergies  Allergen Reactions  . Dilaudid [Hydromorphone Hcl] Itching and Other (See Comments)    Can take with Benadryl  . Hydrocodone-Homatropine Nausea And Vomiting  . Codeine Nausea And Vomiting, Rash and Other (See Comments)    Patient hasn't had it Codeine She doesn't think she has an existing allergy   Follow-up Information    BLYTH, Misty StanleySTACEY, MD. Schedule an appointment as soon as possible for a visit in 1 week(s).   Specialty:  Family Medicine Contact information: [address] [place] [address] [phone]            The results of significant diagnostics from this hospitalization (including imaging, microbiology, ancillary and laboratory) are listed below for reference.    Significant Diagnostic Studies: Dg Chest 2 View  Result Date: 02/12/2016 CLINICAL DATA:  Two-day history of intermittent chest pain EXAM: CHEST  2 VIEW COMPARISON:  12/09/2015 FINDINGS: The lungs are clear wiithout focal pneumonia, edema, pneumothorax or pleural effusion. The cardiopericardial silhouette is within normal limits for size. The visualized bony structures of the thorax are intact. IMPRESSION: No active cardiopulmonary disease. Electronically Signed   By: Kennith CenterEric  Mansell M.D.   On: 02/12/2016 19:33    Microbiology: No results found for this or any previous visit (from the past 240 hour(s)).   Labs: Basic Metabolic Panel:  Recent Labs Lab 02/12/16 1825  NA 138  K 3.6  CL 105  CO2 26  GLUCOSE 89  BUN 14  CREATININE 1.19*  CALCIUM 9.6   Liver Function Tests: No results for input(s): AST, ALT, ALKPHOS, BILITOT, PROT, ALBUMIN in the last 168 hours. No results for input(s): LIPASE, AMYLASE in the last 168 hours. No results for input(s): AMMONIA in the last 168  hours. CBC:  Recent Labs Lab 02/12/16 1825  WBC 6.3  HGB 12.4  HCT 37.4  MCV 90.8  PLT 287   Cardiac Enzymes:  Recent Labs Lab 02/12/16 1825 02/13/16 0330 02/13/16 0746  TROPONINI <0.03 <0.03 <0.03   BNP: BNP (last 3 results) No results for input(s): BNP in the last 8760 hours.  ProBNP (last 3 results) No results for input(s): PROBNP in the last 8760 hours.  CBG: No results for input(s): GLUCAP in the last 168 hours.     SignedZannie Cove:  Mikias Lanz MD.  [facility] 02/13/2016, 3:40 PM

## 2016-02-13 NOTE — H&P (Signed)
History and Physical    Kayla [name] ZOX:[id number] DOB: 07/25/1960 DOA: 02/12/2016  PCP: Danise Edge, MD Consultants:  Christene Slates - pulmonology; Jule Ser - neurosurgery Patient coming from: home - lives with husband; NOK: husband, [phone]  Chief Complaint: chest pressure  HPI: Kayla [name] is a 56 y.o. female with medical history significant of unusual progressive neurologic condition that was thought to be ALS and then PLS but eventually mostly resolved; HLD not on statins due to severe mylagias; possible OSA not on CPAP; and depression with anxiety presenting with chest tightness.  This whole week, she has been feeling tightness in the center of her chest.   Pressure comes and goes.  She thought it be muscular.  Today about 530pm, she was cleaning at the house when it started, felt very tired and lightheaded.  She was also very SOB.  Symptoms improved but did not resolve with rest.  Was looking at the TV and it became more blurry.  Also felt last night like her heart was beating out of her chest.  Left-sided neck pain into her back, this is atypical for her.  Had a stress test when she was in her 30s.    Does not feel rested when she wakes in the AM for some time, feels more tired than she thinks other people do.  Has h/o diaphragm issues, restrictive lung disease.  Due for sleep sutdy on 2/23.  Also wakes up with a headache each morning.  Has had a lot of stress in her life. Thought the stress attacked her body.  Diagnosed with ALS in the past, placed into Hospice, thought she was dying, but worked very hard to improve, got better, and eventually got discharged from Hospice.  Still has left-sided weakness, balance issues.  Has left foot drop.  Had suprapubic cath and G-tube, eventually weaned off these things.  Intermittently self-caths, but has not had to do this in some time.   ED Course: Per Dr. Rush Landmark: Kayla [name] is a 56 y.o. female with PMHx of an unknown neuromuscular disorder, asthma,  CKD, and osteoarthritis who presents to the Emergency Department complaining of moderate, intermittent, centralized chest pain onset two days ago.  History and exam are seen above.  On exam, patient's lungs are clear. No wheezing. No chest tenderness or back tenderness. No abdominal tenderness. Patient has weakness in the left arm she reports is unchanged from prior.  Given patient's report of concerning chest pain including lightheadedness, diaphoresis, nausea, shortness of breath, exertional, and palpitations, patient will have workup to look for etiology of chest pain.  EKG appeared unchanged from prior with no evidence of ischemia. D-dimer negative, initial troponin negative, CBC unremarkable, and BMP showed similar to prior. No significant electrolyte abnormality. Asked x-ray revealed no pneumonia.  Despite patient's reassuring workup, patient's story is concerning for cardiac etiology of chest pain. Given patient's history of hospice and her history of diaphragm problems, also considered is a return of her neuromuscular problem causing the shortness of breath.  Patient also developed headache while in the ED and Imitrex was given which helped.  Heart score calculated as a 4.  Patient will be admitted to hospitalist service for further management of chest pain. Neurology was also called and they did not feel that her story sounds consistent with a neuromuscular cause. They will be available for further consultation by the hospitalist team if necessary.  Patient admitted in stable condition to telemetry service for further management of chest pain.   Review  of Systems: As per HPI; otherwise 10 point review of systems reviewed and negative.   Ambulatory Status:  Exercises regularly, does not need assistance  Past Medical History:  Diagnosis Date  . Allergic symptoms 11/06/2015  . Anxiety   . Asthma 11/06/2015  . Chronic kidney disease    pt stated "I have Stage 3 kidney disease"  .  Complication of anesthesia    Pt stated "sometimes I have trouble waking up"  . Constipation due to pain medication   . DDD (degenerative disc disease), cervical   . Depression with anxiety 07/15/2015  . Functional neurological symptom disorder with mixed symptoms 05/19/2012   2007  Started then was tripping up steps. 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 Left foot drop but has worked hard not to need AFO and is stronger now.     . G tube feedings (HCC)   . Glaucoma   . History of chicken pox 07/15/2015  . Hyperlipidemia, mixed 07/25/2015  . Migraine    Takes Imitrex  . Osteoarthritis of back   . Preventative health care 09/21/2015  . Primary lateral sclerosis   . RLL pneumonia (HCC) 07/15/2015  . Shortness of breath dyspnea    with exertion  . Sleep apnea 09/12/2015   H/o Bipap and VPAP use in past    Past Surgical History:  Procedure Laterality Date  . ABDOMINAL HYSTERECTOMY     TAH b/l SPO, Dr Loreta AveWagner  . ANTERIOR CERVICAL DECOMP/DISCECTOMY FUSION N/A 06/19/2015   Procedure: Removal of Codman anterior cervical plate;   Anterior Cervcial Decompression Fusion Cervical Four-Five;  Surgeon: Shirlean Kellyobert Nudelman, MD;  Location: [facility];  Service: Neurosurgery;  Laterality: N/A;  left side approach  . APPENDECTOMY    . BREAST ENHANCEMENT SURGERY    . CHOLECYSTECTOMY    . COLONOSCOPY    . ECTOPIC PREGNANCY SURGERY     X 4 and miscarriage  . ESOPHAGOGASTRODUODENOSCOPY N/A 05/19/2012   Procedure: ESOPHAGOGASTRODUODENOSCOPY (EGD);  Surgeon: Theda BelfastPatrick D Hung, MD;  [facility];  Service: Endoscopy;  Laterality: N/A;  . NECK SURGERY    . PEG PLACEMENT      Social History   Social History  . Marital status: Married    Spouse name: N/A  . Number of children: N/A  . Years of education: college   Occupational History  . 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 Smoking status: Former Smoker    Packs/day: 0.50     Years: 30.00    Types: Cigarettes    Quit date: 01/05/2016  . Smokeless tobacco: Never Used  . Alcohol use Yes     Comment: occasional  . Drug use: No  . Sexual activity: Yes    Birth control/ protection: Surgical   Other Topics Concern  . Not on file   Social History Narrative   Lives with boyfriend, widowed, then divorced, remarried 2017. No major dietary restrictions. 1/2 ppd, drinks a small amount socially    Allergies  Allergen Reactions  . Dilaudid [Hydromorphone Hcl] Itching    Can take with Benadryl  . Hydrocodone-Homatropine Nausea And Vomiting  . Codeine Nausea And Vomiting and Rash    Patient hasn't had it Codeine She doesn't think she has an existing allergy    Family History  Problem Relation Age of Onset  . Hypertension Brother   . Alcohol abuse Brother   . Alcohol abuse  Mother   . Cancer Mother     pancreatic  . Heart disease Father   . Alcohol abuse Father   . Heart disease Paternal Uncle   . Alcohol abuse Paternal Uncle     Prior to Admission medications   Medication Sig Start Date End Date Taking? Authorizing Provider  albuterol (PROVENTIL HFA;VENTOLIN HFA) 108 (90 Base) MCG/ACT inhaler Inhale 2 puffs into the lungs every 4 (four) hours as needed for wheezing or shortness of breath. 07/08/15   Kristen N Ward, DO  albuterol (PROVENTIL) (2.5 MG/3ML) 0.083% nebulizer solution Take 3 mLs (2.5 mg total) by nebulization every 2 (two) hours as needed for wheezing or shortness of breath. 07/08/15   Kristen N Ward, DO  albuterol (PROVENTIL) (2.5 MG/3ML) 0.083% nebulizer solution Take 3 mLs (2.5 mg total) by nebulization every 6 (six) hours as needed for wheezing or shortness of breath. 12/01/15   Ramon Dredge Saguier, PA-C  aspirin 81 MG tablet Take 81 mg by mouth daily. Reported on 07/15/2015    Historical Provider, MD  benzonatate (TESSALON) 100 MG capsule Take 1 capsule (100 mg total) by mouth 3 (three) times daily as needed for cough. 12/01/15   Ramon Dredge Saguier, PA-C    buPROPion (WELLBUTRIN XL) 300 MG 24 hr tablet Take 1 tablet (300 mg total) by mouth daily. Taking 150 mg wellbutrin 01/13/16   Lelon Perla Chase, DO  clonazePAM (KLONOPIN) 0.5 MG tablet Take 1 tablet (0.5 mg total) by mouth 2 (two) times daily as needed for anxiety. 01/16/16   Bradd Canary, MD  estradiol (ESTRACE) 1 MG tablet Take 1 mg by mouth daily.    Historical Provider, MD  fenofibrate (TRICOR) 145 MG tablet Take 1 tablet (145 mg total) by mouth daily. 07/18/15   Bradd Canary, MD  fluticasone (FLONASE) 50 MCG/ACT nasal spray Place 2 sprays into both nostrils 2 (two) times daily.    Historical Provider, MD  montelukast (SINGULAIR) 10 MG tablet Take 1 tablet (10 mg total) by mouth at bedtime as needed. 11/06/15   Bradd Canary, MD  Multiple Vitamin (MULTI VITAMIN DAILY PO) Take 1 tablet by mouth daily.     Historical Provider, MD  rizatriptan (MAXALT) 10 MG tablet Take 1 tablet (10 mg total) by mouth as needed for migraine. May repeat in 2 hours if needed 01/16/16   Bradd Canary, MD  tiZANidine (ZANAFLEX) 4 MG tablet Take 1 tablet (4 mg total) by mouth every 6 (six) hours as needed for muscle spasms. 01/16/16   Bradd Canary, MD  traZODone (DESYREL) 100 MG tablet Take 1-2 tablets (100-200 mg total) by mouth at bedtime. 01/16/16   Bradd Canary, MD    Physical Exam: Vitals:   02/12/16 2219 02/12/16 2300 02/13/16 0030 02/13/16 0037  BP: 121/60 (!) 112/47  114/80  Pulse: 73 75    Resp: 18 16  16   Temp:    98.1 F (36.7 C)  TempSrc:    Oral  SpO2: 98% 96%  99%  Weight:   62.3 kg (137 lb 6.4 oz)   Height:   5\' 3"  (1.6 m)      General: Appears calm and comfortable and is NAD Eyes:  EOMI, normal lids, iris ENT:  grossly normal hearing, lips & tongue, mmm Neck:  no LAD, masses or thyromegaly Cardiovascular:  RRR, no m/r/g. No LE edema.  Respiratory:  CTA bilaterally, no w/r/r. Normal respiratory effort. Abdomen:  soft, ntnd, NABS Skin:  no rash or induration  seen on limited  exam Musculoskeletal:  grossly normal tone BUE/BLE, good ROM, no bony abnormality Psychiatric:  anxious mood and affect, speech fluent and appropriate, AOx3 Neurologic:  CN 2-12 grossly intact, moves all extremities in coordinated fashion, sensation intact  Labs on Admission: I have personally reviewed following labs and imaging studies  CBC:  Recent Labs Lab 02/12/16 1825  WBC 6.3  HGB 12.4  HCT 37.4  MCV 90.8  PLT 287   Basic Metabolic Panel:  Recent Labs Lab 02/12/16 1825  NA 138  K 3.6  CL 105  CO2 26  GLUCOSE 89  BUN 14  CREATININE 1.19*  CALCIUM 9.6   GFR: Estimated Creatinine Clearance: 44.2 mL/min (by C-G formula based on SCr of 1.19 mg/dL (H)). Liver Function Tests: No results for input(s): AST, ALT, ALKPHOS, BILITOT, PROT, ALBUMIN in the last 168 hours. No results for input(s): LIPASE, AMYLASE in the last 168 hours. No results for input(s): AMMONIA in the last 168 hours. Coagulation Profile:  Recent Labs Lab 02/12/16 1859  INR 0.99   Cardiac Enzymes:  Recent Labs Lab 02/12/16 1825  TROPONINI <0.03   BNP (last 3 results) No results for input(s): PROBNP in the last 8760 hours. HbA1C: No results for input(s): HGBA1C in the last 72 hours. CBG: No results for input(s): GLUCAP in the last 168 hours. Lipid Profile: No results for input(s): CHOL, HDL, LDLCALC, TRIG, CHOLHDL, LDLDIRECT in the last 72 hours. Thyroid Function Tests: No results for input(s): TSH, T4TOTAL, FREET4, T3FREE, THYROIDAB in the last 72 hours. Anemia Panel: No results for input(s): VITAMINB12, FOLATE, FERRITIN, TIBC, IRON, RETICCTPCT in the last 72 hours. Urine analysis:    Component Value Date/Time   COLORURINE YELLOW 09/12/2015 1113   APPEARANCEUR Sl Cloudy (A) 09/12/2015 1113   LABSPEC 1.010 09/12/2015 1113   PHURINE 5.5 09/12/2015 1113   GLUCOSEU NEGATIVE 09/12/2015 1113   HGBUR TRACE-LYSED (A) 09/12/2015 1113   BILIRUBINUR NEGATIVE 09/12/2015 1113   KETONESUR  NEGATIVE 09/12/2015 1113   PROTEINUR NEGATIVE 05/27/2014 2325   UROBILINOGEN 0.2 09/12/2015 1113   NITRITE NEGATIVE 09/12/2015 1113   LEUKOCYTESUR LARGE (A) 09/12/2015 1113    Creatinine Clearance: Estimated Creatinine Clearance: 44.2 mL/min (by C-G formula based on SCr of 1.19 mg/dL (H)).  Sepsis Labs: @LABRCNTIP (procalcitonin:4,lacticidven:4) )No results found for this or any previous visit (from the past 240 hour(s)).   Radiological Exams on Admission: Dg Chest 2 View  Result Date: 02/12/2016 CLINICAL DATA:  Two-day history of intermittent chest pain EXAM: CHEST  2 VIEW COMPARISON:  12/09/2015 FINDINGS: The lungs are clear wiithout focal pneumonia, edema, pneumothorax or pleural effusion. The cardiopericardial silhouette is within normal limits for size. The visualized bony structures of the thorax are intact. IMPRESSION: No active cardiopulmonary disease. Electronically Signed   By: Kennith Center M.D.   On: 02/12/2016 19:33    EKG: Independently reviewed.  NSR with rate 86; normal EKG  Assessment/Plan Principal Problem:   Chest tightness Active Problems:   Functional neurological symptom disorder with mixed symptoms   Depression with anxiety   Hyperlipidemia, mixed   Chest pain -Patient with substernal chest pressure that has been present for days intermittently, at least 1 episode was exertional, +/- improved with rest. -2-3/3 typical symptoms suggestive of atypical chest pain vs.unstable angina.  -CXR unremarkable.   -Initial cardiac troponin normal; this would be expected to be positive for ACS given the duration of her symptoms. -EKG negative.   -+FH. -Negative D-dimer. -TIMI risk score is 2; which  predicts a 14 days risk of death, recurrent MI, or urgent revascularization of 8.3%.  -Will plan to place in observation status on telemetry to rule out ACS by overnight observation.  -cycle troponin q6h x 3 and repeat EKG in AM -Continue ASA 81 mg daily -morphine  given -cannot take statins -Risk factor stratification with HgbA1c not needed with normal glucose; had recent FLP; will check TSH and UDS -Cardiology consultation in AM (inbox message sent to [employer]) - NPO for possible stress test  -Will plan to start Heparin drip if her enzymes are positive and/or chest pain recurs -Hold Estrace  HLD -Continue tricor -Unable to tolerate statins -Lipids 11/06/15 - TC 181, HDL 74.30, LDL 92, TG 76; will not repeat at this time  Neurologic condition -Truly a remarkable story that she was thought to have ALS, placed on Hospice, and has mostly recovered -Ongoing left-sided weakness, foot drop, and balance issues  Depression/anxiety -Continue home meds: Wellbutrin, Klonopin, Trazadone   DVT prophylaxis: Lovenox  Code Status:  Full - confirmed with patient/family Family Communication: Husband present throughout evaluation Disposition Plan: Home once clinically improved Consults called: Cardiology - via inbox message  Admission status: It is my clinical opinion that referral for OBSERVATION is reasonable and necessary in this patient based on the above information provided. The aforementioned taken together are felt to place the patient at high risk for further clinical deterioration. However it is anticipated that the patient may be medically stable for discharge from the hospital within 24 to 48 hours.    Jonah Blue MD [facility]  If 7PM-7AM, please contact night-coverage [email or web address] Password [email or web address]  02/13/2016, 1:52 AM

## 2016-02-13 NOTE — Care Management Note (Signed)
Case Management Note  Patient Details  Name: Kayla [name] MRN: [id number] Date of Birth: Feb 18, 1960  Subjective/Objective:  Pt presented with Chest Pain. Plan for cardiac cath 02-13-16. PTA pt was independent. Pt will be to return home once stable.                   Action/Plan: No needs identified by CM at this time.   Expected Discharge Date:                  Expected Discharge Plan:  Home/Self Care  In-House Referral:  NA  Discharge planning Services  NA  Post Acute Care Choice:  NA Choice offered to:  NA  DME Arranged:  N/A DME Agency:  NA  HH Arranged:  NA HH Agency:  NA  Status of Service:  Completed, signed off  If discussed at Long Length of Stay Meetings, dates discussed:    Additional Comments:  Gala LewandowskyGraves-Bigelow, Kamyra Schroeck Kaye, RN 02/13/2016, 10:17 AM

## 2016-02-13 NOTE — Consult Note (Signed)
Reason for Consult: chest pressure   Referring Physician: Dr. Lorin Mercy    PCP:  Penni Homans, MD  Primary Cardiologist: new  Kayla [name] is an 56 y.o. female.    Chief Complaint:  Presented to Er with chest tightness for a week.  Admitted to rule out and possible nuc study.   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   She has been having chest tightness for a week.  It would come and go.  No associated symptoms of nausea- though she does have chronic nausea - + SOB that was severe yesterday with neg D Dimer.  Yesterday she became very tired and lightheaded and SOB,  She rested improvement of symptoms but not resolved.  + pounding heart beat that is rapid as well.   Remote stress test.    Complex hx from neuro perspective per Dr. Lorin Mercy "Diagnosed with ALS in the past, placed into Hospice, thought she was dying, but worked very hard to improve, got better, and eventually got discharged from Hospice.  Still has left-sided weakness, balance issues.  Has left foot drop.  Had suprapubic cath and G-tube, eventually weaned off these things.  Intermittently self-caths, but has not had to do this in some time."  Her EKG SR no acute changes, normal EKG,  Troponins have been negative X 2  D-Dimer negative, CXR without acute process.   Pt anxious about Cardiac source of tightness and SOB.   Currently she felt well when she got up this AM but on the toilet the pressure returned.   Her father's family aunts uncle and grandparents had premature CAD and were worked up at [employer].  She is anxious.  She stopped tobacco Jan first of this year had been half a ppd tobacco user.  Lot of dizziness as well.   No fevers - she did have some diarrhea 2 days ago.   Past Medical History:    Diagnosis Date  . Allergic symptoms 11/06/2015  . Anxiety   . Asthma 11/06/2015  . Chronic kidney disease    pt stated "I have Stage 3 kidney disease"  . Complication of anesthesia    Pt stated "sometimes I have trouble waking up"  . Constipation due to pain medication   . DDD (degenerative disc disease), cervical   . Depression with anxiety 07/15/2015  . Functional neurological symptom disorder with mixed symptoms 05/19/2012   2007  Started then was tripping up steps. 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 Left foot drop but has worked hard not to need AFO and is stronger now.     . G tube feedings ([place])   . Glaucoma   . History of chicken pox 07/15/2015  . Hyperlipidemia, mixed 07/25/2015  . Migraine    Takes Imitrex  . Osteoarthritis of back   . Preventative health care 09/21/2015  . Primary lateral sclerosis   . RLL pneumonia ([place]) 07/15/2015  . Shortness of breath dyspnea    with exertion  . Sleep apnea 09/12/2015   H/o Bipap and VPAP use in past    Past Surgical History:  Procedure Laterality Date  . ABDOMINAL  HYSTERECTOMY     TAH b/l SPO, Dr Earleen Newport  . ANTERIOR CERVICAL DECOMP/DISCECTOMY FUSION N/A 06/19/2015   Procedure: Removal of Codman anterior cervical plate;   Anterior Cervcial Decompression Fusion Cervical Four-Five;  Surgeon: Jovita Gamma, MD;  Location: [place];  Service: Neurosurgery;  Laterality: N/A;  left side approach  . APPENDECTOMY    . BREAST ENHANCEMENT SURGERY    . CHOLECYSTECTOMY    . COLONOSCOPY    . ECTOPIC PREGNANCY SURGERY     X 4 and miscarriage  . ESOPHAGOGASTRODUODENOSCOPY N/A 05/19/2012   Procedure: ESOPHAGOGASTRODUODENOSCOPY (EGD);  Surgeon: Beryle Beams, MD;  Location: [facility];  Service: Endoscopy;  Laterality: N/A;  . NECK SURGERY    . PEG PLACEMENT      Family History  Problem Relation Age of Onset  . Hypertension Brother   . Alcohol abuse Brother   . Alcohol abuse Mother   . Cancer  Mother     pancreatic  . Heart disease Father   . Alcohol abuse Father   . Heart disease Paternal Uncle   . Alcohol abuse Paternal Uncle    Social History:  reports that she quit smoking about 5 weeks ago. Her smoking use included Cigarettes. She has a 15.00 pack-year smoking history. She has never used smokeless tobacco. She reports that she drinks alcohol. She reports that she does not use drugs.  Allergies:  Allergies  Allergen Reactions  . Dilaudid [Hydromorphone Hcl] Itching    Can take with Benadryl  . Hydrocodone-Homatropine Nausea And Vomiting  . Codeine Nausea And Vomiting and Rash    Patient hasn't had it Codeine She doesn't think she has an existing allergy    OUTPATIENT MEDICATIONS: No current facility-administered medications on file prior to encounter.    Current Outpatient Prescriptions on File Prior to Encounter  Medication Sig Dispense Refill  . albuterol (PROVENTIL HFA;VENTOLIN HFA) 108 (90 Base) MCG/ACT inhaler Inhale 2 puffs into the lungs every 4 (four) hours as needed for wheezing or shortness of breath. 1 Inhaler 1  . albuterol (PROVENTIL) (2.5 MG/3ML) 0.083% nebulizer solution Take 3 mLs (2.5 mg total) by nebulization every 6 (six) hours as needed for wheezing or shortness of breath. 150 mL 1  . aspirin 81 MG tablet Take 81 mg by mouth daily. Reported on 07/15/2015    . benzonatate (TESSALON) 100 MG capsule Take 1 capsule (100 mg total) by mouth 3 (three) times daily as needed for cough. 21 capsule 0  . buPROPion (WELLBUTRIN XL) 300 MG 24 hr tablet Take 1 tablet (300 mg total) by mouth daily. Taking 150 mg wellbutrin 30 tablet 3  . clonazePAM (KLONOPIN) 0.5 MG tablet Take 1 tablet (0.5 mg total) by mouth 2 (two) times daily as needed for anxiety. 60 tablet 1  . estradiol (ESTRACE) 1 MG tablet Take 1 mg by mouth daily.    . fenofibrate (TRICOR) 145 MG tablet Take 1 tablet (145 mg total) by mouth daily. 30 tablet 3  . fluticasone (FLONASE) 50 MCG/ACT nasal spray  Place 2 sprays into both nostrils 2 (two) times daily.    . montelukast (SINGULAIR) 10 MG tablet Take 1 tablet (10 mg total) by mouth at bedtime as needed. 30 tablet 3  . Multiple Vitamin (MULTI VITAMIN DAILY PO) Take 1 tablet by mouth daily.     . rizatriptan (MAXALT) 10 MG tablet Take 1 tablet (10 mg total) by mouth as needed for migraine. May repeat in 2 hours if needed 10 tablet 2  .  tiZANidine (ZANAFLEX) 4 MG tablet Take 1 tablet (4 mg total) by mouth every 6 (six) hours as needed for muscle spasms. 90 tablet 1  . traZODone (DESYREL) 100 MG tablet Take 1-2 tablets (100-200 mg total) by mouth at bedtime. 180 tablet 1  . [DISCONTINUED] gabapentin (NEURONTIN) 300 MG capsule Take 1 capsule (300 mg total) by mouth 3 (three) times daily. (Patient not taking: Reported on 06/12/2015) 90 capsule 0   CURRENT MEDICATIONS: Scheduled Meds: . aspirin  81 mg Oral Daily  . buPROPion  300 mg Oral Daily  . enoxaparin (LOVENOX) injection  40 mg Subcutaneous Q24H  . fenofibrate  160 mg Oral Daily  . fluticasone  2 spray Each Nare BID  . [START ON 02/14/2016] pneumococcal 23 valent vaccine  0.5 mL Intramuscular Tomorrow-1000  . traZODone  100-200 mg Oral QHS   Continuous Infusions: PRN Meds:.acetaminophen, albuterol, clonazePAM, gi cocktail, morphine injection, ondansetron (ZOFRAN) IV, tiZANidine   Results for orders placed or performed during the hospital encounter of 02/12/16 (from the past 48 hour(s))  Basic metabolic panel     Status: Abnormal   Collection Time: 02/12/16  6:25 PM  Result Value Ref Range   Sodium 138 135 - 145 mmol/L   Potassium 3.6 3.5 - 5.1 mmol/L   Chloride 105 101 - 111 mmol/L   CO2 26 22 - 32 mmol/L   Glucose, Bld 89 65 - 99 mg/dL   BUN 14 6 - 20 mg/dL   Creatinine, Ser 1.19 (H) 0.44 - 1.00 mg/dL   Calcium 9.6 8.9 - 10.3 mg/dL   GFR calc non Af Amer 50 (L) >60 mL/min   GFR calc Af Amer 58 (L) >60 mL/min    Comment: (NOTE) The eGFR has been calculated using the CKD EPI  equation. This calculation has not been validated in all clinical situations. eGFR's persistently <60 mL/min signify possible Chronic Kidney Disease.    Anion gap 7 5 - 15  CBC     Status: None   Collection Time: 02/12/16  6:25 PM  Result Value Ref Range   WBC 6.3 4.0 - 10.5 K/uL   RBC 4.12 3.87 - 5.11 MIL/uL   Hemoglobin 12.4 12.0 - 15.0 g/dL   HCT 37.4 36.0 - 46.0 %   MCV 90.8 78.0 - 100.0 fL   MCH 30.1 26.0 - 34.0 pg   MCHC 33.2 30.0 - 36.0 g/dL   RDW 12.4 11.5 - 15.5 %   Platelets 287 150 - 400 K/uL  Troponin I     Status: None   Collection Time: 02/12/16  6:25 PM  Result Value Ref Range   Troponin I <0.03 <0.03 ng/mL  D-dimer, quantitative (not at [facility])     Status: None   Collection Time: 02/12/16  6:59 PM  Result Value Ref Range   D-Dimer, Quant <0.27 0.00 - 0.50 ug/mL-FEU    Comment: (NOTE) At the manufacturer cut-off of 0.50 ug/mL FEU, this assay has been documented to exclude PE with a sensitivity and negative predictive value of 97 to 99%.  At this time, this assay has not been approved by the FDA to exclude DVT/VTE. Results should be correlated with clinical presentation.   Protime-INR     Status: None   Collection Time: 02/12/16  6:59 PM  Result Value Ref Range   Prothrombin Time 13.1 11.4 - 15.2 seconds   INR 0.99   Urine rapid drug screen (hosp performed)     Status: None   Collection Time: 02/13/16  2:44 AM  Result Value Ref Range   Opiates NONE DETECTED NONE DETECTED   Cocaine NONE DETECTED NONE DETECTED   Benzodiazepines NONE DETECTED NONE DETECTED   Amphetamines NONE DETECTED NONE DETECTED   Tetrahydrocannabinol NONE DETECTED NONE DETECTED   Barbiturates NONE DETECTED NONE DETECTED    Comment:        DRUG SCREEN FOR MEDICAL PURPOSES ONLY.  IF CONFIRMATION IS NEEDED FOR ANY PURPOSE, NOTIFY LAB WITHIN 5 DAYS.        LOWEST DETECTABLE LIMITS FOR URINE DRUG SCREEN Drug Class       Cutoff (ng/mL) Amphetamine      1000 Barbiturate       200 Benzodiazepine   242 Tricyclics       353 Opiates          300 Cocaine          300 THC              50   Troponin I-serum (0, 3, 6 hours)     Status: None   Collection Time: 02/13/16  3:30 AM  Result Value Ref Range   Troponin I <0.03 <0.03 ng/mL  TSH     Status: None   Collection Time: 02/13/16  3:30 AM  Result Value Ref Range   TSH 2.504 0.350 - 4.500 uIU/mL    Comment: Performed by a 3rd Generation assay with a functional sensitivity of <=0.01 uIU/mL.   Dg Chest 2 View  Result Date: 02/12/2016 CLINICAL DATA:  Two-day history of intermittent chest pain EXAM: CHEST  2 VIEW COMPARISON:  12/09/2015 FINDINGS: The lungs are clear wiithout focal pneumonia, edema, pneumothorax or pleural effusion. The cardiopericardial silhouette is within normal limits for size. The visualized bony structures of the thorax are intact. IMPRESSION: No active cardiopulmonary disease. Electronically Signed   By: Misty Stanley M.D.   On: 02/12/2016 19:33    ROS: General:no colds or fevers, no weight changes, afebrile Skin:no rashes or ulcers HEENT:no blurred vision, no congestion CV:see HPI PUL:see HPI GI:+ diarrhea- watery stools 2 days ago, usually with constipation no melena, no indigestion GU:no hematuria, no dysuria MS:no joint pain, no claudication Neuro:no syncope, no lightheadedness Endo:no diabetes, no thyroid disease   Blood pressure (!) 101/53, pulse 61, temperature 98.5 F (36.9 C), resp. rate 13, height _0  (1.6 m), weight 137 lb 9.6 oz (62.4 kg), SpO2 99 %.  Wt Readings from Last 3 Encounters:  02/13/16 137 lb 9.6 oz (62.4 kg)  01/16/16 137 lb 3.2 oz (62.2 kg)  01/12/16 139 lb 12.8 oz (63.4 kg)    IR:WERXVQM:GQQPYPPJ affect, NAD Skin:Warm and dry, brisk capillary refill HEENT:normocephalic, sclera clear, mucus membranes moist Neck:supple, no JVD, no bruits, no masses   Heart:S1S2 RRR without murmur, gallup, rub or click Lungs:clear without rales, rhonchi, or  wheezes KDT:OIZT, non tender, + BS, do not palpate liver spleen or masses Ext:no lower ext edema, 2+ pedal pulses, 2+ radial pulses Neuro:alert and oriented X 3, MAE, follows commands, + facial symmetry   Assessment/Plan 1. Chest pain with SOB increased with exertion.  Possible lexiscan myoview to risk stratify vs. Cath with FH and continued pressure.  Dr. Acie Fredrickson to see.   2. HLD but numbers in pt without CAD normal  3.  Anxiety due to multiple medical issues.  4.  Rapid HR and pounding, her SR with ST to 115.   Cecilie Kicks  Nurse Practitioner Certified [place] Pager [phone] or after 5pm or  weekends call [phone] 02/13/2016, 7:28 AM  Attending Note:   The patient was seen and examined.  Agree with assessment and plan as noted above.  Changes made to the above note as needed.  Patient seen and independently examined with Cecilie Kicks, NP.   We discussed all aspects of the encounter. I agree with the assessment and plan as stated above.  . 1.   Unstable angina  Pt presents with progressive CP / tightness with less and less exertion over the past several weeks. Smoker Hyperlipidemia Very strong family hx of premature CAD Has NS ST changes on ECg troponins are negative   Given the progression of the angina symptoms, I would favor a cath instead of a myoview Ive discussed the risks, benefits, options of cath and PCI She understands and agrees to proceed.   2. hyperlipidmia Intolerant to statins.   May need repatha if she has CAD  3.  Neuromuscular issues : further eval per per medical    I have spent a total of 40 minutes with patient reviewing hospital  notes , telemetry, EKGs, labs and examining patient as well as establishing an assessment and plan that was discussed with the patient. > 50% of time was spent in direct patient care.    Thayer Headings, Brooke Bonito., MD, [facility] 02/13/2016, 8:51 AM [address]  [place] Pager [phone]

## 2016-02-13 NOTE — Interval H&P Note (Signed)
History and Physical Interval Note:  02/13/2016 2:21 PM  Kayla [name]  has presented today for cardiac cath with the diagnosis of unstable angina. The various methods of treatment have been discussed with the patient and family. After consideration of risks, benefits and other options for treatment, the patient has consented to  Procedure(s): Left Heart Cath and Coronary Angiography (N/A) as a surgical intervention .  The patient's history has been reviewed, patient examined, no change in status, stable for surgery.  I have reviewed the patient's chart and labs.  Questions were answered to the patient's satisfaction.    Cath Lab Visit (complete for each Cath Lab visit)  Clinical Evaluation Leading to the Procedure:   ACS: No.  Non-ACS:    Anginal Classification: CCS III  Anti-ischemic medical therapy: No Therapy  Non-Invasive Test Results: No non-invasive testing performed  Prior CABG: No previous CABG         Verne Carrowhristopher Marian Grandt

## 2016-02-13 NOTE — Discharge Instructions (Signed)

## 2016-02-13 NOTE — H&P (View-Only) (Signed)
Reason for Consult: chest pressure   Referring Physician: Dr. Lorin Mercy    PCP:  Penni Homans, MD  Primary Cardiologist: new  Kayla [name] is an 56 y.o. female.    Chief Complaint:  Presented to Er with chest tightness for a week.  Admitted to rule out and possible nuc study.   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   She has been having chest tightness for a week.  It would come and go.  No associated symptoms of nausea- though she does have chronic nausea - + SOB that was severe yesterday with neg D Dimer.  Yesterday she became very tired and lightheaded and SOB,  She rested improvement of symptoms but not resolved.  + pounding heart beat that is rapid as well.   Remote stress test.    Complex hx from neuro perspective per Dr. Lorin Mercy "Diagnosed with ALS in the past, placed into Hospice, thought she was dying, but worked very hard to improve, got better, and eventually got discharged from Hospice.  Still has left-sided weakness, balance issues.  Has left foot drop.  Had suprapubic cath and G-tube, eventually weaned off these things.  Intermittently self-caths, but has not had to do this in some time."  Her EKG SR no acute changes, normal EKG,  Troponins have been negative X 2  D-Dimer negative, CXR without acute process.   Pt anxious about Cardiac source of tightness and SOB.   Currently she felt well when she got up this AM but on the toilet the pressure returned.   Her father's family aunts uncle and grandparents had premature CAD and were worked up at [employer].  She is anxious.  She stopped tobacco Jan first of this year had been half a ppd tobacco user.  Lot of dizziness as well.   No fevers - she did have some diarrhea 2 days ago.   Past Medical History:    Diagnosis Date  . Allergic symptoms 11/06/2015  . Anxiety   . Asthma 11/06/2015  . Chronic kidney disease    pt stated "I have Stage 3 kidney disease"  . Complication of anesthesia    Pt stated "sometimes I have trouble waking up"  . Constipation due to pain medication   . DDD (degenerative disc disease), cervical   . Depression with anxiety 07/15/2015  . Functional neurological symptom disorder with mixed symptoms 05/19/2012   2007  Started then was tripping up steps. 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 Left foot drop but has worked hard not to need AFO and is stronger now.     . G tube feedings ([place])   . Glaucoma   . History of chicken pox 07/15/2015  . Hyperlipidemia, mixed 07/25/2015  . Migraine    Takes Imitrex  . Osteoarthritis of back   . Preventative health care 09/21/2015  . Primary lateral sclerosis   . RLL pneumonia ([place]) 07/15/2015  . Shortness of breath dyspnea    with exertion  . Sleep apnea 09/12/2015   H/o Bipap and VPAP use in past    Past Surgical History:  Procedure Laterality Date  . ABDOMINAL  HYSTERECTOMY     TAH b/l SPO, Dr Earleen Newport  . ANTERIOR CERVICAL DECOMP/DISCECTOMY FUSION N/A 06/19/2015   Procedure: Removal of Codman anterior cervical plate;   Anterior Cervcial Decompression Fusion Cervical Four-Five;  Surgeon: Jovita Gamma, MD;  Location: [facility];  Service: Neurosurgery;  Laterality: N/A;  left side approach  . APPENDECTOMY    . BREAST ENHANCEMENT SURGERY    . CHOLECYSTECTOMY    . COLONOSCOPY    . ECTOPIC PREGNANCY SURGERY     X 4 and miscarriage  . ESOPHAGOGASTRODUODENOSCOPY N/A 05/19/2012   Procedure: ESOPHAGOGASTRODUODENOSCOPY (EGD);  Surgeon: Beryle Beams, MD;  Location: [facility];  Service: Endoscopy;  Laterality: N/A;  . NECK SURGERY    . PEG PLACEMENT      Family History  Problem Relation Age of Onset  . Hypertension Brother   . Alcohol abuse Brother   . Alcohol abuse Mother   . Cancer  Mother     pancreatic  . Heart disease Father   . Alcohol abuse Father   . Heart disease Paternal Uncle   . Alcohol abuse Paternal Uncle    Social History:  reports that she quit smoking about 5 weeks ago. Her smoking use included Cigarettes. She has a 15.00 pack-year smoking history. She has never used smokeless tobacco. She reports that she drinks alcohol. She reports that she does not use drugs.  Allergies:  Allergies  Allergen Reactions  . Dilaudid [Hydromorphone Hcl] Itching    Can take with Benadryl  . Hydrocodone-Homatropine Nausea And Vomiting  . Codeine Nausea And Vomiting and Rash    Patient hasn't had it Codeine She doesn't think she has an existing allergy    OUTPATIENT MEDICATIONS: No current facility-administered medications on file prior to encounter.    Current Outpatient Prescriptions on File Prior to Encounter  Medication Sig Dispense Refill  . albuterol (PROVENTIL HFA;VENTOLIN HFA) 108 (90 Base) MCG/ACT inhaler Inhale 2 puffs into the lungs every 4 (four) hours as needed for wheezing or shortness of breath. 1 Inhaler 1  . albuterol (PROVENTIL) (2.5 MG/3ML) 0.083% nebulizer solution Take 3 mLs (2.5 mg total) by nebulization every 6 (six) hours as needed for wheezing or shortness of breath. 150 mL 1  . aspirin 81 MG tablet Take 81 mg by mouth daily. Reported on 07/15/2015    . benzonatate (TESSALON) 100 MG capsule Take 1 capsule (100 mg total) by mouth 3 (three) times daily as needed for cough. 21 capsule 0  . buPROPion (WELLBUTRIN XL) 300 MG 24 hr tablet Take 1 tablet (300 mg total) by mouth daily. Taking 150 mg wellbutrin 30 tablet 3  . clonazePAM (KLONOPIN) 0.5 MG tablet Take 1 tablet (0.5 mg total) by mouth 2 (two) times daily as needed for anxiety. 60 tablet 1  . estradiol (ESTRACE) 1 MG tablet Take 1 mg by mouth daily.    . fenofibrate (TRICOR) 145 MG tablet Take 1 tablet (145 mg total) by mouth daily. 30 tablet 3  . fluticasone (FLONASE) 50 MCG/ACT nasal spray  Place 2 sprays into both nostrils 2 (two) times daily.    . montelukast (SINGULAIR) 10 MG tablet Take 1 tablet (10 mg total) by mouth at bedtime as needed. 30 tablet 3  . Multiple Vitamin (MULTI VITAMIN DAILY PO) Take 1 tablet by mouth daily.     . rizatriptan (MAXALT) 10 MG tablet Take 1 tablet (10 mg total) by mouth as needed for migraine. May repeat in 2 hours if needed 10 tablet 2  .  tiZANidine (ZANAFLEX) 4 MG tablet Take 1 tablet (4 mg total) by mouth every 6 (six) hours as needed for muscle spasms. 90 tablet 1  . traZODone (DESYREL) 100 MG tablet Take 1-2 tablets (100-200 mg total) by mouth at bedtime. 180 tablet 1  . [DISCONTINUED] gabapentin (NEURONTIN) 300 MG capsule Take 1 capsule (300 mg total) by mouth 3 (three) times daily. (Patient not taking: Reported on 06/12/2015) 90 capsule 0   CURRENT MEDICATIONS: Scheduled Meds: . aspirin  81 mg Oral Daily  . buPROPion  300 mg Oral Daily  . enoxaparin (LOVENOX) injection  40 mg Subcutaneous Q24H  . fenofibrate  160 mg Oral Daily  . fluticasone  2 spray Each Nare BID  . [START ON 02/14/2016] pneumococcal 23 valent vaccine  0.5 mL Intramuscular Tomorrow-1000  . traZODone  100-200 mg Oral QHS   Continuous Infusions: PRN Meds:.acetaminophen, albuterol, clonazePAM, gi cocktail, morphine injection, ondansetron (ZOFRAN) IV, tiZANidine   Results for orders placed or performed during the hospital encounter of 02/12/16 (from the past 48 hour(s))  Basic metabolic panel     Status: Abnormal   Collection Time: 02/12/16  6:25 PM  Result Value Ref Range   Sodium 138 135 - 145 mmol/L   Potassium 3.6 3.5 - 5.1 mmol/L   Chloride 105 101 - 111 mmol/L   CO2 26 22 - 32 mmol/L   Glucose, Bld 89 65 - 99 mg/dL   BUN 14 6 - 20 mg/dL   Creatinine, Ser 1.19 (H) 0.44 - 1.00 mg/dL   Calcium 9.6 8.9 - 10.3 mg/dL   GFR calc non Af Amer 50 (L) >60 mL/min   GFR calc Af Amer 58 (L) >60 mL/min    Comment: (NOTE) The eGFR has been calculated using the CKD EPI  equation. This calculation has not been validated in all clinical situations. eGFR's persistently <60 mL/min signify possible Chronic Kidney Disease.    Anion gap 7 5 - 15  CBC     Status: None   Collection Time: 02/12/16  6:25 PM  Result Value Ref Range   WBC 6.3 4.0 - 10.5 K/uL   RBC 4.12 3.87 - 5.11 MIL/uL   Hemoglobin 12.4 12.0 - 15.0 g/dL   HCT 37.4 36.0 - 46.0 %   MCV 90.8 78.0 - 100.0 fL   MCH 30.1 26.0 - 34.0 pg   MCHC 33.2 30.0 - 36.0 g/dL   RDW 12.4 11.5 - 15.5 %   Platelets 287 150 - 400 K/uL  Troponin I     Status: None   Collection Time: 02/12/16  6:25 PM  Result Value Ref Range   Troponin I <0.03 <0.03 ng/mL  D-dimer, quantitative (not at [facility])     Status: None   Collection Time: 02/12/16  6:59 PM  Result Value Ref Range   D-Dimer, Quant <0.27 0.00 - 0.50 ug/mL-FEU    Comment: (NOTE) At the manufacturer cut-off of 0.50 ug/mL FEU, this assay has been documented to exclude PE with a sensitivity and negative predictive value of 97 to 99%.  At this time, this assay has not been approved by the FDA to exclude DVT/VTE. Results should be correlated with clinical presentation.   Protime-INR     Status: None   Collection Time: 02/12/16  6:59 PM  Result Value Ref Range   Prothrombin Time 13.1 11.4 - 15.2 seconds   INR 0.99   Urine rapid drug screen (hosp performed)     Status: None   Collection Time: 02/13/16  2:44 AM  Result Value Ref Range   Opiates NONE DETECTED NONE DETECTED   Cocaine NONE DETECTED NONE DETECTED   Benzodiazepines NONE DETECTED NONE DETECTED   Amphetamines NONE DETECTED NONE DETECTED   Tetrahydrocannabinol NONE DETECTED NONE DETECTED   Barbiturates NONE DETECTED NONE DETECTED    Comment:        DRUG SCREEN FOR MEDICAL PURPOSES ONLY.  IF CONFIRMATION IS NEEDED FOR ANY PURPOSE, NOTIFY LAB WITHIN 5 DAYS.        LOWEST DETECTABLE LIMITS FOR URINE DRUG SCREEN Drug Class       Cutoff (ng/mL) Amphetamine      1000 Barbiturate       200 Benzodiazepine   242 Tricyclics       353 Opiates          300 Cocaine          300 THC              50   Troponin I-serum (0, 3, 6 hours)     Status: None   Collection Time: 02/13/16  3:30 AM  Result Value Ref Range   Troponin I <0.03 <0.03 ng/mL  TSH     Status: None   Collection Time: 02/13/16  3:30 AM  Result Value Ref Range   TSH 2.504 0.350 - 4.500 uIU/mL    Comment: Performed by a 3rd Generation assay with a functional sensitivity of <=0.01 uIU/mL.   Dg Chest 2 View  Result Date: 02/12/2016 CLINICAL DATA:  Two-day history of intermittent chest pain EXAM: CHEST  2 VIEW COMPARISON:  12/09/2015 FINDINGS: The lungs are clear wiithout focal pneumonia, edema, pneumothorax or pleural effusion. The cardiopericardial silhouette is within normal limits for size. The visualized bony structures of the thorax are intact. IMPRESSION: No active cardiopulmonary disease. Electronically Signed   By: Misty Stanley M.D.   On: 02/12/2016 19:33    ROS: General:no colds or fevers, no weight changes, afebrile Skin:no rashes or ulcers HEENT:no blurred vision, no congestion CV:see HPI PUL:see HPI GI:+ diarrhea- watery stools 2 days ago, usually with constipation no melena, no indigestion GU:no hematuria, no dysuria MS:no joint pain, no claudication Neuro:no syncope, no lightheadedness Endo:no diabetes, no thyroid disease   Blood pressure (!) 101/53, pulse 61, temperature 98.5 F (36.9 C), resp. rate 13, height _0  (1.6 m), weight 137 lb 9.6 oz (62.4 kg), SpO2 99 %.  Wt Readings from Last 3 Encounters:  02/13/16 137 lb 9.6 oz (62.4 kg)  01/16/16 137 lb 3.2 oz (62.2 kg)  01/12/16 139 lb 12.8 oz (63.4 kg)    IR:WERXVQM:GQQPYPPJ affect, NAD Skin:Warm and dry, brisk capillary refill HEENT:normocephalic, sclera clear, mucus membranes moist Neck:supple, no JVD, no bruits, no masses   Heart:S1S2 RRR without murmur, gallup, rub or click Lungs:clear without rales, rhonchi, or  wheezes KDT:OIZT, non tender, + BS, do not palpate liver spleen or masses Ext:no lower ext edema, 2+ pedal pulses, 2+ radial pulses Neuro:alert and oriented X 3, MAE, follows commands, + facial symmetry   Assessment/Plan 1. Chest pain with SOB increased with exertion.  Possible lexiscan myoview to risk stratify vs. Cath with FH and continued pressure.  Dr. Acie Fredrickson to see.   2. HLD but numbers in pt without CAD normal  3.  Anxiety due to multiple medical issues.  4.  Rapid HR and pounding, her SR with ST to 115.   Cecilie Kicks  Nurse Practitioner Certified [place] Pager [phone] or after 5pm or  weekends call [phone] 02/13/2016, 7:28 AM  Attending Note:   The patient was seen and examined.  Agree with assessment and plan as noted above.  Changes made to the above note as needed.  Patient seen and independently examined with Cecilie Kicks, NP.   We discussed all aspects of the encounter. I agree with the assessment and plan as stated above.  . 1.   Unstable angina  Pt presents with progressive CP / tightness with less and less exertion over the past several weeks. Smoker Hyperlipidemia Very strong family hx of premature CAD Has NS ST changes on ECg troponins are negative   Given the progression of the angina symptoms, I would favor a cath instead of a myoview Ive discussed the risks, benefits, options of cath and PCI She understands and agrees to proceed.   2. hyperlipidmia Intolerant to statins.   May need repatha if she has CAD  3.  Neuromuscular issues : further eval per per medical    I have spent a total of 40 minutes with patient reviewing hospital  notes , telemetry, EKGs, labs and examining patient as well as establishing an assessment and plan that was discussed with the patient. > 50% of time was spent in direct patient care.    Thayer Headings, Brooke Bonito., MD, [facility] 02/13/2016, 8:51 AM [address]  [place] Pager [phone]

## 2016-02-13 NOTE — Care Management Obs Status (Signed)
MEDICARE OBSERVATION STATUS NOTIFICATION   Patient Details  Name: Kayla [name] MRN: [id number] Date of Birth: 1960/06/17   Medicare Observation Status Notification Given:  Yes    Gala LewandowskyGraves-Bigelow, Kayla Class Kaye, RN 02/13/2016, 10:16 AM

## 2016-02-13 NOTE — Progress Notes (Signed)
Patient seen and examined, admitted earlier this a.m. by Dr.Niu 55/F with history of neuro- muscular disorder, , tobacco abuse, dyslipidemia and strong family history of premature CAD was admitted with unstable angina, progressive chest pain - nonspecific ST-T wave changes on EKG, troponins are negative, cardiology consulted, Plan for left heart cath Today  Kayla CovePreetha Lendora Keys, MD

## 2016-02-16 ENCOUNTER — Encounter (HOSPITAL_COMMUNITY): Payer: Self-pay | Admitting: Cardiovascular Disease

## 2016-02-16 ENCOUNTER — Telehealth: Payer: Self-pay | Admitting: Behavioral Health

## 2016-02-16 NOTE — Telephone Encounter (Signed)
Patient declined TCM/Hospital Follow-up at this time. She voiced that if anything changes she will call the office to schedule an appointment.

## 2016-02-25 ENCOUNTER — Encounter (HOSPITAL_BASED_OUTPATIENT_CLINIC_OR_DEPARTMENT_OTHER): Payer: Self-pay

## 2016-02-29 ENCOUNTER — Ambulatory Visit (HOSPITAL_BASED_OUTPATIENT_CLINIC_OR_DEPARTMENT_OTHER): Payer: Medicare Other | Attending: Pulmonary Disease | Admitting: Pulmonary Disease

## 2016-02-29 DIAGNOSIS — G4710 Hypersomnia, unspecified: Secondary | ICD-10-CM | POA: Insufficient documentation

## 2016-03-03 ENCOUNTER — Telehealth: Payer: Self-pay | Admitting: Pulmonary Disease

## 2016-03-03 DIAGNOSIS — G4710 Hypersomnia, unspecified: Secondary | ICD-10-CM | POA: Diagnosis not present

## 2016-03-03 NOTE — Telephone Encounter (Signed)
Spoke with pt and made her aware of her results per AD. Pt had no further questions. Nothing further is needed at this time.

## 2016-03-03 NOTE — Telephone Encounter (Signed)
    Please call the pt and tell the pt the LAB SLEEP STUDY did not show significant OSA.   I can discuss further my recommendations on her f/u with me in March.     Thanks!   J. Alexis FrockAngelo A de Dios, MD 03/03/2016, 3:41 AM

## 2016-03-03 NOTE — Procedures (Signed)
    NAME: Kayla CalixLisa [name] DATE OF BIRTH:  1960/02/26 MEDICAL RECORD NUMBER [id number]  LOCATION: [facility]  PHYSICIAN: Daneen SchickJose Angelo A De Dios  DATE OF STUDY: 02/29/2016     CLINICAL INFORMATION  Sleep Study Type: NPSG  Indication for sleep study: Excessive Daytime Sleepiness, Snoring, Witnessed Apneas  Epworth Sleepiness Score: 7   SLEEP STUDY TECHNIQUE  As per the AASM Manual for the Scoring of Sleep and Associated Events v2.3 (April 2016) with a hypopnea requiring 4% desaturations.  The channels recorded and monitored were frontal, central and occipital EEG, electrooculogram (EOG), submentalis EMG (chin), nasal and oral airflow, thoracic and abdominal wall motion, anterior tibialis EMG, snore microphone, electrocardiogram, and pulse oximetry.   MEDICATIONS  Medications self-administered by patient taken the night of the study : TRAZODONE. Meds reviewed per chart review done.  SLEEP ARCHITECTURE  The study was initiated at 10:48:01 PM and ended at 4:58:10 AM.  Sleep onset time was 27.9 minutes and the sleep efficiency was 61.3%. The total sleep time was 226.8 minutes.  Stage REM latency was 132.0 minutes.  The patient spent 8.93% of the night in stage N1 sleep, 68.58% in stage N2 sleep, 11.03% in stage N3 and 11.47% in REM.  Alpha intrusion was absent.  Supine sleep was 38.18%.   RESPIRATORY PARAMETERS  The overall apnea/hypopnea index (AHI) was 0.5 per hour. There were 1 total apneas, including 0 obstructive, 1 central and 0 mixed apneas. There were 1 hypopneas and 2 RERAs. The AHI during Stage REM sleep was 0.0 per hour. AHI while supine was 1.4 per hour. The mean oxygen saturation was 93.96%. The minimum SpO2 during sleep was 90.00%. Loud snoring was noted during this study.  CARDIAC DATA  The 2 lead EKG demonstrated sinus rhythm. The mean heart rate was 72.65 beats per minute. Other EKG findings include: None.   LEG MOVEMENT DATA  The total PLMS were 0  with a resulting PLMS index of 0.00. Associated arousal with leg movement index was 0.0 .  IMPRESSIONS  1. No significant obstructive sleep apnea occurred during this study (AHI = 0.5/h). 2. No significant central sleep apnea occurred during this study (CAI = 0.3/h). 3. The patient had minimal or no oxygen desaturation during the study (Min O2 = 90.00%) 4. The patient snored with Loud snoring volume. 5. No cardiac abnormalities were noted during this study. 6. Clinically significant periodic limb movements did not occur during sleep. No significant associated arousals. 7.  DIAGNOSIS  No evidence for OSA based on this study.   RECOMMENDATIONS  1. No evidence for OSA based on this study. Suggest work up of hypersomnia. This could be related to poor sleep hygiene as well as related to medications. 2. Avoid alcohol, sedatives and other CNS depressants that may worsen sleep apnea and disrupt normal sleep architecture. 3. Sleep hygiene should be reviewed to assess factors that may improve sleep quality. 4. Weight management and regular exercise should be initiated or continued if appropriate. 5. Follow up in the office as scheduled.  Pollie MeyerJ. Angelo A de Dios, MD 03/03/2016, 3:39 AM [facility] Pager [phone] After 3 pm or if no answer, call [phone]

## 2016-03-10 ENCOUNTER — Other Ambulatory Visit: Payer: Self-pay | Admitting: Family Medicine

## 2016-03-15 ENCOUNTER — Ambulatory Visit: Payer: Self-pay | Admitting: Pulmonary Disease

## 2016-04-19 ENCOUNTER — Ambulatory Visit: Payer: Self-pay | Admitting: Family Medicine

## 2016-05-03 ENCOUNTER — Ambulatory Visit (INDEPENDENT_AMBULATORY_CARE_PROVIDER_SITE_OTHER): Payer: Medicare Other | Admitting: Medical

## 2016-05-03 VITALS — BP 122/52 | HR 79 | Temp 97.6°F | Resp 16 | Ht 63.0 in | Wt 138.9 lb

## 2016-05-03 DIAGNOSIS — J40 Bronchitis, not specified as acute or chronic: Secondary | ICD-10-CM | POA: Diagnosis not present

## 2016-05-03 DIAGNOSIS — J0100 Acute maxillary sinusitis, unspecified: Secondary | ICD-10-CM

## 2016-05-03 DIAGNOSIS — R05 Cough: Secondary | ICD-10-CM

## 2016-05-03 DIAGNOSIS — J301 Allergic rhinitis due to pollen: Secondary | ICD-10-CM

## 2016-05-03 DIAGNOSIS — R062 Wheezing: Secondary | ICD-10-CM

## 2016-05-03 DIAGNOSIS — R059 Cough, unspecified: Secondary | ICD-10-CM

## 2016-05-03 MED ORDER — DOXYCYCLINE HYCLATE 100 MG PO TABS: 100.0000 mg | ORAL_TABLET | Freq: Two times a day (BID) | ORAL | 0 refills | Status: DC

## 2016-05-03 MED ORDER — METHYLPREDNISOLONE ACETATE 40 MG/ML IJ SUSP
40.0000 mg | Freq: Once | INTRAMUSCULAR | Status: AC
  Administered 2016-05-03: 40 mg via INTRAMUSCULAR

## 2016-05-03 MED ORDER — BENZONATATE 100 MG PO CAPS: 100.0000 mg | ORAL_CAPSULE | Freq: Three times a day (TID) | ORAL | 0 refills | Status: DC | PRN

## 2016-05-03 NOTE — Progress Notes (Signed)
Subjective:    Patient ID: Kayla [name], female    DOB: [birth year], 56 y.o.   MRN: [id number]  HPI   Pt in states about about 2 weeks she had exposure to pollens. She was at beach when symptoms started.   Pt describes got nasal congestion,pnd, sneezing and sinus pressure.  She most recently has been coughing up some mucous and some has some sinus pressure.  Pt is wheezing some at night. Using albuterol about  2-3 times since onset of her illness over last 2 days.    Review of Systems  Constitutional: Negative for chills, fatigue and fever.  HENT: Positive for congestion, rhinorrhea, sinus pain and sinus pressure.   Respiratory: Positive for cough and wheezing. Negative for shortness of breath.        See hpi.  Cardiovascular: Negative for chest pain.  Gastrointestinal: Negative for abdominal pain.  Musculoskeletal: Negative for arthralgias, back pain and myalgias.       Neck soreness. Mild decrease rom. But pt had 2 surgery in her neck in past.   Neurological: Negative for dizziness, seizures, syncope, speech difficulty, weakness, light-headedness and numbness.       Sinus pressure and posterior occipital ha.  Hematological: Negative for adenopathy. Does not bruise/bleed easily.  Psychiatric/Behavioral: Negative for behavioral problems, confusion, dysphoric mood and suicidal ideas.   Past Medical History:  Diagnosis Date  . Allergic symptoms 11/06/2015  . Anxiety   . Asthma 11/06/2015  . Chronic kidney disease    pt stated "I have Stage 3 kidney disease"  . Complication of anesthesia    Pt stated "sometimes I have trouble waking up"  . Constipation due to pain medication   . DDD (degenerative disc disease), cervical   . Depression with anxiety 07/15/2015  . Functional neurological symptom disorder with mixed symptoms 05/19/2012   2007  Started then was tripping up steps. 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 Left foot drop but has  worked hard not to need AFO and is stronger now.     . G tube feedings (HCC)   . Glaucoma   . History of chicken pox 07/15/2015  . Hyperlipidemia, mixed 07/25/2015  . Migraine    Takes Imitrex  . Osteoarthritis of back   . Preventative health care 09/21/2015  . Primary lateral sclerosis   . RLL pneumonia (HCC) 07/15/2015  . Shortness of breath dyspnea    with exertion  . Sleep apnea 09/12/2015   H/o Bipap and VPAP use in past     Social History   Social History  . Marital status: Married    Spouse name: N/A  . Number of children: N/A  . Years of education: college   Occupational History  . 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 Smoking status: Former Smoker    Packs/day: 0.50    Years: 30.00    Types: Cigarettes    Quit date: 01/05/2016  . Smokeless tobacco: Never Used  . Alcohol use Yes     Comment: occasional  . Drug use: No  . Sexual activity: Yes    Birth control/ protection: Surgical   Other Topics Concern  . Not on file   Social History Narrative   Lives with boyfriend, widowed, then divorced, remarried 2017. No major dietary restrictions. 1/2 ppd, drinks a small amount socially    Past Surgical History:  Procedure Laterality Date  .  ABDOMINAL HYSTERECTOMY     TAH b/l SPO, Dr [address]  . ANTERIOR CERVICAL DECOMP/DISCECTOMY FUSION N/A 06/19/2015   Procedure: Removal of Codman anterior cervical plate;   Anterior Cervcial Decompression Fusion Cervical Four-Five;  Surgeon: Shirlean Kelly, MD;  Location: [facility];  Service: Neurosurgery;  Laterality: N/A;  left side approach  . APPENDECTOMY    . BREAST ENHANCEMENT SURGERY    . CHOLECYSTECTOMY    . COLONOSCOPY    . ECTOPIC PREGNANCY SURGERY     X 4 and miscarriage  . ESOPHAGOGASTRODUODENOSCOPY N/A 05/19/2012   Procedure: ESOPHAGOGASTRODUODENOSCOPY (EGD);  Surgeon: Theda Belfast, MD;  Location: [facility] ENDOSCOPY;  Service: Endoscopy;  Laterality: N/A;  . LEFT HEART CATH AND CORONARY  ANGIOGRAPHY N/A 02/13/2016   Procedure: Left Heart Cath and Coronary Angiography;  Surgeon: Kathleene Hazel, MD;  Location: [facility] INVASIVE CV LAB;  Service: Cardiovascular;  Laterality: N/A;  . NECK SURGERY    . PEG PLACEMENT      Family History  Problem Relation Age of Onset  . Hypertension Brother   . Alcohol abuse Brother   . Alcohol abuse Mother   . Cancer Mother     pancreatic  . Heart disease Father   . Alcohol abuse Father   . Heart disease Paternal Uncle   . Alcohol abuse Paternal Uncle     Allergies  Allergen Reactions  . Dilaudid [Hydromorphone Hcl] Itching and Other (See Comments)    Can take with Benadryl  . Hydrocodone-Homatropine Nausea And Vomiting  . Codeine Nausea And Vomiting, Rash and Other (See Comments)    Patient hasn't had it Codeine She doesn't think she has an existing allergy    Current Outpatient Prescriptions on File Prior to Visit  Medication Sig Dispense Refill  . albuterol (PROVENTIL HFA;VENTOLIN HFA) 108 (90 Base) MCG/ACT inhaler Inhale 2 puffs into the lungs every 4 (four) hours as needed for wheezing or shortness of breath. 1 Inhaler 1  . albuterol (PROVENTIL) (2.5 MG/3ML) 0.083% nebulizer solution Take 3 mLs (2.5 mg total) by nebulization every 6 (six) hours as needed for wheezing or shortness of breath. 150 mL 1  . aspirin 81 MG tablet Take 81 mg by mouth daily. Reported on 07/15/2015    . benzonatate (TESSALON) 100 MG capsule Take 1 capsule (100 mg total) by mouth 3 (three) times daily as needed for cough. 21 capsule 0  . buPROPion (WELLBUTRIN XL) 300 MG 24 hr tablet Take 1 tablet (300 mg total) by mouth daily. Taking 150 mg wellbutrin (Patient taking differently: Take 150 mg by mouth daily. ) 30 tablet 3  . clonazePAM (KLONOPIN) 0.5 MG tablet Take 1 tablet (0.5 mg total) by mouth 2 (two) times daily as needed for anxiety. 60 tablet 1  . estradiol (ESTRACE) 1 MG tablet Take 1 mg by mouth daily.    . fenofibrate (TRICOR) 145 MG tablet TAKE 1  TABLET(145 MG) BY MOUTH DAILY 30 tablet 0  . fluticasone (FLONASE) 50 MCG/ACT nasal spray Place 2 sprays into both nostrils 2 (two) times daily.    Marland Kitchen ibuprofen (ADVIL,MOTRIN) 200 MG tablet Take 600 mg by mouth every 6 (six) hours as needed for headache or moderate pain.    . Multiple Vitamin (MULTI VITAMIN DAILY PO) Take 1 tablet by mouth daily.     . nicotine polacrilex (NICOTINE MINI) 4 MG lozenge Take 4 mg by mouth as needed for smoking cessation.    . rizatriptan (MAXALT) 10 MG tablet Take 1 tablet (10 mg  total) by mouth as needed for migraine. May repeat in 2 hours if needed (Patient taking differently: Take 5-10 mg by mouth as needed for migraine. May repeat in 2 hours if needed) 10 tablet 2  . tiZANidine (ZANAFLEX) 4 MG tablet Take 1 tablet (4 mg total) by mouth every 6 (six) hours as needed for muscle spasms. 90 tablet 1  . traZODone (DESYREL) 100 MG tablet Take 1-2 tablets (100-200 mg total) by mouth at bedtime. (Patient taking differently: Take 200 mg by mouth at bedtime. ) 180 tablet 1  . [DISCONTINUED] gabapentin (NEURONTIN) 300 MG capsule Take 1 capsule (300 mg total) by mouth 3 (three) times daily. (Patient not taking: Reported on 06/12/2015) 90 capsule 0   No current facility-administered medications on file prior to visit.     BP (!) 122/52 (BP Location: Right Arm, Patient Position: Sitting, Cuff Size: Normal)   Pulse 79   Temp 97.6 F (36.4 C) (Oral)   Resp 16   Ht  (1.6 m)   Wt 138 lb 14.4 oz (63 kg)   SpO2 99%   BMI 24.61 kg/m       Objective:   Physical Exam   General  Mental Status - Alert. General Appearance - Well groomed. Not in acute distress.  Skin Rashes- No Rashes.  HEENT Head- Normal. Ear Auditory Canal - Left- Normal. Right - Normal.Tympanic Membrane- Left- Normal. Right- Normal. Eye Sclera/Conjunctiva- Left- Normal. Right- Normal. Nose & Sinuses Nasal Mucosa- Left-  Boggy and Congested. Right-  Boggy and  Congested.Bilateral maxillary and  frontal sinus pressure. Mouth & Throat Lips: Upper Lip- Normal: no dryness, cracking, pallor, cyanosis, or vesicular eruption. Lower Lip-Normal: no dryness, cracking, pallor, cyanosis or vesicular eruption. Buccal Mucosa- Bilateral- No Aphthous ulcers. Oropharynx- No Discharge or Erythema. Tonsils: Characteristics- Bilateral- No Erythema or Congestion. Size/Enlargement- Bilateral- No enlargement. Discharge- bilateral-None.  Neck Neck- Supple. No Masses. I don't see any obvious severe disoloration. Only faint pinkish appearance to skin.  Decent Rom of neck. Some restricted likely from prior surgery    Chest and Lung Exam Auscultation: Breath Sounds:-Clear even and unlabored.  Cardiovascular Auscultation:Rythm- Regular, rate and rhythm. Murmurs & Other Heart Sounds:Ausculatation of the heart reveal- No Murmurs.  Lymphatic Head & Neck General Head & Neck Lymphatics: Bilateral: Description- No Localized lymphadenopathy.   Neurologic Cranial Nerve exam:- CN III-XII intact(No nystagmus), symmetric smile. Strength:- 5/5 equal and symmetric strength both upper and lower extremities.     Assessment & Plan:  For allergies continue flonase spray, astelin nasal spray, and adding depomedrol 40 mg im.  For bronchitis and possible sinusitis rx doxycycline  For cough rx benzonatate.  If wheezing worsens let us [place] know.  Regarding your neck soreness I want to know within a day or two by march if you feel better.  Follow up in 7 days or as needed   Shreyas Piatkowski, Ramon Dredge, [employer]

## 2016-05-03 NOTE — Progress Notes (Signed)
Pre visit review using our clinic review tool, if applicable. No additional management support is needed unless otherwise documented below in the visit note. 

## 2016-05-03 NOTE — Patient Instructions (Addendum)
For allergies continue flonase spray, astelin nasal spray, and adding depomedrol 40 mg im.  For bronchitis and possible sinusitis rx doxycycline  For cough rx benzonatate.  If wheezing worsens let us [place] know.  Regarding your neck soreness I want to know within a day or two by march if you feel better.   Follow up in 7 days or as needed

## 2016-05-11 ENCOUNTER — Encounter: Payer: Self-pay | Admitting: Family Medicine

## 2016-05-11 ENCOUNTER — Ambulatory Visit (INDEPENDENT_AMBULATORY_CARE_PROVIDER_SITE_OTHER): Payer: Medicare Other | Admitting: Family Medicine

## 2016-05-11 VITALS — BP 106/9 | HR 71 | Temp 98.5°F | Wt 139.0 lb

## 2016-05-11 DIAGNOSIS — E559 Vitamin D deficiency, unspecified: Secondary | ICD-10-CM

## 2016-05-11 DIAGNOSIS — R739 Hyperglycemia, unspecified: Secondary | ICD-10-CM

## 2016-05-11 DIAGNOSIS — M542 Cervicalgia: Secondary | ICD-10-CM

## 2016-05-11 DIAGNOSIS — E538 Deficiency of other specified B group vitamins: Secondary | ICD-10-CM | POA: Diagnosis not present

## 2016-05-11 DIAGNOSIS — T7840XS Allergy, unspecified, sequela: Secondary | ICD-10-CM

## 2016-05-11 DIAGNOSIS — J309 Allergic rhinitis, unspecified: Secondary | ICD-10-CM

## 2016-05-11 DIAGNOSIS — E782 Mixed hyperlipidemia: Secondary | ICD-10-CM

## 2016-05-11 DIAGNOSIS — J329 Chronic sinusitis, unspecified: Secondary | ICD-10-CM

## 2016-05-11 HISTORY — DX: Deficiency of other specified B group vitamins: E53.8

## 2016-05-11 HISTORY — DX: Vitamin D deficiency, unspecified: E55.9

## 2016-05-11 MED ORDER — CLONAZEPAM 0.5 MG PO TABS: 0.5000 mg | ORAL_TABLET | Freq: Two times a day (BID) | ORAL | 1 refills | Status: DC | PRN

## 2016-05-11 MED ORDER — MONTELUKAST SODIUM 10 MG PO TABS: 10.0000 mg | ORAL_TABLET | Freq: Every day | ORAL | 0 refills | Status: DC

## 2016-05-11 NOTE — Patient Instructions (Addendum)
Mucinex, probiotics, nasal saline, cetirizine twice daily  Allergic Rhinitis Allergic rhinitis is when the mucous membranes in the nose respond to allergens. Allergens are particles in the air that cause your body to have an allergic reaction. This causes you to release allergic antibodies. Through a chain of events, these eventually cause you to release histamine into the blood stream. Although meant to protect the body, it is this release of histamine that causes your discomfort, such as frequent sneezing, congestion, and an itchy, runny nose. What are the causes? Seasonal allergic rhinitis (hay fever) is caused by pollen allergens that may come from grasses, trees, and weeds. Year-round allergic rhinitis (perennial allergic rhinitis) is caused by allergens such as house dust mites, pet dander, and mold spores. What are the signs or symptoms?  Nasal stuffiness (congestion).  Itchy, runny nose with sneezing and tearing of the eyes. How is this diagnosed? Your health care provider can help you determine the allergen or allergens that trigger your symptoms. If you and your health care provider are unable to determine the allergen, skin or blood testing may be used. Your health care provider will diagnose your condition after taking your health history and performing a physical exam. Your health care provider may assess you for other related conditions, such as asthma, pink eye, or an ear infection. How is this treated? Allergic rhinitis does not have a cure, but it can be controlled by:  Medicines that block allergy symptoms. These may include allergy shots, nasal sprays, and oral antihistamines.  Avoiding the allergen. Hay fever may often be treated with antihistamines in pill or nasal spray forms. Antihistamines block the effects of histamine. There are over-the-counter medicines that may help with nasal congestion and swelling around the eyes. Check with your health care provider before taking  or giving this medicine. If avoiding the allergen or the medicine prescribed do not work, there are many new medicines your health care provider can prescribe. Stronger medicine may be used if initial measures are ineffective. Desensitizing injections can be used if medicine and avoidance does not work. Desensitization is when a patient is given ongoing shots until the body becomes less sensitive to the allergen. Make sure you follow up with your health care provider if problems continue. Follow these instructions at home: It is not possible to completely avoid allergens, but you can reduce your symptoms by taking steps to limit your exposure to them. It helps to know exactly what you are allergic to so that you can avoid your specific triggers. Contact a health care provider if:  You have a fever.  You develop a cough that does not stop easily (persistent).  You have shortness of breath.  You start wheezing.  Symptoms interfere with normal daily activities. This information is not intended to replace advice given to you by your health care provider. Make sure you discuss any questions you have with your health care provider. Document Released: 09/15/2000 Document Revised: 08/22/2015 Document Reviewed: 08/28/2012 Elsevier Interactive Patient Education  2017 [employer]

## 2016-05-11 NOTE — Progress Notes (Signed)
Patient ID: Warren Kugelman, female   DOB: April 25, 1960, 56 y.o.   MRN: [id number]   Subjective:  I acted as a Neurosurgeon for Danise Edge, MD. [place], [place]   Patient ID: Monie Shere, female    DOB: 31-Jan-1960, 56 y.o.   MRN: [id number]  Chief Complaint  Patient presents with  . Hyperlipidemia    13-month F/U.    Hyperlipidemia  This is a chronic problem. The problem is controlled. Pertinent negatives include no chest pain or shortness of breath.    Patient is in today for a 68-month follow up. Patient states that she is still having an ongoing going issue with fatigue. Also states that she has been experiencing a pin-prick like sensation that is in just her right hand and in left arm it goes from her left shoulder down to her finger tips. Patient has a Hx of syncope and collapse, sleep apnea, asthma, HNP, hypersomnia, exertional dyspnea. Patient has no additional acute concerns noted at this time. She denies any recent injury or fall. Denies CP/palp/SOB/HA/congestion/fevers/GI or GU c/o. Taking meds as prescribed  Patient Care Team: Bradd Canary, MD as PCP - General (Family Medicine)   Past Medical History:  Diagnosis Date  . Allergic symptoms 11/06/2015  . Anxiety   . Asthma 11/06/2015  . Chronic kidney disease    pt stated "I have Stage 3 kidney disease"  . Complication of anesthesia    Pt stated "sometimes I have trouble waking up"  . Constipation due to pain medication   . DDD (degenerative disc disease), cervical   . Depression with anxiety 07/15/2015  . Functional neurological symptom disorder with mixed symptoms 05/19/2012   2007  Started then was tripping up steps. 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 Left foot drop but has worked hard not to need AFO and is stronger now.     . G tube feedings (HCC)   . Glaucoma   . History of chicken pox 07/15/2015  . Hyperlipidemia, mixed 07/25/2015  . Migraine    Takes Imitrex  . Osteoarthritis of back     . Preventative health care 09/21/2015  . Primary lateral sclerosis (HCC)   . RLL pneumonia (HCC) 07/15/2015  . Shortness of breath dyspnea    with exertion  . Sinusitis 05/12/2016  . Sleep apnea 09/12/2015   H/o Bipap and VPAP use in past  . Vitamin B 12 deficiency 05/11/2016  . Vitamin D deficiency 05/11/2016    Past Surgical History:  Procedure Laterality Date  . ABDOMINAL HYSTERECTOMY     TAH b/l SPO, Dr [address]  . ANTERIOR CERVICAL DECOMP/DISCECTOMY FUSION N/A 06/19/2015   Procedure: Removal of Codman anterior cervical plate;   Anterior Cervcial Decompression Fusion Cervical Four-Five;  Surgeon: Shirlean Kelly, MD;  Location: [facility];  Service: Neurosurgery;  Laterality: N/A;  left side approach  . APPENDECTOMY    . BREAST ENHANCEMENT SURGERY    . CHOLECYSTECTOMY    . COLONOSCOPY    . ECTOPIC PREGNANCY SURGERY     X 4 and miscarriage  . ESOPHAGOGASTRODUODENOSCOPY N/A 05/19/2012   Procedure: ESOPHAGOGASTRODUODENOSCOPY (EGD);  Surgeon: Theda Belfast, MD;  Location: [facility] ENDOSCOPY;  Service: Endoscopy;  Laterality: N/A;  . LEFT HEART CATH AND CORONARY ANGIOGRAPHY N/A 02/13/2016   Procedure: Left Heart Cath and Coronary Angiography;  Surgeon: Kathleene Hazel, MD;  Location: [facility] INVASIVE CV LAB;  Service: Cardiovascular;  Laterality: N/A;  . NECK SURGERY    .  PEG PLACEMENT      Family History  Problem Relation Age of Onset  . Hypertension Brother   . Alcohol abuse Brother   . Alcohol abuse Mother   . Cancer Mother     pancreatic  . Heart disease Father   . Alcohol abuse Father   . Heart disease Paternal Uncle   . Alcohol abuse Paternal Uncle     Social History   Social History  . Marital status: Married    Spouse name: N/A  . Number of children: N/A  . Years of education: college   Occupational History  . 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 Smoking status: Former Smoker    Packs/day: 0.50    Years: 30.00    Types: Cigarettes     Quit date: 01/05/2016  . Smokeless tobacco: Never Used  . Alcohol use Yes     Comment: occasional  . Drug use: No  . Sexual activity: Yes    Birth control/ protection: Surgical   Other Topics Concern  . Not on file   Social History Narrative   Lives with boyfriend, widowed, then divorced, remarried 2017. No major dietary restrictions. 1/2 ppd, drinks a small amount socially    Outpatient Medications Prior to Visit  Medication Sig Dispense Refill  . albuterol (PROVENTIL HFA;VENTOLIN HFA) 108 (90 Base) MCG/ACT inhaler Inhale 2 puffs into the lungs every 4 (four) hours as needed for wheezing or shortness of breath. 1 Inhaler 1  . albuterol (PROVENTIL) (2.5 MG/3ML) 0.083% nebulizer solution Take 3 mLs (2.5 mg total) by nebulization every 6 (six) hours as needed for wheezing or shortness of breath. 150 mL 1  . aspirin 81 MG tablet Take 81 mg by mouth daily. Reported on 07/15/2015    . buPROPion (WELLBUTRIN XL) 300 MG 24 hr tablet Take 1 tablet (300 mg total) by mouth daily. Taking 150 mg wellbutrin (Patient taking differently: Take 150 mg by mouth daily. ) 30 tablet 3  . estradiol (ESTRACE) 1 MG tablet Take 1 mg by mouth daily.    . fenofibrate (TRICOR) 145 MG tablet TAKE 1 TABLET(145 MG) BY MOUTH DAILY 30 tablet 0  . fluticasone (FLONASE) 50 MCG/ACT nasal spray Place 2 sprays into both nostrils 2 (two) times daily.    Marland Kitchen ibuprofen (ADVIL,MOTRIN) 200 MG tablet Take 600 mg by mouth every 6 (six) hours as needed for headache or moderate pain.    . Multiple Vitamin (MULTI VITAMIN DAILY PO) Take 1 tablet by mouth daily.     . nicotine polacrilex (NICOTINE MINI) 4 MG lozenge Take 4 mg by mouth as needed for smoking cessation.    . rizatriptan (MAXALT) 10 MG tablet Take 1 tablet (10 mg total) by mouth as needed for migraine. May repeat in 2 hours if needed (Patient taking differently: Take 5-10 mg by mouth as needed for migraine. May repeat in 2 hours if needed) 10 tablet 2  . tiZANidine (ZANAFLEX) 4 MG  tablet Take 1 tablet (4 mg total) by mouth every 6 (six) hours as needed for muscle spasms. 90 tablet 1  . traZODone (DESYREL) 100 MG tablet Take 1-2 tablets (100-200 mg total) by mouth at bedtime. (Patient taking differently: Take 200 mg by mouth at bedtime. ) 180 tablet 1  . benzonatate (TESSALON) 100 MG capsule Take 1 capsule (100 mg total) by mouth 3 (three) times daily as needed for cough. 21 capsule 0  . benzonatate (TESSALON) 100 MG  capsule Take 1 capsule (100 mg total) by mouth 3 (three) times daily as needed for cough. 21 capsule 0  . clonazePAM (KLONOPIN) 0.5 MG tablet Take 1 tablet (0.5 mg total) by mouth 2 (two) times daily as needed for anxiety. 60 tablet 1  . doxycycline (VIBRA-TABS) 100 MG tablet Take 1 tablet (100 mg total) by mouth 2 (two) times daily. Can give caps or generic 20 tablet 0   No facility-administered medications prior to visit.     Allergies  Allergen Reactions  . Dilaudid [Hydromorphone Hcl] Itching and Other (See Comments)    Can take with Benadryl  . Hydrocodone-Homatropine Nausea And Vomiting  . Codeine Nausea And Vomiting, Rash and Other (See Comments)    Patient hasn't had it Codeine She doesn't think she has an existing allergy    Review of Systems  Constitutional: Positive for malaise/fatigue. Negative for fever.  HENT: Positive for congestion.   Eyes: Negative for blurred vision.  Respiratory: Positive for sputum production. Negative for cough and shortness of breath.   Cardiovascular: Negative for chest pain, palpitations and leg swelling.  Gastrointestinal: Negative for vomiting.  Musculoskeletal: Positive for neck pain. Negative for back pain.  Skin: Negative for rash.  Neurological: Positive for sensory change. Negative for loss of consciousness and headaches.       Objective:    Physical Exam  Constitutional: She is oriented to person, place, and time. She appears well-developed and well-nourished. No distress.  HENT:  Head:  Normocephalic and atraumatic.  Eyes: Conjunctivae are normal.  Neck: Normal range of motion. No thyromegaly present.  Cardiovascular: Normal rate and regular rhythm.   Pulmonary/Chest: Effort normal and breath sounds normal. She has no wheezes.  Abdominal: Soft. Bowel sounds are normal. There is no tenderness.  Musculoskeletal: She exhibits no edema or deformity.  Neurological: She is alert and oriented to person, place, and time.  Skin: Skin is warm and dry. She is not diaphoretic.  Psychiatric: She has a normal mood and affect.    BP (!) 106/9 (BP Location: Left Arm, Patient Position: Sitting, Cuff Size: Normal)   Pulse 71   Temp 98.5 F (36.9 C) (Oral)   Wt 139 lb (63 kg)   SpO2 98% Comment: RA  BMI 24.62 kg/m  Wt Readings from Last 3 Encounters:  05/11/16 139 lb (63 kg)  05/03/16 138 lb 14.4 oz (63 kg)  03/01/16 132 lb (59.9 kg)   BP Readings from Last 3 Encounters:  05/11/16 (!) 106/9  05/03/16 (!) 122/52  02/13/16 (!) 97/56     Immunization History  Administered Date(s) Administered  . Influenza,inj,Quad PF,36+ Mos 09/12/2015    Health Maintenance  Topic Date Due  . Hepatitis C Screening  August 30, 1960  . HIV Screening  03/19/1975  . PAP SMEAR  03/18/1981  . INFLUENZA VACCINE  08/04/2016  . MAMMOGRAM  06/04/2017  . COLONOSCOPY  05/05/2023  . TETANUS/TDAP  08/07/2023    Lab Results  Component Value Date   WBC 6.3 02/12/2016   HGB 12.4 02/12/2016   HCT 37.4 02/12/2016   PLT 287 02/12/2016   GLUCOSE 89 02/12/2016   CHOL 181 11/06/2015   TRIG 76.0 11/06/2015   HDL 74.30 11/06/2015   LDLCALC 92 11/06/2015   ALT 17 11/06/2015   AST 17 11/06/2015   NA 138 02/12/2016   K 3.6 02/12/2016   CL 105 02/12/2016   CREATININE 1.19 (H) 02/12/2016   BUN 14 02/12/2016   CO2 26 02/12/2016   TSH 2.504  02/13/2016   INR 1.03 02/13/2016   HGBA1C 5.6 11/06/2015    Lab Results  Component Value Date   TSH 2.504 02/13/2016   Lab Results  Component Value Date   WBC  6.3 02/12/2016   HGB 12.4 02/12/2016   HCT 37.4 02/12/2016   MCV 90.8 02/12/2016   PLT 287 02/12/2016   Lab Results  Component Value Date   NA 138 02/12/2016   K 3.6 02/12/2016   CO2 26 02/12/2016   GLUCOSE 89 02/12/2016   BUN 14 02/12/2016   CREATININE 1.19 (H) 02/12/2016   BILITOT 0.5 11/06/2015   ALKPHOS 36 (L) 11/06/2015   AST 17 11/06/2015   ALT 17 11/06/2015   PROT 7.3 11/06/2015   ALBUMIN 4.8 11/06/2015   CALCIUM 9.6 02/12/2016   ANIONGAP 7 02/12/2016   GFR 52.47 (L) 11/06/2015   Lab Results  Component Value Date   CHOL 181 11/06/2015   Lab Results  Component Value Date   HDL 74.30 11/06/2015   Lab Results  Component Value Date   LDLCALC 92 11/06/2015   Lab Results  Component Value Date   TRIG 76.0 11/06/2015   Lab Results  Component Value Date   CHOLHDL 2 11/06/2015   Lab Results  Component Value Date   HGBA1C 5.6 11/06/2015         Assessment & Plan:   Problem List Items Addressed This Visit    Hyperglycemia - Primary    hgba1c acceptable, minimize simple carbs. Increase exercise as tolerated.       Relevant Orders   Comprehensive metabolic panel   TSH   Hemoglobin A1c   Hyperlipidemia, mixed    Tolerating meds. Encouraged heart healthy diet, increase exercise, avoid trans fats, consider a krill oil cap daily      Relevant Orders   Lipid panel   TSH   Allergic symptoms    Add daily singulair and continue Flonase and daily antihistamines      Relevant Orders   Ambulatory referral to ENT   Vitamin B 12 deficiency   Relevant Orders   CBC   Vitamin B12   Vitamin D deficiency    Encouraged daily supplements      Relevant Orders   Vitamin D (25 hydroxy)   Sinusitis    Recurrent with deviated septum, likely allergies, worsening symptoms. Referred to ENT for further consideration       Other Visit Diagnoses    Allergic rhinitis, unspecified seasonality, unspecified trigger       Relevant Orders   CBC      I have  discontinued Ms. Mcgath's benzonatate, doxycycline, and benzonatate. I am also having her start on montelukast. Additionally, I am having her maintain her estradiol, Multiple Vitamin (MULTI VITAMIN DAILY PO), aspirin, fluticasone, albuterol, albuterol, buPROPion, tiZANidine, traZODone, rizatriptan, ibuprofen, nicotine polacrilex, fenofibrate, and clonazePAM.  Meds ordered this encounter  Medications  . clonazePAM (KLONOPIN) 0.5 MG tablet    Sig: Take 1 tablet (0.5 mg total) by mouth 2 (two) times daily as needed for anxiety.    Dispense:  60 tablet    Refill:  1  . montelukast (SINGULAIR) 10 MG tablet    Sig: Take 1 tablet (10 mg total) by mouth at bedtime.    Dispense:  30 tablet    Refill:  0    CMA served as scribe during this visit. History, Physical and Plan performed by medical provider. Documentation and orders reviewed and attested to.  Danise Edge, MD

## 2016-05-11 NOTE — Progress Notes (Signed)
Pre visit review using our clinic review tool, if applicable. No additional management support is needed unless otherwise documented below in the visit note. 

## 2016-05-12 ENCOUNTER — Encounter: Payer: Self-pay | Admitting: Family Medicine

## 2016-05-12 ENCOUNTER — Other Ambulatory Visit (INDEPENDENT_AMBULATORY_CARE_PROVIDER_SITE_OTHER): Payer: Medicare Other

## 2016-05-12 DIAGNOSIS — R739 Hyperglycemia, unspecified: Secondary | ICD-10-CM

## 2016-05-12 DIAGNOSIS — J309 Allergic rhinitis, unspecified: Secondary | ICD-10-CM | POA: Diagnosis not present

## 2016-05-12 DIAGNOSIS — E538 Deficiency of other specified B group vitamins: Secondary | ICD-10-CM | POA: Diagnosis not present

## 2016-05-12 DIAGNOSIS — E782 Mixed hyperlipidemia: Secondary | ICD-10-CM

## 2016-05-12 DIAGNOSIS — E559 Vitamin D deficiency, unspecified: Secondary | ICD-10-CM

## 2016-05-12 DIAGNOSIS — J329 Chronic sinusitis, unspecified: Secondary | ICD-10-CM

## 2016-05-12 DIAGNOSIS — M542 Cervicalgia: Secondary | ICD-10-CM

## 2016-05-12 HISTORY — DX: Chronic sinusitis, unspecified: J32.9

## 2016-05-12 HISTORY — DX: Cervicalgia: M54.2

## 2016-05-12 LAB — CBC
HEMATOCRIT: 40.9 % (ref 36.0–46.0)
HEMOGLOBIN: 13.6 g/dL (ref 12.0–15.0)
MCHC: 33.3 g/dL (ref 30.0–36.0)
MCV: 90.1 fl (ref 78.0–100.0)
PLATELETS: 274 10*3/uL (ref 150.0–400.0)
RBC: 4.54 Mil/uL (ref 3.87–5.11)
RDW: 13.1 % (ref 11.5–15.5)
WBC: 5.3 10*3/uL (ref 4.0–10.5)

## 2016-05-12 LAB — COMPREHENSIVE METABOLIC PANEL
ALBUMIN: 4.5 g/dL (ref 3.5–5.2)
ALT: 12 U/L (ref 0–35)
AST: 15 U/L (ref 0–37)
Alkaline Phosphatase: 39 U/L (ref 39–117)
BUN: 14 mg/dL (ref 6–23)
CALCIUM: 9.6 mg/dL (ref 8.4–10.5)
CHLORIDE: 104 meq/L (ref 96–112)
CO2: 27 meq/L (ref 19–32)
Creatinine, Ser: 1.06 mg/dL (ref 0.40–1.20)
GFR: 56.96 mL/min — AB (ref >60.00)
Glucose, Bld: 128 mg/dL — ABNORMAL HIGH (ref 70–99)
POTASSIUM: 3.6 meq/L (ref 3.5–5.1)
Sodium: 135 mEq/L (ref 135–145)
Total Bilirubin: 0.3 mg/dL (ref 0.2–1.2)
Total Protein: 6.9 g/dL (ref 6.0–8.3)

## 2016-05-12 LAB — LIPID PANEL
CHOL/HDL RATIO: 2
CHOLESTEROL: 199 mg/dL (ref 0–200)
HDL: 80.8 mg/dL (ref >39.00)
LDL CALC: 103 mg/dL — AB (ref 0–99)
NonHDL: 118.17
TRIGLYCERIDES: 77 mg/dL (ref 0.0–149.0)
VLDL: 15.4 mg/dL (ref 0.0–40.0)

## 2016-05-12 LAB — VITAMIN B12: Vitamin B-12: 267 pg/mL (ref 211–911)

## 2016-05-12 LAB — VITAMIN D 25 HYDROXY (VIT D DEFICIENCY, FRACTURES): VITD: 54.57 ng/mL (ref 30.00–100.00)

## 2016-05-12 LAB — TSH: TSH: 0.55 u[IU]/mL (ref 0.35–4.50)

## 2016-05-12 LAB — HEMOGLOBIN A1C: Hgb A1c MFr Bld: 5.6 % (ref 4.6–6.5)

## 2016-05-12 NOTE — Assessment & Plan Note (Signed)
Add daily singulair and continue Flonase and daily antihistamines

## 2016-05-12 NOTE — Assessment & Plan Note (Signed)
hgba1c acceptable, minimize simple carbs. Increase exercise as tolerated.  

## 2016-05-12 NOTE — Assessment & Plan Note (Signed)
Tolerating meds. Encouraged heart healthy diet, increase exercise, avoid trans fats, consider a krill oil cap daily

## 2016-05-12 NOTE — Assessment & Plan Note (Signed)
Encouraged daily supplements 

## 2016-05-12 NOTE — Assessment & Plan Note (Signed)
Recurrent with deviated septum, likely allergies, worsening symptoms. Referred to ENT for further consideration

## 2016-06-07 ENCOUNTER — Other Ambulatory Visit: Payer: Self-pay | Admitting: Family Medicine

## 2016-06-09 ENCOUNTER — Other Ambulatory Visit: Payer: Self-pay | Admitting: Neurosurgery

## 2016-06-09 DIAGNOSIS — S129XXA Fracture of neck, unspecified, initial encounter: Secondary | ICD-10-CM

## 2016-06-09 DIAGNOSIS — M4722 Other spondylosis with radiculopathy, cervical region: Secondary | ICD-10-CM

## 2016-06-15 ENCOUNTER — Other Ambulatory Visit (HOSPITAL_COMMUNITY): Payer: Self-pay | Admitting: Neurosurgery

## 2016-06-15 DIAGNOSIS — S129XXA Fracture of neck, unspecified, initial encounter: Secondary | ICD-10-CM

## 2016-06-15 DIAGNOSIS — M4722 Other spondylosis with radiculopathy, cervical region: Secondary | ICD-10-CM

## 2016-06-17 ENCOUNTER — Ambulatory Visit (HOSPITAL_COMMUNITY)
Admission: RE | Admit: 2016-06-17 | Discharge: 2016-06-17 | Disposition: A | Discharge status: Home / Self Care | Payer: Medicare Other | Source: Ambulatory Visit | Attending: Neurosurgery | Admitting: Neurosurgery

## 2016-06-17 DIAGNOSIS — S129XXA Fracture of neck, unspecified, initial encounter: Secondary | ICD-10-CM

## 2016-06-17 DIAGNOSIS — M4802 Spinal stenosis, cervical region: Secondary | ICD-10-CM | POA: Diagnosis not present

## 2016-06-17 DIAGNOSIS — M4722 Other spondylosis with radiculopathy, cervical region: Secondary | ICD-10-CM

## 2016-07-06 IMAGING — CT CT HEAD W/O CM
1 series · 16 of 30 positions shown, 20 images · non-contrast
Comparison: No comparison head CT available.  Brain MRI 12/20/2012

CLINICAL DATA: Code stroke for left-sided numbness and 7 p.m..
Dizziness and headache.

EXAM:
CT HEAD WITHOUT CONTRAST
TECHNIQUE: Contiguous axial images were obtained from the base of the skull
through the vertex without intravenous contrast.

[Series 2: head 4.8 h37s · axial · 0.45mm/px · z∈[-169,-32]mm · 16 of 32 slices shown, 20 images]
[im 2/32  brain]
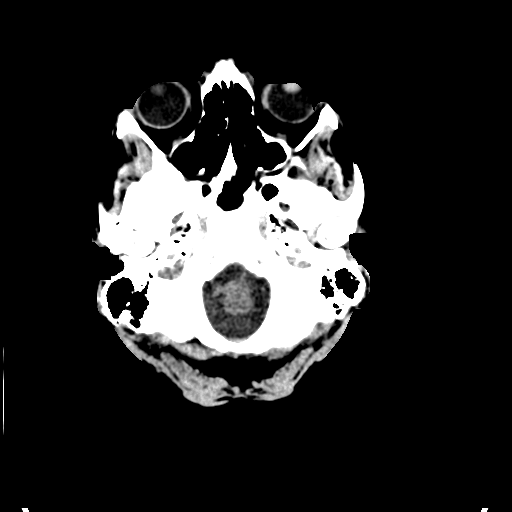
[im 2/32  bone]
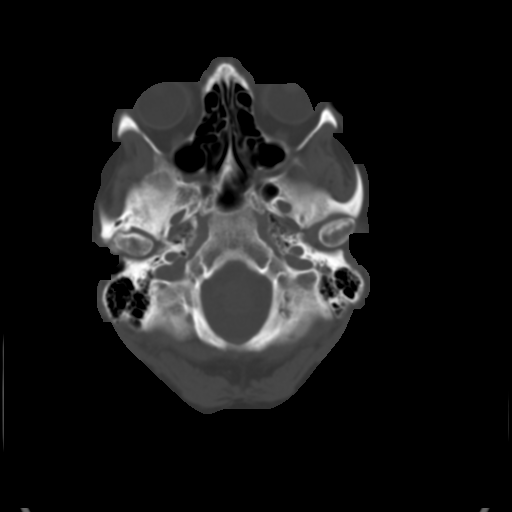
[im 4/32  brain]
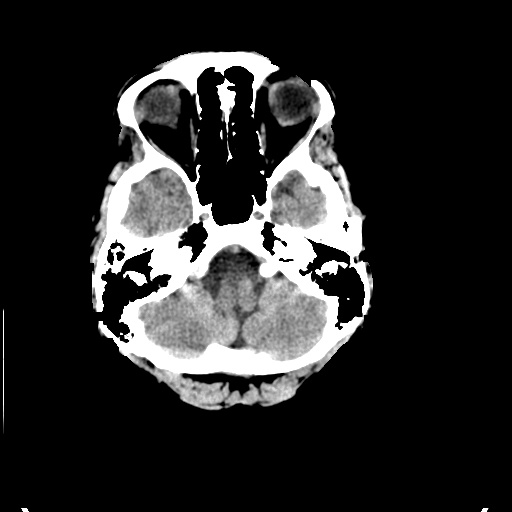
[im 6/32  brain]
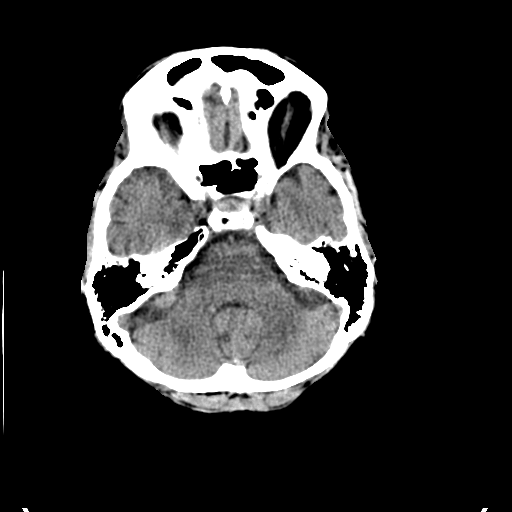
[im 8/32  brain]
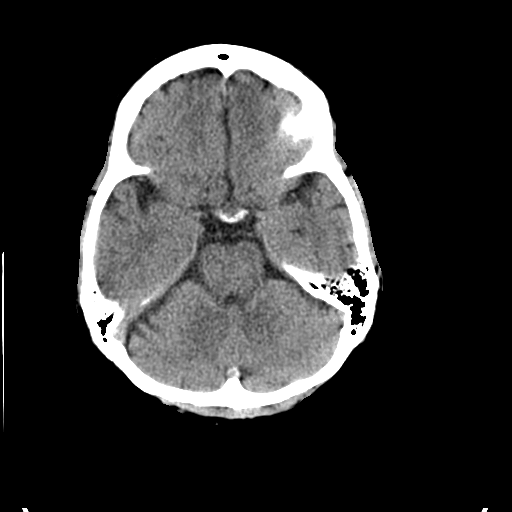
[im 9/32  brain]
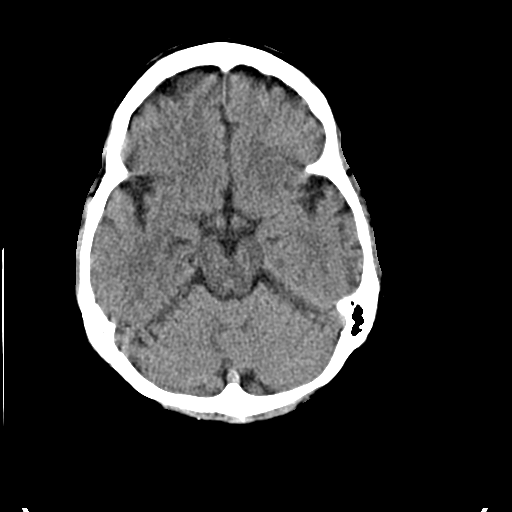
[im 9/32  bone]
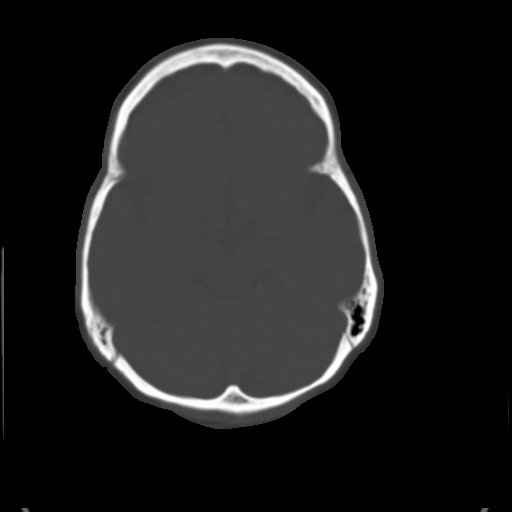
[im 11/32  brain]
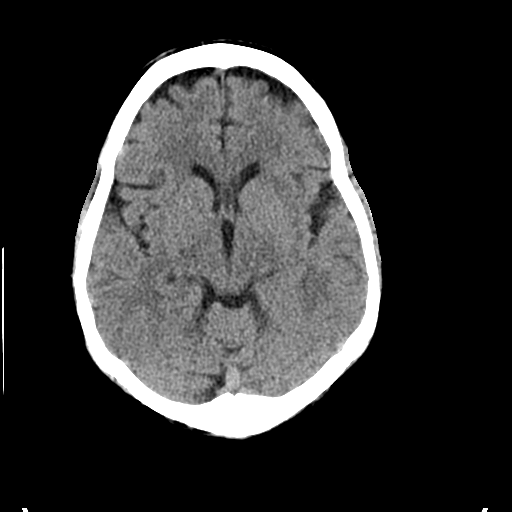
[im 13/32  brain]
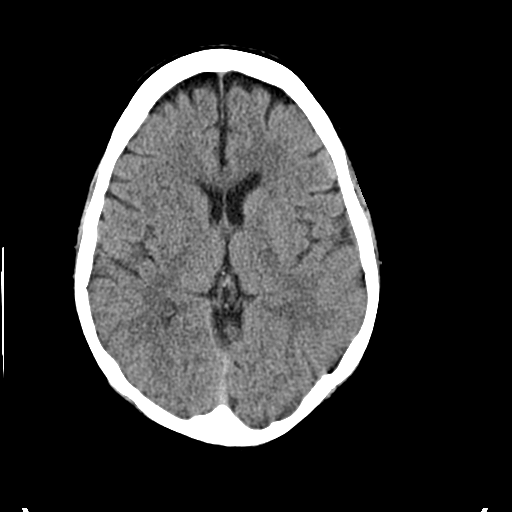
[im 15/32  brain]
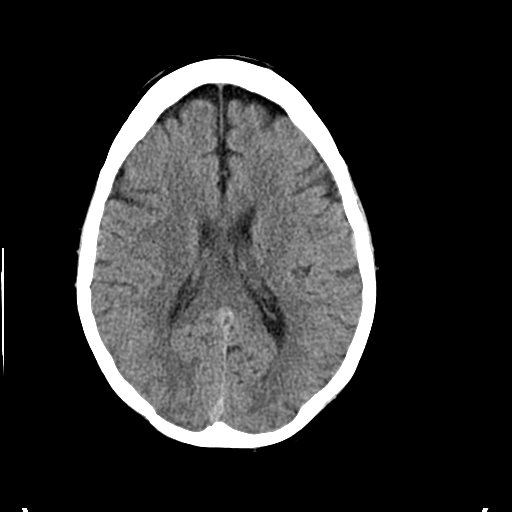
[im 17/32  brain]
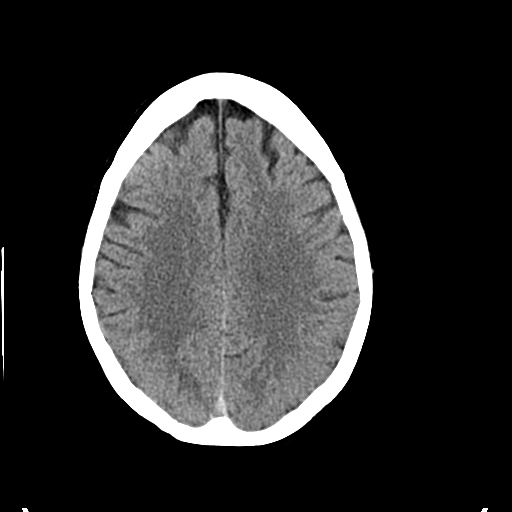
[im 17/32  bone]
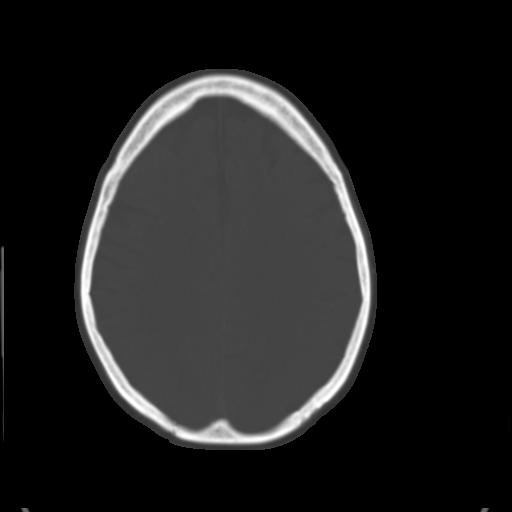
[im 19/32  brain]
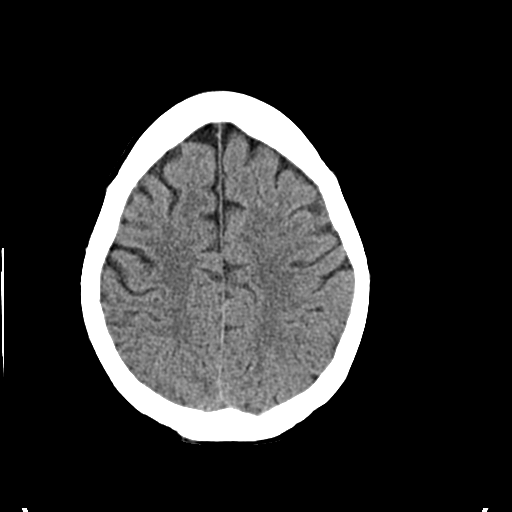
[im 21/32  brain]
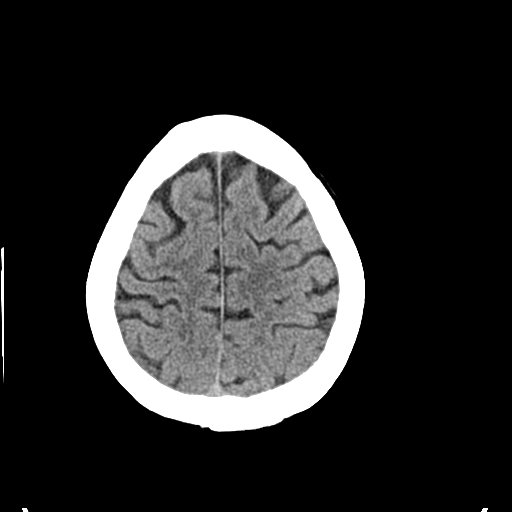
[im 23/32  brain]
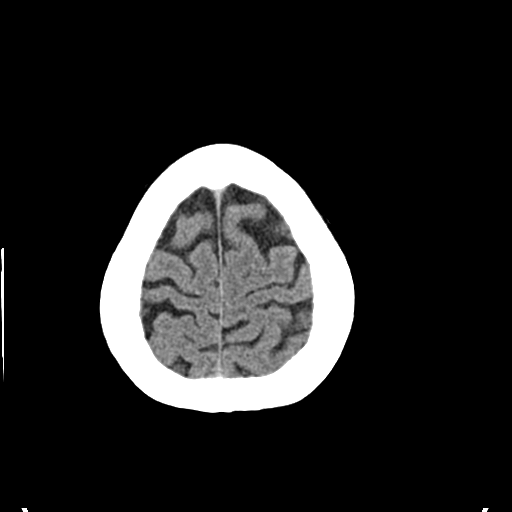
[im 24/32  brain]
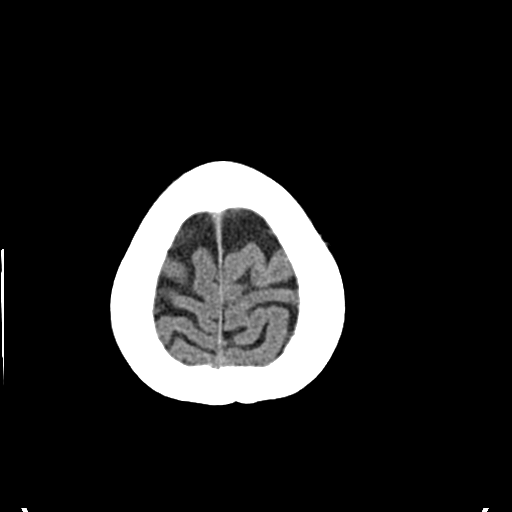
[im 24/32  bone]
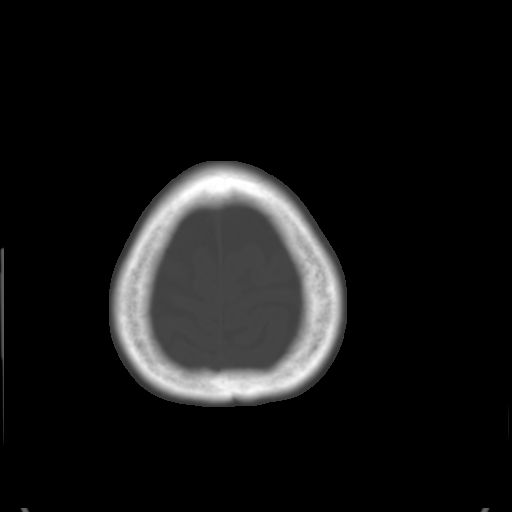
[im 26/32  brain]
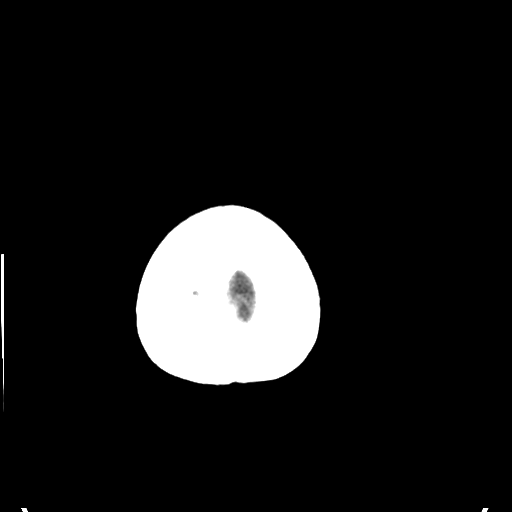
[im 28/32  brain]
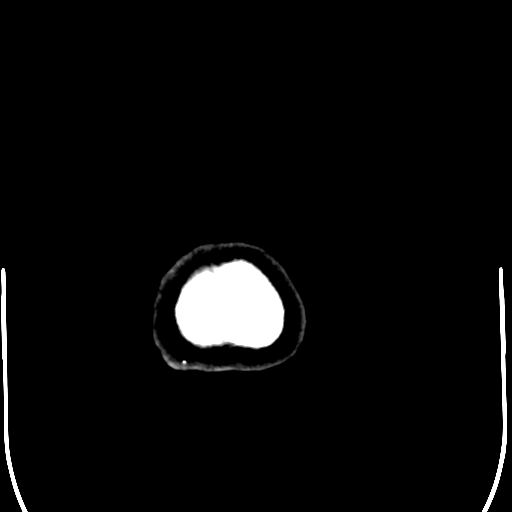
[im 30/32  brain]
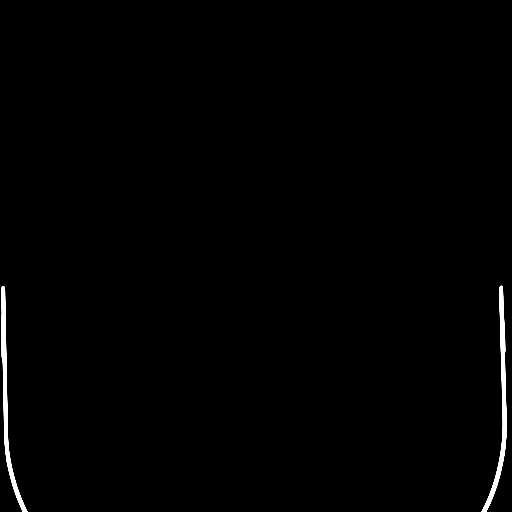

[16 of 30 positions shown; findings below may reference images not displayed]

FINDINGS: Skull and Sinuses:Negative for fracture or destructive process. The
mastoids, middle ears, and imaged paranasal sinuses are clear.

Orbits: No acute abnormality.

Brain: No evidence of acute infarction, hemorrhage, hydrocephalus,
or mass lesion/mass effect.

These results were called by telephone at the time of interpretation
on 05/27/2014 at [DATE] to Dr. SAMIULLHA AKHIL , who verbally
acknowledged these results.
IMPRESSION: Negative head CT.

## 2016-07-06 IMAGING — CT CT CERVICAL SPINE W/O CM
3 series · 13 of 33 positions shown, 16 images · non-contrast
Comparison: Head CT- earlier same day

CLINICAL DATA: Left-sided numbness and 7 p.m.. Headache, dizziness,
stable episode.

EXAM:
CT CERVICAL SPINE WITHOUT CONTRAST
TECHNIQUE: Multidetector CT imaging of the cervical spine was performed without
intravenous contrast. Multiplanar CT image reconstructions were also
generated.

[Series 3: c_spine 2.0 b41s st · axial · 0.22mm/px · z∈[-389,-263]mm · 5 of 91 slices shown, 7 images]
[im 14/91  soft-tissue]
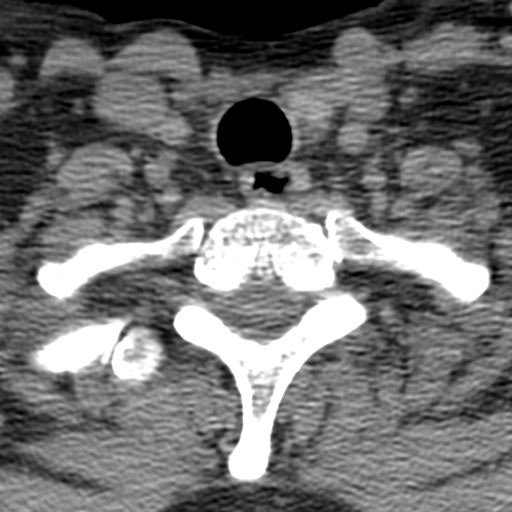
[im 14/91  bone]
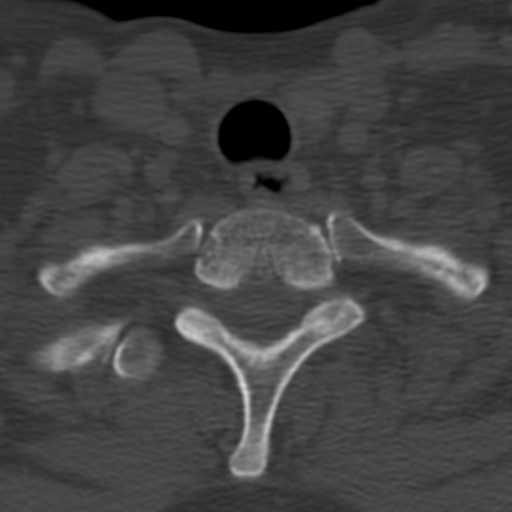
[im 28/91  bone]
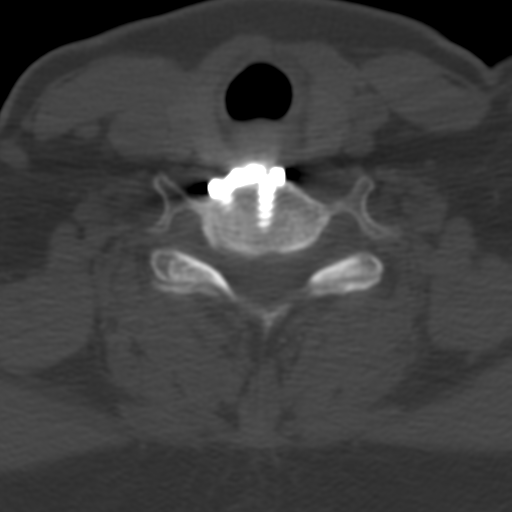
[im 49/91  bone]
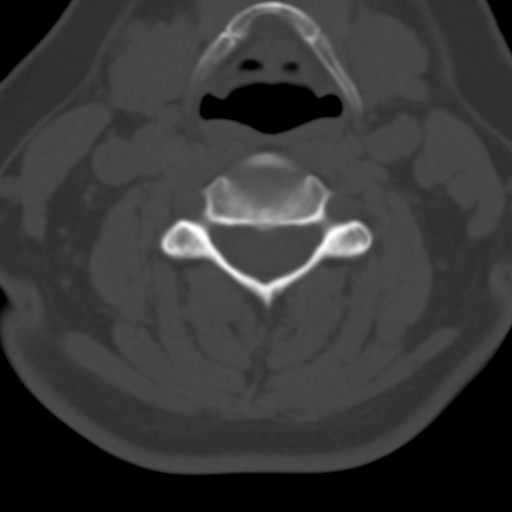
[im 63/91  bone]
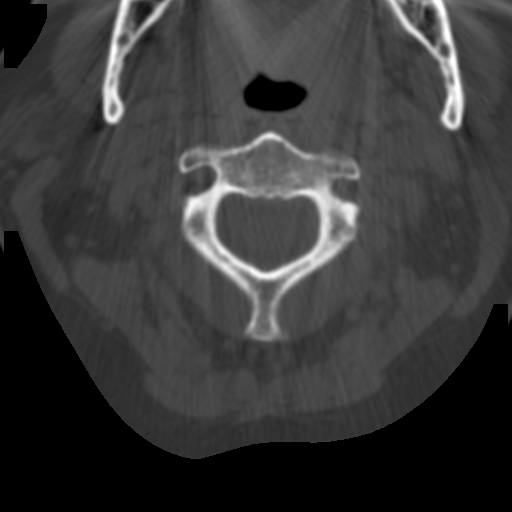
[im 77/91  soft-tissue]
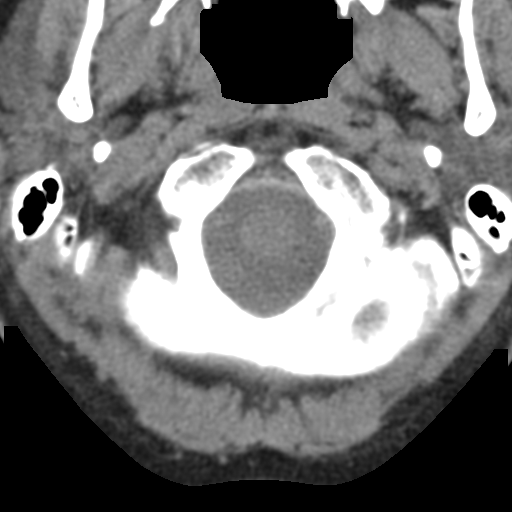
[im 77/91  bone]
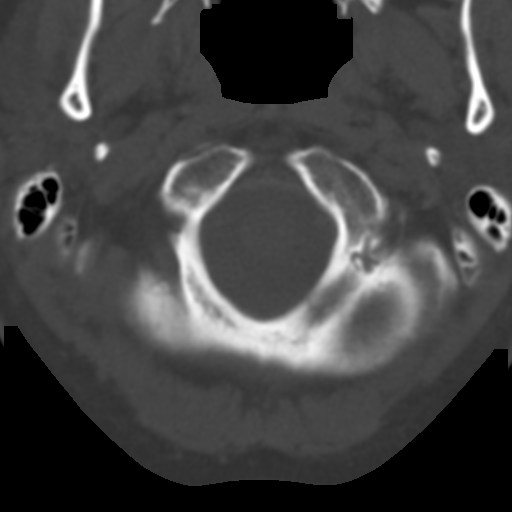

[Series 6: c_spine 2.0 coronal · coronal · 0.23mm/px · 3 of 44 slices shown]
[im 9/44  bone]
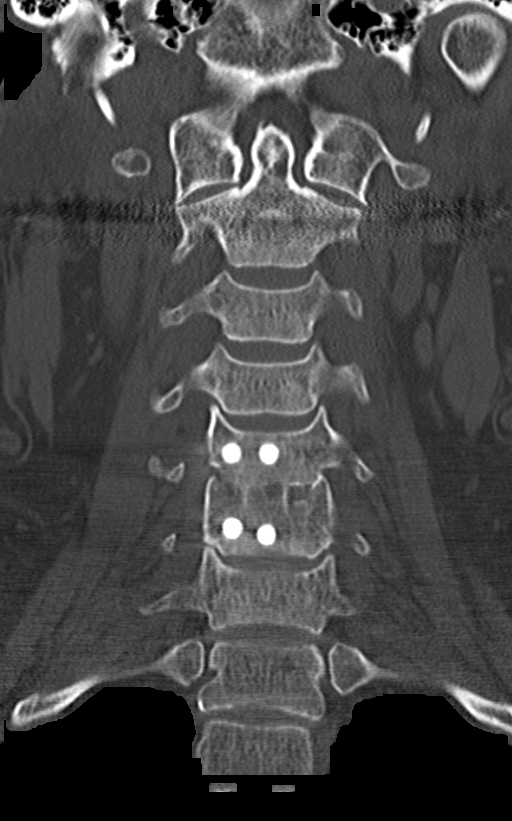
[im 18/44  bone]
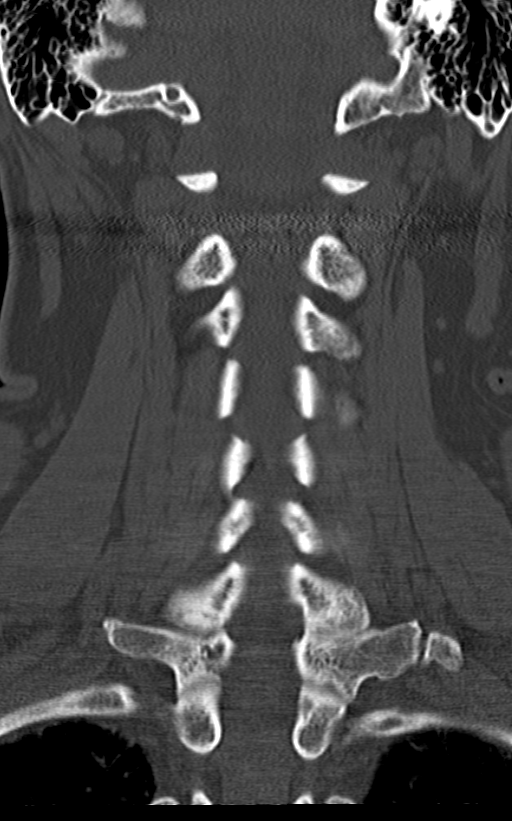
[im 26/44  bone]
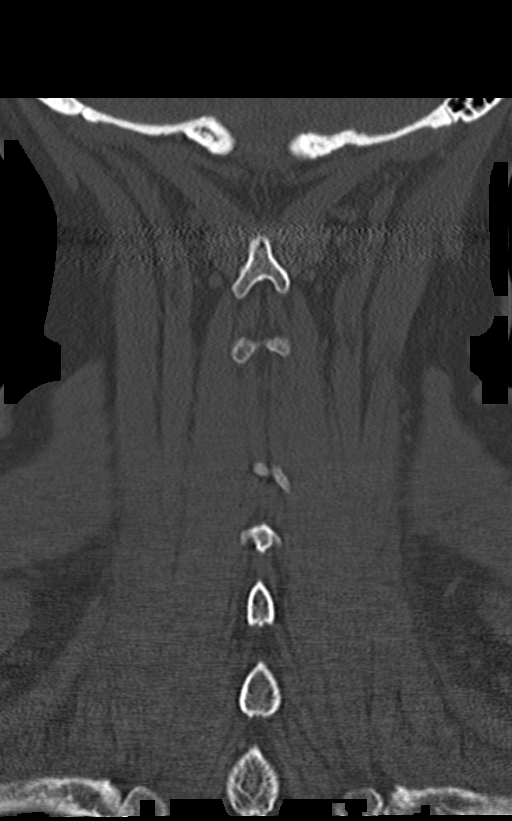

[Series 7: c_spine 2.0 sagittal · sagittal · 0.27mm/px · 5 of 52 slices shown, 6 images]
[im 18/52  bone]
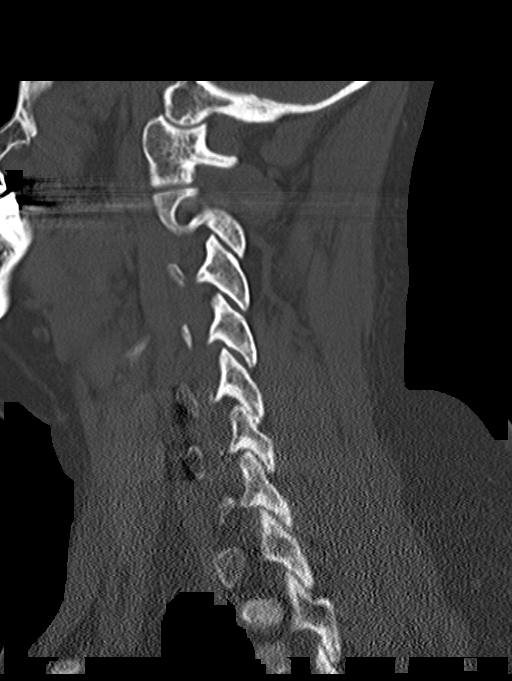
[im 22/52  bone]
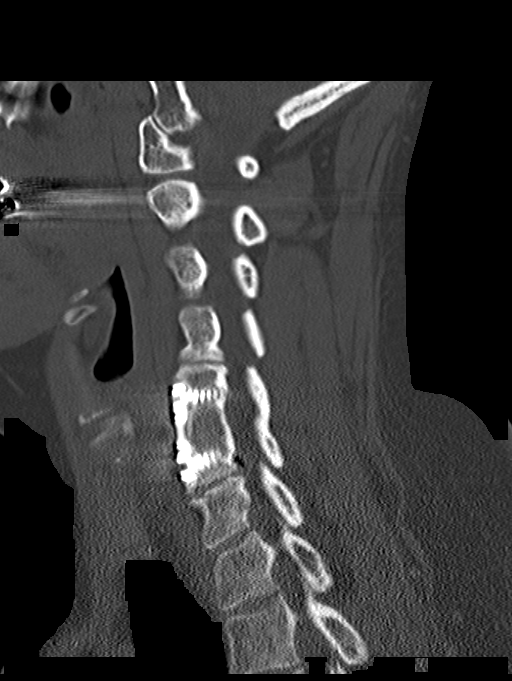
[im 26/52  soft-tissue]
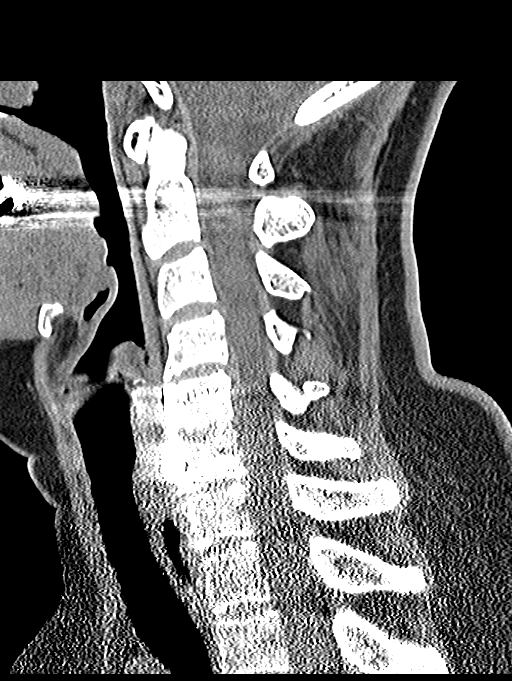
[im 26/52  bone]
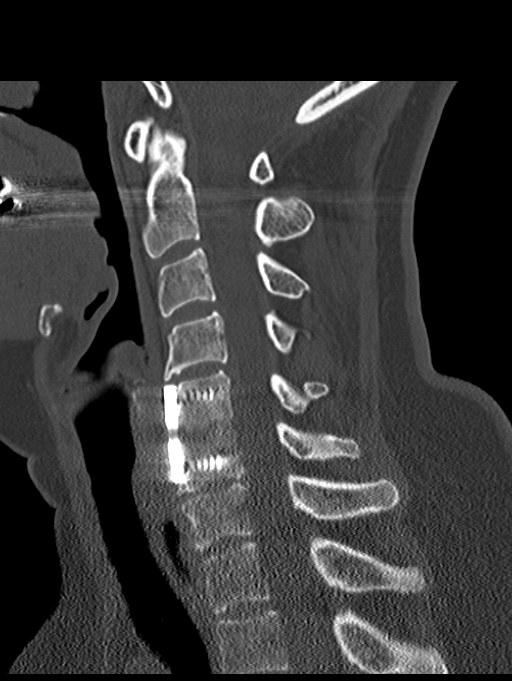
[im 30/52  bone]
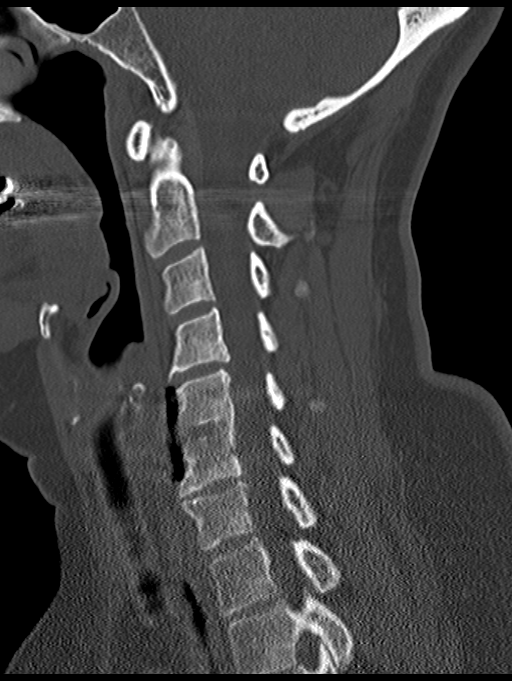
[im 35/52  bone]
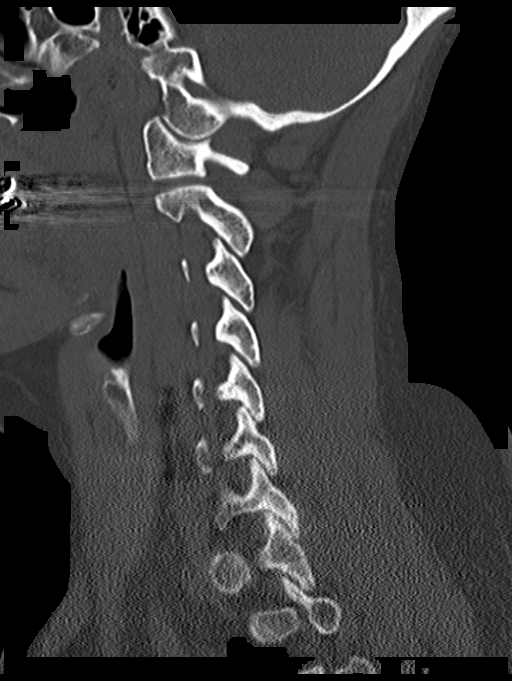

[13 of 33 positions shown; findings below may reference images not displayed]

FINDINGS: C1 to the superior endplate of T2 is imaged.

There is straightening of the expected cervical lordosis. No
anterolisthesis or retrolisthesis. The dens is normally positioned
and a lateral masses of C1. Normal atlantodental and atlantoaxial
articulations. The bilateral facets are normally aligned.

Post C5-C6 ACDF and interbody fusion without evidence of hardware
failure or loosening.

Cervical vertebral body heights are preserved. Prevertebral soft
tissues are normal.

There is mild multilevel cervical spine DDD, worse at C4-C5 and
C6-C7 with disc space height loss, endplate irregularity and
sclerosis.

Scattered shotty bilateral cervical lymph nodes are individually not
enlarged by size criteria. Normal noncontrast appearance of the
thyroid gland. Limited visualization of lung apices is normal.
IMPRESSION: 1. No fracture or static subluxation of the cervical spine.
2. Post C5-C6 ACDF and interbody fusion without evidence of hardware
failure or loosening.
3. Mild multilevel cervical spine DDD, worse at C4-C5 and C6-C7.

## 2016-07-28 ENCOUNTER — Other Ambulatory Visit: Payer: Self-pay | Admitting: Family Medicine

## 2016-08-24 ENCOUNTER — Encounter: Payer: Self-pay | Admitting: Family

## 2016-08-24 ENCOUNTER — Ambulatory Visit (INDEPENDENT_AMBULATORY_CARE_PROVIDER_SITE_OTHER)
Admission: RE | Admit: 2016-08-24 | Discharge: 2016-08-24 | Disposition: A | Discharge status: Home / Self Care | Payer: Medicare Other | Source: Ambulatory Visit | Attending: Family | Admitting: Family

## 2016-08-24 ENCOUNTER — Telehealth: Payer: Self-pay | Admitting: Family Medicine

## 2016-08-24 ENCOUNTER — Ambulatory Visit (INDEPENDENT_AMBULATORY_CARE_PROVIDER_SITE_OTHER): Payer: Medicare Other | Admitting: Family

## 2016-08-24 DIAGNOSIS — R0602 Shortness of breath: Secondary | ICD-10-CM

## 2016-08-24 MED ORDER — BUDESONIDE-FORMOTEROL FUMARATE 160-4.5 MCG/ACT IN AERO: 2.0000 | INHALATION_SPRAY | Freq: Two times a day (BID) | RESPIRATORY_TRACT | 0 refills | Status: DC

## 2016-08-24 NOTE — Assessment & Plan Note (Signed)
Continued shortness of breath and fatigue with current antibiotics and completion of prednisone dose pack. There is concern for possible pneumonia. Obtain chest x-ray. Start sample of Symbicort. Continue current dosage of Ceftin until completed pending x-ray results. OTC medications as needed for symptom relief and supportive care.

## 2016-08-24 NOTE — Patient Instructions (Addendum)
Thank you for choosing [facility].  SUMMARY AND INSTRUCTIONS:  Please continue to take the Ceftin as prescribed.  Symbicort 2 inhalations twice daily.  Additional treatment pending x-ray results as needed.   Medication:  Your prescription(s) have been submitted to your pharmacy or been printed and provided for you. Please take as directed and contact our office if you believe you are having problem(s) with the medication(s) or have any questions.  Imaging / Radiology:  Please stop by radiology on the basement level of the building for your x-rays. Your results will be released to MyChart (or called to you) after review, usually within 72 hours after test completion. If any treatments or changes are necessary, you will be notified at that same time.  Follow up:  If your symptoms worsen or fail to improve, please contact our office for further instruction, or in case of emergency go directly to the emergency room at the closest medical facility.    General Recommendations:    Please drink plenty of fluids.  Get plenty of rest   Sleep in humidified air  Use saline nasal sprays  Netti pot   OTC Medications:  Decongestants - helps relieve congestion   Flonase (generic fluticasone) or Nasacort (generic triamcinolone) - please make sure to use the "cross-over" technique at a 45 degree angle towards the opposite eye as opposed to straight up the nasal passageway.   Sudafed (generic pseudoephedrine - Note this is the one that is available behind the pharmacy counter); Products with phenylephrine (-PE) may also be used but is often not as effective as pseudoephedrine.   If you have HIGH BLOOD PRESSURE - Coricidin HBP; AVOID any product that is -D as this contains pseudoephedrine which may increase your blood pressure.  Afrin (oxymetazoline) every 6-8 hours for up to 3 days.   Allergies - helps relieve runny nose, itchy eyes and sneezing   Claritin (generic  loratidine), Allegra (fexofenidine), or Zyrtec (generic cyrterizine) for runny nose. These medications should not cause drowsiness.  Note - Benadryl (generic diphenhydramine) may be used however may cause drowsiness  Cough -   Delsym or Robitussin (generic dextromethorphan)  Expectorants - helps loosen mucus to ease removal   Mucinex (generic guaifenesin) as directed on the package.  Headaches / General Aches   Tylenol (generic acetaminophen) - DO NOT EXCEED 3 grams (3,000 mg) in a 24 hour time period  Advil/Motrin (generic ibuprofen)   Sore Throat -   Salt water gargle   Chloraseptic (generic benzocaine) spray or lozenges / Sucrets (generic dyclonine)

## 2016-08-24 NOTE — Progress Notes (Signed)
Subjective:    Patient ID: Kayla [name], female    DOB: [birth year], 56 y.o.   MRN: [id number]  Chief Complaint  Patient presents with  . Fatigue    states she is weak and tired, SOB, and has a cough x 2 weeks, has been on cefuroxime 500mg  bid for about a week and prednisone    HPI:  Kayla [name] is a 56 y.o. 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 and presents today for an acute office visit.  This is a new problem. Associated symptoms of weak, feeling tired, shortness of breath and cough have been going on for about 2 weeks. Denies fevers. She was seen at an Urgent Care and was prescribed Cefuroxime and prednisone. Reports taking the medications as prescribed and denies adverse side effects. Cough is occasionally productive. Severity is enough to cause some dizziness and lightheadedness. Cough is generally worse in the morning.   Allergies  Allergen Reactions  . Dilaudid [Hydromorphone Hcl] Itching and Other (See Comments)    Can take with Benadryl  . Hydrocodone-Homatropine Nausea And Vomiting  . Codeine Nausea And Vomiting, Rash and Other (See Comments)    Patient hasn't had it Codeine She doesn't think she has an existing allergy      Outpatient Medications Prior to Visit    Medication Sig Dispense Refill  . albuterol (PROVENTIL HFA;VENTOLIN HFA) 108 (90 Base) MCG/ACT inhaler Inhale 2 puffs into the lungs every 4 (four) hours as needed for wheezing or shortness of breath. 1 Inhaler 1  . albuterol (PROVENTIL) (2.5 MG/3ML) 0.083% nebulizer solution Take 3 mLs (2.5 mg total) by nebulization every 6 (six) hours as needed for wheezing or shortness of breath. 150 mL 1  . aspirin 81 MG tablet Take 81 mg by mouth daily. Reported on 07/15/2015    . buPROPion (WELLBUTRIN XL) 300 MG 24 hr tablet Take 1 tablet (300 mg total) by mouth daily. Taking 150 mg wellbutrin (Patient taking differently: Take 150 mg by mouth daily. ) 30 tablet 3  . clonazePAM (KLONOPIN) 0.5 MG tablet Take 1 tablet (0.5 mg total) by mouth 2 (two) times daily as needed for anxiety. 60 tablet 1  . estradiol (ESTRACE) 1 MG tablet Take 1 mg by mouth daily.    . fenofibrate (TRICOR) 145 MG tablet TAKE 1 TABLET BY MOUTH DAILY 30 tablet 0  . fluticasone (FLONASE) 50 MCG/ACT nasal spray Place 2 sprays into both nostrils 2 (two) times daily.    Marland Kitchen ibuprofen (ADVIL,MOTRIN) 200 MG tablet Take 600 mg by mouth every 6 (six) hours as needed for headache or moderate pain.    . montelukast (SINGULAIR) 10 MG tablet Take 1 tablet (10 mg total) by mouth at bedtime. 30 tablet 0  . Multiple Vitamin (MULTI VITAMIN DAILY PO) Take 1 tablet by mouth daily.     . nicotine polacrilex (NICOTINE MINI) 4 MG lozenge Take 4 mg by mouth as needed for smoking cessation.    . rizatriptan (MAXALT) 10 MG  tablet Take 1 tablet (10 mg total) by mouth as needed for migraine. May repeat in 2 hours if needed (Patient taking differently: Take 5-10 mg by mouth as needed for migraine. May repeat in 2 hours if needed) 10 tablet 2  . tiZANidine (ZANAFLEX) 4 MG tablet Take 1 tablet (4 mg total) by mouth every 6 (six) hours as needed for muscle spasms. 90 tablet 1  . traZODone (DESYREL) 100 MG tablet TAKE 1 TO 2 TABLETS(100 TO 200 MG) BY MOUTH AT BEDTIME 180  tablet 0   No facility-administered medications prior to visit.       Past Surgical History:  Procedure Laterality Date  . ABDOMINAL HYSTERECTOMY     TAH b/l SPO, Dr Loreta Ave  . ANTERIOR CERVICAL DECOMP/DISCECTOMY FUSION N/A 06/19/2015   Procedure: Removal of Codman anterior cervical plate;   Anterior Cervcial Decompression Fusion Cervical Four-Five;  Surgeon: Shirlean Kelly, MD;  Location: [facility];  Service: Neurosurgery;  Laterality: N/A;  left side approach  . APPENDECTOMY    . BREAST ENHANCEMENT SURGERY    . CHOLECYSTECTOMY    . COLONOSCOPY    . ECTOPIC PREGNANCY SURGERY     X 4 and miscarriage  . ESOPHAGOGASTRODUODENOSCOPY N/A 05/19/2012   Procedure: ESOPHAGOGASTRODUODENOSCOPY (EGD);  Surgeon: Theda Belfast, MD;  Location: [facility] ENDOSCOPY;  Service: Endoscopy;  Laterality: N/A;  . LEFT HEART CATH AND CORONARY ANGIOGRAPHY N/A 02/13/2016   Procedure: Left Heart Cath and Coronary Angiography;  Surgeon: Kathleene Hazel, MD;  Location: [facility] INVASIVE CV LAB;  Service: Cardiovascular;  Laterality: N/A;  . NECK SURGERY    . PEG PLACEMENT        Past Medical History:  Diagnosis Date  . Allergic symptoms 11/06/2015  . Anxiety   . Asthma 11/06/2015  . Chronic kidney disease    pt stated "I have Stage 3 kidney disease"  . Complication of anesthesia    Pt stated "sometimes I have trouble waking up"  . Constipation due to pain medication   . DDD (degenerative disc disease), cervical   . Depression with anxiety 07/15/2015  . Functional neurological symptom disorder with mixed symptoms 05/19/2012   2007  Started then was tripping up steps. 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 Left foot drop but has worked hard not to need AFO and is stronger now.     . G tube feedings (HCC)   . Glaucoma   . History of chicken pox 07/15/2015  . Hyperlipidemia, mixed 07/25/2015  . Migraine    Takes Imitrex  . Neck pain 05/12/2016  . Osteoarthritis of back     . Preventative health care 09/21/2015  . Primary lateral sclerosis (HCC)   . RLL pneumonia (HCC) 07/15/2015  . Shortness of breath dyspnea    with exertion  . Sinusitis 05/12/2016  . Sleep apnea 09/12/2015   H/o Bipap and VPAP use in past  . Vitamin B 12 deficiency 05/11/2016  . Vitamin D deficiency 05/11/2016      Review of Systems  Constitutional: Positive for fatigue. Negative for chills and fever.  HENT: Positive for congestion and voice change. Negative for sinus pain, sinus pressure, sneezing and sore throat.   Respiratory: Positive for cough, chest tightness and shortness of breath.   Cardiovascular: Negative for chest pain, palpitations and leg swelling.  Neurological: Positive for headaches.      Objective:    BP 132/88 (BP Location: Left Arm, Patient Position: Sitting,  Cuff Size: Normal)   Pulse 81   Temp 98 F (36.7 C) (Oral)   Resp 16   Ht 5\' 3"  (1.6 m)   Wt 146 lb 12.8 oz (66.6 kg)   SpO2 97%   BMI 26.00 kg/m  Nursing note and vital signs reviewed.  Physical Exam  Constitutional: She is oriented to person, place, and time. She appears well-developed and well-nourished. No distress.  HENT:  Right Ear: Hearing, tympanic membrane, external ear and ear canal normal.  Left Ear: Hearing, tympanic membrane, external ear and ear canal normal.  Nose: Nose normal.  Mouth/Throat: Uvula is midline, oropharynx is clear and moist and mucous membranes are normal.  Cardiovascular: Normal rate, regular rhythm and intact distal pulses.  Exam reveals no gallop and no friction rub.   No murmur heard. Pulmonary/Chest: Effort normal and breath sounds normal. No respiratory distress. She has no wheezes. She has no rales. She exhibits no tenderness.  Neurological: She is alert and oriented to person, place, and time.  Skin: Skin is warm and dry.  Psychiatric: She has a normal mood and affect. Her behavior is normal. Judgment and thought content normal.       Assessment & Plan:    Problem List Items Addressed This Visit      Other   Shortness of breath    Continued shortness of breath and fatigue with current antibiotics and completion of prednisone dose pack. There is concern for possible pneumonia. Obtain chest x-ray. Start sample of Symbicort. Continue current dosage of Ceftin until completed pending x-ray results. OTC medications as needed for symptom relief and supportive care.       Relevant Orders   DG Chest 2 View (Completed)       I am having Ms. Laux start on budesonide-formoterol. I am also having her maintain her estradiol, Multiple Vitamin (MULTI VITAMIN DAILY PO), aspirin, fluticasone, albuterol, albuterol, buPROPion, tiZANidine, rizatriptan, ibuprofen, nicotine polacrilex, clonazePAM, montelukast, fenofibrate, and traZODone.   Meds ordered this encounter  Medications  . budesonide-formoterol (SYMBICORT) 160-4.5 MCG/ACT inhaler    Sig: Inhale 2 puffs into the lungs 2 (two) times daily.    Dispense:  1 Inhaler    Refill:  0    Order Specific Question:   Supervising Provider    Answer:   Hillard Danker A [[id number]]    Follow-up: Return if symptoms worsen or fail to improve.  Jeanine Luz, FNP

## 2016-08-24 NOTE — Telephone Encounter (Signed)
Patient Name: Kayla [name] DOB: 10-Mar-1960 Initial Comment Caller states she's having trouble breathing, and wheezing. States she's a smoker and coughing. Pain in her back and shoulder area. No energy. Nurse Assessment Nurse: Yetta Barre, RN, Miranda Date/Time (Eastern Time): 08/24/2016 12:21:59 PM Confirm and document reason for call. If symptomatic, describe symptoms. ---Caller states she has a cough, wheezing, and breathing problems for 2-3 weeks. She was out of town and seen in UC 1 week ago, prescribed prednisone and antibiotics. Albuterol Q2-3 hrs (last dose of inhaler 2 hrs ago). Does the patient have any new or worsening symptoms? ---Yes Will a triage be completed? ---Yes Related visit to physician within the last 2 weeks? ---No Does the PT have any chronic conditions? (i.e. diabetes, asthma, etc.) ---Yes List chronic conditions. ---Asthma Is this a behavioral health or substance abuse call? ---No Guidelines Guideline Title Affirmed Question Affirmed Notes Asthma Attack Asthma medicine (nebulizer or inhaler) is needed more frequently than q 4 hours to keep you comfortable Final Disposition User Go to ED Now (or PCP triage) Yetta Barre, RN, Miranda Comments No appt available with PCP or at primary office. Appt scheduled with Dr. Carver Fila at [facility] at 1:45 Referrals GO TO FACILITY OTHER - SPECIFY Disagree/Comply: Comply

## 2016-08-25 ENCOUNTER — Other Ambulatory Visit: Payer: Self-pay | Admitting: Neurosurgery

## 2016-08-27 ENCOUNTER — Other Ambulatory Visit: Payer: Self-pay | Admitting: Neurosurgery

## 2016-08-27 DIAGNOSIS — R5381 Other malaise: Secondary | ICD-10-CM

## 2016-08-27 DIAGNOSIS — Z8739 Personal history of other diseases of the musculoskeletal system and connective tissue: Secondary | ICD-10-CM

## 2016-09-02 ENCOUNTER — Encounter: Payer: Self-pay | Admitting: Family Medicine

## 2016-09-02 ENCOUNTER — Ambulatory Visit: Payer: Self-pay | Admitting: Family Medicine

## 2016-09-02 ENCOUNTER — Ambulatory Visit (INDEPENDENT_AMBULATORY_CARE_PROVIDER_SITE_OTHER): Payer: Medicare Other | Admitting: Family Medicine

## 2016-09-02 VITALS — BP 110/48 | HR 61 | Temp 98.4°F | Ht 63.0 in | Wt 144.6 lb

## 2016-09-02 DIAGNOSIS — M810 Age-related osteoporosis without current pathological fracture: Secondary | ICD-10-CM | POA: Diagnosis not present

## 2016-09-02 DIAGNOSIS — M8580 Other specified disorders of bone density and structure, unspecified site: Secondary | ICD-10-CM

## 2016-09-02 DIAGNOSIS — R197 Diarrhea, unspecified: Secondary | ICD-10-CM

## 2016-09-02 DIAGNOSIS — R739 Hyperglycemia, unspecified: Secondary | ICD-10-CM

## 2016-09-02 DIAGNOSIS — E782 Mixed hyperlipidemia: Secondary | ICD-10-CM

## 2016-09-02 HISTORY — DX: Other specified disorders of bone density and structure, unspecified site: M85.80

## 2016-09-02 HISTORY — DX: Age-related osteoporosis without current pathological fracture: M81.0

## 2016-09-02 MED ORDER — TRAZODONE HCL 100 MG PO TABS: ORAL_TABLET | ORAL | 0 refills | Status: DC

## 2016-09-02 MED ORDER — FENOFIBRATE 145 MG PO TABS: 145.0000 mg | ORAL_TABLET | Freq: Every day | ORAL | 0 refills | Status: DC

## 2016-09-02 MED ORDER — CLONAZEPAM 0.5 MG PO TABS: 0.5000 mg | ORAL_TABLET | Freq: Two times a day (BID) | ORAL | 1 refills | Status: DC | PRN

## 2016-09-02 MED ORDER — TIZANIDINE HCL 4 MG PO TABS: 4.0000 mg | ORAL_TABLET | Freq: Four times a day (QID) | ORAL | 1 refills | Status: DC | PRN

## 2016-09-02 NOTE — Assessment & Plan Note (Signed)
Encouraged heart healthy diet, increase exercise, avoid trans fats, consider a krill oil cap daily 

## 2016-09-02 NOTE — Patient Instructions (Signed)

## 2016-09-02 NOTE — Assessment & Plan Note (Signed)
Recommend calcium intake of 1200 to 1500 mg daily, divided into roughly 3 doses. Best source is the diet and a single dairy serving is about 500 mg, a supplement of calcium citrate once or twice daily to balance diet is fine if not getting enough in diet. Also need Vitamin D 2000 IU caps, 1 cap daily if not already taking vitamin D. Also recommend weight baring exercise on hips and upper body to keep bones strong 

## 2016-09-02 NOTE — Assessment & Plan Note (Signed)
hgba1c acceptable, minimize simple carbs. Increase exercise as tolerated.  

## 2016-09-03 ENCOUNTER — Telehealth: Payer: Self-pay

## 2016-09-03 LAB — HEMOGLOBIN A1C: Hgb A1c MFr Bld: 5.7 % (ref 4.6–6.5)

## 2016-09-03 LAB — CBC WITH DIFFERENTIAL/PLATELET
BASOS ABS: 0.1 10*3/uL (ref 0.0–0.1)
Basophils Relative: 1.1 % (ref 0.0–3.0)
EOS PCT: 2.7 % (ref 0.0–5.0)
Eosinophils Absolute: 0.2 10*3/uL (ref 0.0–0.7)
HEMATOCRIT: 39.5 % (ref 36.0–46.0)
Hemoglobin: 12.8 g/dL (ref 12.0–15.0)
LYMPHS ABS: 2.2 10*3/uL (ref 0.7–4.0)
LYMPHS PCT: 34.2 % (ref 12.0–46.0)
MCHC: 32.5 g/dL (ref 30.0–36.0)
MCV: 92.4 fl (ref 78.0–100.0)
MONOS PCT: 8.6 % (ref 3.0–12.0)
Monocytes Absolute: 0.6 10*3/uL (ref 0.1–1.0)
NEUTROS ABS: 3.5 10*3/uL (ref 1.4–7.7)
NEUTROS PCT: 53.4 % (ref 43.0–77.0)
PLATELETS: 261 10*3/uL (ref 150.0–400.0)
RBC: 4.27 Mil/uL (ref 3.87–5.11)
RDW: 13.6 % (ref 11.5–15.5)
WBC: 6.5 10*3/uL (ref 4.0–10.5)

## 2016-09-03 LAB — COMPREHENSIVE METABOLIC PANEL
ALK PHOS: 35 U/L — AB (ref 39–117)
ALT: 16 U/L (ref 0–35)
AST: 23 U/L (ref 0–37)
Albumin: 4.5 g/dL (ref 3.5–5.2)
BILIRUBIN TOTAL: 0.4 mg/dL (ref 0.2–1.2)
BUN: 11 mg/dL (ref 6–23)
CALCIUM: 11 mg/dL — AB (ref 8.4–10.5)
CO2: 27 mEq/L (ref 19–32)
Chloride: 101 mEq/L (ref 96–112)
Creatinine, Ser: 1.2 mg/dL (ref 0.40–1.20)
GFR: 49.31 mL/min — AB (ref >60.00)
GLUCOSE: 72 mg/dL (ref 70–99)
POTASSIUM: 4 meq/L (ref 3.5–5.1)
Sodium: 134 mEq/L — ABNORMAL LOW (ref 135–145)
TOTAL PROTEIN: 6.8 g/dL (ref 6.0–8.3)

## 2016-09-03 LAB — LIPID PANEL
Cholesterol: 215 mg/dL — ABNORMAL HIGH (ref 0–200)
HDL: 78.6 mg/dL (ref >39.00)
LDL Cholesterol: 125 mg/dL — ABNORMAL HIGH (ref 0–99)
NonHDL: 136.32
TRIGLYCERIDES: 59 mg/dL (ref 0.0–149.0)
Total CHOL/HDL Ratio: 3
VLDL: 11.8 mg/dL (ref 0.0–40.0)

## 2016-09-03 LAB — TSH: TSH: 0.97 u[IU]/mL (ref 0.35–4.50)

## 2016-09-03 LAB — SEDIMENTATION RATE: Sed Rate: 1 mm/hr (ref 0–30)

## 2016-09-03 NOTE — Telephone Encounter (Signed)
Orders placed for PTH & Vit-Shaine Mount with Hypercalcemia per SB lab results/pt states she will come next week to have done but will call/thx dmf

## 2016-09-06 DIAGNOSIS — R197 Diarrhea, unspecified: Secondary | ICD-10-CM | POA: Insufficient documentation

## 2016-09-06 NOTE — Progress Notes (Signed)
Patient ID: Kayla CalixLisa [name], female   DOB: 1960/07/30, 56 y.o.   MRN: [id number]   Subjective:    Patient ID: Kayla CalixLisa [name], female    DOB: 1960/07/30, 56 y.o.   MRN: [id number]  Chief Complaint  Patient presents with  . Follow-up    Patient is here today for a 3 month F/U with labs. She also states that she has had a sort of stomach bug and is unable to eat much.  Was vomiting on Sunday which since has turned to diarrhea.  Also; she went to Neurosurgeon and is post surgery [id number].  They are fitting her with a neck surgery due to it not healing properly.  New Dx of Osteoporosis.    HPI Patient is in today for follow up. She is noting a recent increase in stool frequency over past several weeks. No bloody or tarry stool. Note an increase in malaise but denies fevers or chills. No nausea or vomiting. No contacts with similar symptoms. She under went surgery with neurosurgeon in June of 2017 but still has some pain. They have not been able to get the bones to heal properly so she continues to follow closely with neurosurgery. Denies CP/palp/SOB/HA/congestion/fevers/GI or GU c/o. Taking meds as prescribed  Past Medical History:  Diagnosis Date  . Allergic symptoms 11/06/2015  . Anxiety   . Asthma 11/06/2015  . Chronic kidney disease    pt stated "I have Stage 3 kidney disease"  . Complication of anesthesia    Pt stated "sometimes I have trouble waking up"  . Constipation due to pain medication   . DDD (degenerative disc disease), cervical   . Depression with anxiety 07/15/2015  . Functional neurological symptom disorder with mixed symptoms 05/19/2012   2007  Started then was tripping up steps. 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 Left foot drop but has worked hard not to need AFO and is stronger now.     . G tube feedings (HCC)   . Glaucoma   . History of chicken pox 07/15/2015  . Hyperlipidemia, mixed 07/25/2015  . Migraine    Takes Imitrex  . Neck pain  05/12/2016  . Osteoarthritis of back   . Osteoporosis 09/02/2016  . Preventative health care 09/21/2015  . Primary lateral sclerosis (HCC)   . RLL pneumonia (HCC) 07/15/2015  . Shortness of breath dyspnea    with exertion  . Sinusitis 05/12/2016  . Sleep apnea 09/12/2015   H/o Bipap and VPAP use in past  . Vitamin B 12 deficiency 05/11/2016  . Vitamin D deficiency 05/11/2016    Past Surgical History:  Procedure Laterality Date  . ABDOMINAL HYSTERECTOMY     TAH b/l SPO, Dr Loreta AveWagner  . ANTERIOR CERVICAL DECOMP/DISCECTOMY FUSION N/A 06/19/2015   Procedure: Removal of Codman anterior cervical plate;   Anterior Cervcial Decompression Fusion Cervical Four-Five;  Surgeon: Shirlean Kellyobert Nudelman, MD;  Location: [facility];  Service: Neurosurgery;  Laterality: N/A;  left side approach  . APPENDECTOMY    . BREAST ENHANCEMENT SURGERY    . CHOLECYSTECTOMY    . COLONOSCOPY    . ECTOPIC PREGNANCY SURGERY     X 4 and miscarriage  . ESOPHAGOGASTRODUODENOSCOPY N/A 05/19/2012   Procedure: ESOPHAGOGASTRODUODENOSCOPY (EGD);  Surgeon: Theda BelfastPatrick D Hung, MD;  [facility];  Service: Endoscopy;  Laterality: N/A;  . LEFT HEART CATH AND CORONARY ANGIOGRAPHY N/A 02/13/2016   Procedure: Left Heart Cath and Coronary Angiography;  Surgeon: Cristal Deerhristopher  Ellene Route, MD;  Location: [facility] INVASIVE CV LAB;  Service: Cardiovascular;  Laterality: N/A;  . NECK SURGERY    . PEG PLACEMENT      Family History  Problem Relation Age of Onset  . Hypertension Brother   . Alcohol abuse Brother   . Alcohol abuse Mother   . Cancer Mother        pancreatic  . Heart disease Father   . Alcohol abuse Father   . Heart disease Paternal Uncle   . Alcohol abuse Paternal Uncle     Social History   Social History  . Marital status: Married    Spouse name: N/A  . Number of children: N/A  . Years of education: college   Occupational History  . 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 Smoking status: Former  Smoker    Packs/day: 0.50    Years: 30.00    Types: Cigarettes    Quit date: 01/05/2016  . Smokeless tobacco: Never Used  . Alcohol use Yes     Comment: occasional  . Drug use: No  . Sexual activity: Yes    Birth control/ protection: Surgical   Other Topics Concern  . Not on file   Social History Narrative   Lives with boyfriend, widowed, then divorced, remarried 2017. No major dietary restrictions. 1/2 ppd, drinks a small amount socially    Outpatient Medications Prior to Visit  Medication Sig Dispense Refill  . albuterol (PROVENTIL HFA;VENTOLIN HFA) 108 (90 Base) MCG/ACT inhaler Inhale 2 puffs into the lungs every 4 (four) hours as needed for wheezing or shortness of breath. 1 Inhaler 1  . albuterol (PROVENTIL) (2.5 MG/3ML) 0.083% nebulizer solution Take 3 mLs (2.5 mg total) by nebulization every 6 (six) hours as needed for wheezing or shortness of breath. 150 mL 1  . aspirin 81 MG tablet Take 81 mg by mouth daily. Reported on 07/15/2015    . budesonide-formoterol (SYMBICORT) 160-4.5 MCG/ACT inhaler Inhale 2 puffs into the lungs 2 (two) times daily. 1 Inhaler 0  . buPROPion (WELLBUTRIN XL) 300 MG 24 hr tablet Take 1 tablet (300 mg total) by mouth daily. Taking 150 mg wellbutrin (Patient taking differently: Take 150 mg by mouth daily. ) 30 tablet 3  . estradiol (ESTRACE) 1 MG tablet Take 1 mg by mouth daily.    . fluticasone (FLONASE) 50 MCG/ACT nasal spray Place 2 sprays into both nostrils 2 (two) times daily.    Marland Kitchen ibuprofen (ADVIL,MOTRIN) 200 MG tablet Take 600 mg by mouth every 6 (six) hours as needed for headache or moderate pain.    . montelukast (SINGULAIR) 10 MG tablet Take 1 tablet (10 mg total) by mouth at bedtime. 30 tablet 0  . Multiple Vitamin (MULTI VITAMIN DAILY PO) Take 1 tablet by mouth daily.     . clonazePAM (KLONOPIN) 0.5 MG tablet Take 1 tablet (0.5 mg total) by mouth 2 (two) times daily as needed for anxiety. 60 tablet 1  . fenofibrate (TRICOR) 145 MG tablet TAKE 1  TABLET BY MOUTH DAILY 30 tablet 0  . tiZANidine (ZANAFLEX) 4 MG tablet Take 1 tablet (4 mg total) by mouth every 6 (six) hours as needed for muscle spasms. 90 tablet 1  . traZODone (DESYREL) 100 MG tablet TAKE 1 TO 2 TABLETS(100 TO 200 MG) BY MOUTH AT BEDTIME 180 tablet 0  . nicotine polacrilex (NICOTINE MINI) 4 MG lozenge Take 4 mg by mouth as needed for smoking cessation.    Marland Kitchen  rizatriptan (MAXALT) 10 MG tablet Take 1 tablet (10 mg total) by mouth as needed for migraine. May repeat in 2 hours if needed (Patient taking differently: Take 5-10 mg by mouth as needed for migraine. May repeat in 2 hours if needed) 10 tablet 2   No facility-administered medications prior to visit.     Allergies  Allergen Reactions  . Dilaudid [Hydromorphone Hcl] Itching and Other (See Comments)    Can take with Benadryl  . Hydrocodone-Homatropine Nausea And Vomiting  . Codeine Nausea And Vomiting, Rash and Other (See Comments)    Patient hasn't had it Codeine She doesn't think she has an existing allergy    ROS     Objective:    Physical Exam  BP (!) 110/48 (BP Location: Left Arm, Patient Position: Sitting, Cuff Size: Normal)   Pulse 61   Temp 98.4 F (36.9 C) (Oral)   Ht 5\' 3"  (1.6 m)   Wt 144 lb 9.6 oz (65.6 kg)   SpO2 99%   BMI 25.61 kg/m  Wt Readings from Last 3 Encounters:  09/02/16 144 lb 9.6 oz (65.6 kg)  08/24/16 146 lb 12.8 oz (66.6 kg)  05/11/16 139 lb (63 kg)     Lab Results  Component Value Date   WBC 6.5 09/02/2016   HGB 12.8 09/02/2016   HCT 39.5 09/02/2016   PLT 261.0 09/02/2016   GLUCOSE 72 09/02/2016   CHOL 215 (H) 09/02/2016   TRIG 59.0 09/02/2016   HDL 78.60 09/02/2016   LDLCALC 125 (H) 09/02/2016   ALT 16 09/02/2016   AST 23 09/02/2016   NA 134 (L) 09/02/2016   K 4.0 09/02/2016   CL 101 09/02/2016   CREATININE 1.20 09/02/2016   BUN 11 09/02/2016   CO2 27 09/02/2016   TSH 0.97 09/02/2016   INR 1.03 02/13/2016   HGBA1C 5.7 09/02/2016    Lab Results    Component Value Date   TSH 0.97 09/02/2016   Lab Results  Component Value Date   WBC 6.5 09/02/2016   HGB 12.8 09/02/2016   HCT 39.5 09/02/2016   MCV 92.4 09/02/2016   PLT 261.0 09/02/2016   Lab Results  Component Value Date   NA 134 (L) 09/02/2016   K 4.0 09/02/2016   CO2 27 09/02/2016   GLUCOSE 72 09/02/2016   BUN 11 09/02/2016   CREATININE 1.20 09/02/2016   BILITOT 0.4 09/02/2016   ALKPHOS 35 (L) 09/02/2016   AST 23 09/02/2016   ALT 16 09/02/2016   PROT 6.8 09/02/2016   ALBUMIN 4.5 09/02/2016   CALCIUM 11.0 (H) 09/02/2016   ANIONGAP 7 02/12/2016   GFR 49.31 (L) 09/02/2016   Lab Results  Component Value Date   CHOL 215 (H) 09/02/2016   Lab Results  Component Value Date   HDL 78.60 09/02/2016   Lab Results  Component Value Date   LDLCALC 125 (H) 09/02/2016   Lab Results  Component Value Date   TRIG 59.0 09/02/2016   Lab Results  Component Value Date   CHOLHDL 3 09/02/2016   Lab Results  Component Value Date   HGBA1C 5.7 09/02/2016       Assessment & Plan:   Problem List Items Addressed This Visit    Hyperglycemia    hgba1c acceptable, minimize simple carbs. Increase exercise as tolerated.       Relevant Orders   Hemoglobin A1c (Completed)   Hyperlipidemia, mixed    Encouraged heart healthy diet, increase exercise, avoid trans fats, consider a krill  oil cap daily      Relevant Medications   fenofibrate (TRICOR) 145 MG tablet   Other Relevant Orders   Lipid panel (Completed)   Osteoporosis    Recommend calcium intake of 1200 to 1500 mg daily, divided into roughly 3 doses. Best source is the diet and a single dairy serving is about 500 mg, a supplement of calcium citrate once or twice daily to balance diet is fine if not getting enough in diet. Also need Vitamin D 2000 IU caps, 1 cap daily if not already taking vitamin D. Also recommend weight baring exercise on hips and upper body to keep bones strong      Diarrhea - Primary    Worse over  several weeks. No bloody or tarry stool. Will proceed with stool cutlures due to several weeks of 5-6 loose stool daily.      Relevant Orders   CBC with Differential/Platelet (Completed)   Comprehensive metabolic panel (Completed)   TSH (Completed)   Stool Culture   Stool, WBC/Lactoferrin   Stool C-Diff Toxin Assay   Ova and parasite examination   Sedimentation rate (Completed)   Clostridium Difficile by PCR      I have discontinued Ms. Zaborowski's rizatriptan and nicotine polacrilex. I have also changed her fenofibrate. Additionally, I am having her maintain her estradiol, Multiple Vitamin (MULTI VITAMIN DAILY PO), aspirin, fluticasone, albuterol, albuterol, buPROPion, ibuprofen, montelukast, budesonide-formoterol, SUMAtriptan, clonazePAM, tiZANidine, and traZODone.  Meds ordered this encounter  Medications  . SUMAtriptan (IMITREX) 100 MG tablet    Refill:  3  . clonazePAM (KLONOPIN) 0.5 MG tablet    Sig: Take 1 tablet (0.5 mg total) by mouth 2 (two) times daily as needed for anxiety.    Dispense:  60 tablet    Refill:  1  . fenofibrate (TRICOR) 145 MG tablet    Sig: Take 1 tablet (145 mg total) by mouth daily.    Dispense:  30 tablet    Refill:  0  . tiZANidine (ZANAFLEX) 4 MG tablet    Sig: Take 1 tablet (4 mg total) by mouth every 6 (six) hours as needed for muscle spasms.    Dispense:  90 tablet    Refill:  1  . traZODone (DESYREL) 100 MG tablet    Sig: TAKE 1 TO 2 TABLETS(100 TO 200 MG) BY MOUTH AT BEDTIME    Dispense:  180 tablet    Refill:  0     Danise Edge, MD

## 2016-09-06 NOTE — Assessment & Plan Note (Signed)
Worse over several weeks. No bloody or tarry stool. Will proceed with stool cutlures due to several weeks of 5-6 loose stool daily.

## 2016-09-07 ENCOUNTER — Ambulatory Visit (INDEPENDENT_AMBULATORY_CARE_PROVIDER_SITE_OTHER): Payer: Medicare Other | Admitting: Family Medicine

## 2016-09-07 DIAGNOSIS — F418 Other specified anxiety disorders: Secondary | ICD-10-CM | POA: Diagnosis not present

## 2016-09-07 DIAGNOSIS — R739 Hyperglycemia, unspecified: Secondary | ICD-10-CM

## 2016-09-07 DIAGNOSIS — E559 Vitamin D deficiency, unspecified: Secondary | ICD-10-CM

## 2016-09-07 DIAGNOSIS — M8580 Other specified disorders of bone density and structure, unspecified site: Secondary | ICD-10-CM

## 2016-09-08 LAB — PARATHYROID HORMONE, INTACT (NO CA): PTH: 32 pg/mL (ref 14–64)

## 2016-09-09 ENCOUNTER — Ambulatory Visit
Admission: RE | Admit: 2016-09-09 | Discharge: 2016-09-09 | Disposition: A | Discharge status: Home / Self Care | Payer: Medicare Other | Source: Ambulatory Visit | Attending: Neurosurgery | Admitting: Neurosurgery

## 2016-09-09 DIAGNOSIS — Z8739 Personal history of other diseases of the musculoskeletal system and connective tissue: Secondary | ICD-10-CM

## 2016-09-10 ENCOUNTER — Encounter: Payer: Self-pay | Admitting: Family Medicine

## 2016-09-11 LAB — VITAMIN D 1,25 DIHYDROXY
Vitamin D 1, 25 (OH)2 Total: 52 pg/mL (ref 18–72)
Vitamin D3 1, 25 (OH)2: 52 pg/mL

## 2016-09-12 ENCOUNTER — Encounter: Payer: Self-pay | Admitting: Family Medicine

## 2016-09-12 NOTE — Assessment & Plan Note (Signed)
Encouraged to get adequate exercise, calcium and vitamin d intake. Will not increase calcium intake due to her hypercalcemia with recent blood draw. Will repeat cmp in 2-4 weeks.

## 2016-09-12 NOTE — Assessment & Plan Note (Signed)
WNL with blood draw take daily supplements.

## 2016-09-12 NOTE — Patient Instructions (Signed)
Vitamin D Deficiency °Vitamin D deficiency is when your body does not have enough vitamin D. Vitamin D is important to your body for many reasons: °· It helps the body to absorb two important minerals, called calcium and phosphorus. °· It plays a role in bone health. °· It may help to prevent some diseases, such as diabetes and multiple sclerosis. °· It plays a role in muscle function, including heart function. ° °You can get vitamin D by: °· Eating foods that naturally contain vitamin D. °· Eating or drinking milk or other dairy products that have vitamin D added to them. °· Taking a vitamin D supplement or a multivitamin supplement that contains vitamin D. °· Being in the sun. Your body naturally makes vitamin D when your skin is exposed to sunlight. Your body changes the sunlight into a form of the vitamin that the body can use. ° °If vitamin D deficiency is severe, it can cause a condition in which your bones become soft. In adults, this condition is called osteomalacia. In children, this condition is called rickets. °What are the causes? °Vitamin D deficiency may be caused by: °· Not eating enough foods that contain vitamin D. °· Not getting enough sun exposure. °· Having certain digestive system diseases that make it difficult for your body to absorb vitamin D. These diseases include Crohn disease, chronic pancreatitis, and cystic fibrosis. °· Having a surgery in which a part of the stomach or a part of the small intestine is removed. °· Being obese. °· Having chronic kidney disease or liver disease. ° °What increases the risk? °This condition is more likely to develop in: °· Older people. °· People who do not spend much time outdoors. °· People who live in a long-term care facility. °· People who have had broken bones. °· People with weak or thin bones (osteoporosis). °· People who have a disease or condition that changes how the body absorbs vitamin D. °· People who have dark skin. °· People who take certain  medicines, such as steroid medicines or certain seizure medicines. °· People who are overweight or obese. ° °What are the signs or symptoms? °In mild cases of vitamin D deficiency, there may not be any symptoms. If the condition is severe, symptoms may include: °· Bone pain. °· Muscle pain. °· Falling often. °· Broken bones caused by a minor injury. ° °How is this diagnosed? °This condition is usually diagnosed with a blood test. °How is this treated? °Treatment for this condition may depend on what caused the condition. Treatment options include: °· Taking vitamin D supplements. °· Taking a calcium supplement. Your health care provider will suggest what dose is best for you. ° °Follow these instructions at home: °· Take medicines and supplements only as told by your health care provider. °· Eat foods that contain vitamin D. Choices include: °? Fortified dairy products, cereals, or juices. Fortified means that vitamin D has been added to the food. Check the label on the package to be sure. °? Fatty fish, such as salmon or trout. °? Eggs. °? Oysters. °· Do not use a tanning bed. °· Maintain a healthy weight. Lose weight, if needed. °· Keep all follow-up visits as told by your health care provider. This is important. °Contact a health care provider if: °· Your symptoms do not go away. °· You feel like throwing up (nausea) or you throw up (vomit). °· You have fewer bowel movements than usual or it is difficult for you to have a   bowel movement (constipation). °This information is not intended to replace advice given to you by your health care provider. Make sure you discuss any questions you have with your health care provider. °Document Released: 03/15/2011 Document Revised: 06/04/2015 Document Reviewed: 05/08/2014 °Elsevier Interactive Patient Education © 2018 [employer] ° °

## 2016-09-12 NOTE — Assessment & Plan Note (Signed)
Struggling with fatigue but overall feels her meds are adequate.

## 2016-09-12 NOTE — Assessment & Plan Note (Signed)
minimize simple carbs. Increase exercise as tolerated.  

## 2016-09-12 NOTE — Progress Notes (Signed)
Patient ID: Kayla [name], female   DOB: July 15, 1960, 56 y.o.   MRN: [id number]   Subjective:    Patient ID: Kayla [name], female    DOB: May 17, 1960, 56 y.o.   MRN: [id number]  Chief Complaint  Patient presents with  . Labs Only    HPI Patient is in today for follow up. She is struggling with persistent pain and malaise. No recent febrile illness or hospitalization. Continues to struggle with anhedonia but no suicidal ideation. She feels her Wellbutrin and Klonopin are adequate. Does not want to change meds. Denies CP/palp/SOB/HA/congestion/fevers/GI or GU c/o. Taking meds as prescribed  Past Medical History:  Diagnosis Date  . Allergic symptoms 11/06/2015  . Anxiety   . Asthma 11/06/2015  . Chronic kidney disease    pt stated "I have Stage 3 kidney disease"  . Complication of anesthesia    Pt stated "sometimes I have trouble waking up"  . Constipation due to pain medication   . DDD (degenerative disc disease), cervical   . Depression with anxiety 07/15/2015  . Functional neurological symptom disorder with mixed symptoms 05/19/2012   2007  Started then was tripping up steps. 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 Left foot drop but has worked hard not to need AFO and is stronger now.     . G tube feedings (HCC)   . Glaucoma   . History of chicken pox 07/15/2015  . Hyperlipidemia, mixed 07/25/2015  . Migraine    Takes Imitrex  . Neck pain 05/12/2016  . Osteoarthritis of back   . Osteopenia 09/02/2016  . Osteoporosis 09/02/2016  . Preventative health care 09/21/2015  . Primary lateral sclerosis (HCC)   . RLL pneumonia (HCC) 07/15/2015  . Shortness of breath dyspnea    with exertion  . Sinusitis 05/12/2016  . Sleep apnea 09/12/2015   H/o Bipap and VPAP use in past  . Vitamin B 12 deficiency 05/11/2016  . Vitamin D deficiency 05/11/2016    Past Surgical History:  Procedure Laterality Date  . ABDOMINAL HYSTERECTOMY     TAH b/l SPO, Dr [address]  . ANTERIOR  CERVICAL DECOMP/DISCECTOMY FUSION N/A 06/19/2015   Procedure: Removal of Codman anterior cervical plate;   Anterior Cervcial Decompression Fusion Cervical Four-Five;  Surgeon: Shirlean Kelly, MD;  Location: [facility];  Service: Neurosurgery;  Laterality: N/A;  left side approach  . APPENDECTOMY    . BREAST ENHANCEMENT SURGERY    . CHOLECYSTECTOMY    . COLONOSCOPY    . ECTOPIC PREGNANCY SURGERY     X 4 and miscarriage  . ESOPHAGOGASTRODUODENOSCOPY N/A 05/19/2012   Procedure: ESOPHAGOGASTRODUODENOSCOPY (EGD);  Surgeon: Theda Belfast, MD;  Location: [facility] ENDOSCOPY;  Service: Endoscopy;  Laterality: N/A;  . LEFT HEART CATH AND CORONARY ANGIOGRAPHY N/A 02/13/2016   Procedure: Left Heart Cath and Coronary Angiography;  Surgeon: Kathleene Hazel, MD;  Location: [facility] INVASIVE CV LAB;  Service: Cardiovascular;  Laterality: N/A;  . NECK SURGERY    . PEG PLACEMENT      Family History  Problem Relation Age of Onset  . Hypertension Brother   . Alcohol abuse Brother   . Alcohol abuse Mother   . Cancer Mother        pancreatic  . Heart disease Father   . Alcohol abuse Father   . Heart disease Paternal Uncle   . Alcohol abuse Paternal Uncle     Social History   Social History  .  Marital status: Married    Spouse name: N/A  . Number of children: N/A  . Years of education: college   Occupational History  . 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 Smoking status: Former Smoker    Packs/day: 0.50    Years: 30.00    Types: Cigarettes    Quit date: 01/05/2016  . Smokeless tobacco: Never Used  . Alcohol use Yes     Comment: occasional  . Drug use: No  . Sexual activity: Yes    Birth control/ protection: Surgical   Other Topics Concern  . Not on file   Social History Narrative   Lives with boyfriend, widowed, then divorced, remarried 2017. No major dietary restrictions. 1/2 ppd, drinks a small amount socially    Outpatient Medications Prior to Visit    Medication Sig Dispense Refill  . albuterol (PROVENTIL HFA;VENTOLIN HFA) 108 (90 Base) MCG/ACT inhaler Inhale 2 puffs into the lungs every 4 (four) hours as needed for wheezing or shortness of breath. 1 Inhaler 1  . albuterol (PROVENTIL) (2.5 MG/3ML) 0.083% nebulizer solution Take 3 mLs (2.5 mg total) by nebulization every 6 (six) hours as needed for wheezing or shortness of breath. 150 mL 1  . aspirin 81 MG tablet Take 81 mg by mouth daily. Reported on 07/15/2015    . budesonide-formoterol (SYMBICORT) 160-4.5 MCG/ACT inhaler Inhale 2 puffs into the lungs 2 (two) times daily. 1 Inhaler 0  . buPROPion (WELLBUTRIN XL) 300 MG 24 hr tablet Take 1 tablet (300 mg total) by mouth daily. Taking 150 mg wellbutrin (Patient taking differently: Take 150 mg by mouth daily. ) 30 tablet 3  . clonazePAM (KLONOPIN) 0.5 MG tablet Take 1 tablet (0.5 mg total) by mouth 2 (two) times daily as needed for anxiety. 60 tablet 1  . estradiol (ESTRACE) 1 MG tablet Take 1 mg by mouth daily.    . fenofibrate (TRICOR) 145 MG tablet Take 1 tablet (145 mg total) by mouth daily. 30 tablet 0  . fluticasone (FLONASE) 50 MCG/ACT nasal spray Place 2 sprays into both nostrils 2 (two) times daily.    Marland Kitchen. ibuprofen (ADVIL,MOTRIN) 200 MG tablet Take 600 mg by mouth every 6 (six) hours as needed for headache or moderate pain.    . montelukast (SINGULAIR) 10 MG tablet Take 1 tablet (10 mg total) by mouth at bedtime. 30 tablet 0  . Multiple Vitamin (MULTI VITAMIN DAILY PO) Take 1 tablet by mouth daily.     . SUMAtriptan (IMITREX) 100 MG tablet   3  . tiZANidine (ZANAFLEX) 4 MG tablet Take 1 tablet (4 mg total) by mouth every 6 (six) hours as needed for muscle spasms. 90 tablet 1  . traZODone (DESYREL) 100 MG tablet TAKE 1 TO 2 TABLETS(100 TO 200 MG) BY MOUTH AT BEDTIME 180 tablet 0   No facility-administered medications prior to visit.     Allergies  Allergen Reactions  . Dilaudid [Hydromorphone Hcl] Itching and Other (See Comments)     Can take with Benadryl  . Hydrocodone-Homatropine Nausea And Vomiting  . Codeine Nausea And Vomiting, Rash and Other (See Comments)    Patient hasn't had it Codeine She doesn't think she has an existing allergy    Review of Systems  Constitutional: Positive for malaise/fatigue. Negative for fever.  HENT: Negative for congestion.   Eyes: Negative for blurred vision.  Respiratory: Negative for shortness of breath.   Cardiovascular: Negative for chest pain, palpitations and leg  swelling.  Gastrointestinal: Negative for abdominal pain, blood in stool and nausea.  Genitourinary: Negative for dysuria and frequency.  Musculoskeletal: Positive for myalgias. Negative for falls.  Skin: Negative for rash.  Neurological: Negative for dizziness, loss of consciousness and headaches.  Endo/Heme/Allergies: Negative for environmental allergies.  Psychiatric/Behavioral: Positive for depression. The patient is not nervous/anxious.        Objective:    Physical Exam  Constitutional: She is oriented to person, place, and time. She appears well-developed and well-nourished. No distress.  HENT:  Head: Normocephalic and atraumatic.  Nose: Nose normal.  Eyes: Right eye exhibits no discharge. Left eye exhibits no discharge.  Neck: Normal range of motion. Neck supple.  Cardiovascular: Normal rate and regular rhythm.   No murmur heard. Pulmonary/Chest: Effort normal and breath sounds normal.  Abdominal: Soft. Bowel sounds are normal. There is no tenderness.  Musculoskeletal: She exhibits no edema.  Neurological: She is alert and oriented to person, place, and time.  Skin: Skin is warm and dry.  Psychiatric: She has a normal mood and affect.  Nursing note and vitals reviewed.   There were no vitals taken for this visit. Wt Readings from Last 3 Encounters:  09/02/16 144 lb 9.6 oz (65.6 kg)  08/24/16 146 lb 12.8 oz (66.6 kg)  05/11/16 139 lb (63 kg)     Lab Results  Component Value Date   WBC  6.5 09/02/2016   HGB 12.8 09/02/2016   HCT 39.5 09/02/2016   PLT 261.0 09/02/2016   GLUCOSE 72 09/02/2016   CHOL 215 (H) 09/02/2016   TRIG 59.0 09/02/2016   HDL 78.60 09/02/2016   LDLCALC 125 (H) 09/02/2016   ALT 16 09/02/2016   AST 23 09/02/2016   NA 134 (L) 09/02/2016   K 4.0 09/02/2016   CL 101 09/02/2016   CREATININE 1.20 09/02/2016   BUN 11 09/02/2016   CO2 27 09/02/2016   TSH 0.97 09/02/2016   INR 1.03 02/13/2016   HGBA1C 5.7 09/02/2016    Lab Results  Component Value Date   TSH 0.97 09/02/2016   Lab Results  Component Value Date   WBC 6.5 09/02/2016   HGB 12.8 09/02/2016   HCT 39.5 09/02/2016   MCV 92.4 09/02/2016   PLT 261.0 09/02/2016   Lab Results  Component Value Date   NA 134 (L) 09/02/2016   K 4.0 09/02/2016   CO2 27 09/02/2016   GLUCOSE 72 09/02/2016   BUN 11 09/02/2016   CREATININE 1.20 09/02/2016   BILITOT 0.4 09/02/2016   ALKPHOS 35 (L) 09/02/2016   AST 23 09/02/2016   ALT 16 09/02/2016   PROT 6.8 09/02/2016   ALBUMIN 4.5 09/02/2016   CALCIUM 11.0 (H) 09/02/2016   ANIONGAP 7 02/12/2016   GFR 49.31 (L) 09/02/2016   Lab Results  Component Value Date   CHOL 215 (H) 09/02/2016   Lab Results  Component Value Date   HDL 78.60 09/02/2016   Lab Results  Component Value Date   LDLCALC 125 (H) 09/02/2016   Lab Results  Component Value Date   TRIG 59.0 09/02/2016   Lab Results  Component Value Date   CHOLHDL 3 09/02/2016   Lab Results  Component Value Date   HGBA1C 5.7 09/02/2016       Assessment & Plan:   Problem List Items Addressed This Visit    Depression with anxiety    Struggling with fatigue but overall feels her meds are adequate.       Hyperglycemia  minimize simple carbs. Increase exercise as tolerated.       Vitamin D deficiency    WNL with blood draw take daily supplements.       Osteopenia    Encouraged to get adequate exercise, calcium and vitamin d intake. Will not increase calcium intake due to her  hypercalcemia with recent blood draw. Will repeat cmp in 2-4 weeks.        Other Visit Diagnoses    Hypercalcemia          I am having Ms. Myricks maintain her estradiol, Multiple Vitamin (MULTI VITAMIN DAILY PO), aspirin, fluticasone, albuterol, albuterol, buPROPion, ibuprofen, montelukast, budesonide-formoterol, SUMAtriptan, clonazePAM, fenofibrate, tiZANidine, and traZODone.  No orders of the defined types were placed in this encounter.   Danise Edge, MD

## 2016-09-13 ENCOUNTER — Encounter: Payer: Self-pay | Admitting: Family Medicine

## 2016-09-23 ENCOUNTER — Encounter: Payer: Self-pay | Admitting: Family Medicine

## 2016-09-24 NOTE — Addendum Note (Signed)
Addended by: Crissie Sickles A on: 09/24/2016 01:55 PM   Modules accepted: Orders

## 2016-09-24 NOTE — Telephone Encounter (Signed)
Pt's spouse Gerlene Burdock) called regarding that wife is needing to speak with Nurse about some lab orders urgent. Spouse indicates that wife has been trying to get in contact but has not received a call yet regarding about message below and message that was sent by mychart yesterday 09-23-2016. Please advise ASAP.

## 2016-09-24 NOTE — Telephone Encounter (Signed)
Spoke with patient she is aware of labs being done on next Tuesday.    PC

## 2016-09-24 NOTE — Telephone Encounter (Signed)
Pt tel [phone] or Spouse tel [phone].

## 2016-09-28 ENCOUNTER — Other Ambulatory Visit (INDEPENDENT_AMBULATORY_CARE_PROVIDER_SITE_OTHER): Payer: Medicare Other

## 2016-09-28 LAB — COMPLETE METABOLIC PANEL WITH GFR
AG RATIO: 2 (calc) (ref 1.0–2.5)
ALKALINE PHOSPHATASE (APISO): 40 U/L (ref 33–130)
ALT: 13 U/L (ref 6–29)
AST: 18 U/L (ref 10–35)
Albumin: 4.4 g/dL (ref 3.6–5.1)
BILIRUBIN TOTAL: 0.3 mg/dL (ref 0.2–1.2)
BUN/Creatinine Ratio: 13 (calc) (ref 6–22)
BUN: 14 mg/dL (ref 7–25)
CHLORIDE: 103 mmol/L (ref 98–110)
CO2: 25 mmol/L (ref 20–32)
Calcium: 9.3 mg/dL (ref 8.6–10.4)
Creat: 1.11 mg/dL — ABNORMAL HIGH (ref 0.50–1.05)
GFR, Est African American: 64 mL/min/{1.73_m2} (ref >=60)
GFR, Est Non African American: 55 mL/min/{1.73_m2} — ABNORMAL LOW (ref >=60)
GLUCOSE: 96 mg/dL (ref 65–99)
Globulin: 2.2 g/dL (calc) (ref 1.9–3.7)
POTASSIUM: 4.3 mmol/L (ref 3.5–5.3)
Sodium: 137 mmol/L (ref 135–146)
Total Protein: 6.6 g/dL (ref 6.1–8.1)

## 2016-10-07 ENCOUNTER — Encounter: Payer: Self-pay | Admitting: Medical

## 2016-10-07 ENCOUNTER — Ambulatory Visit (INDEPENDENT_AMBULATORY_CARE_PROVIDER_SITE_OTHER): Payer: Medicare Other | Admitting: Medical

## 2016-10-07 VITALS — BP 126/49 | HR 84 | Temp 99.0°F | Resp 16 | Ht 63.0 in | Wt 144.8 lb

## 2016-10-07 DIAGNOSIS — R1013 Epigastric pain: Secondary | ICD-10-CM

## 2016-10-07 DIAGNOSIS — R109 Unspecified abdominal pain: Secondary | ICD-10-CM

## 2016-10-07 DIAGNOSIS — R195 Other fecal abnormalities: Secondary | ICD-10-CM | POA: Diagnosis not present

## 2016-10-07 MED ORDER — TRAZODONE HCL 100 MG PO TABS: ORAL_TABLET | ORAL | 0 refills | Status: DC

## 2016-10-07 MED ORDER — BUPROPION HCL ER (XL) 150 MG PO TB24: 150.0000 mg | ORAL_TABLET | Freq: Every day | ORAL | 5 refills | Status: DC

## 2016-10-07 MED ORDER — ONDANSETRON 8 MG PO TBDP: 8.0000 mg | ORAL_TABLET | Freq: Three times a day (TID) | ORAL | 0 refills | Status: DC | PRN

## 2016-10-07 MED ORDER — FENOFIBRATE 145 MG PO TABS: 145.0000 mg | ORAL_TABLET | Freq: Every day | ORAL | 0 refills | Status: DC

## 2016-10-07 MED ORDER — SUMATRIPTAN SUCCINATE 100 MG PO TABS
ORAL_TABLET | ORAL | 3 refills | Status: DC
Start: 2016-10-07 — End: 2017-01-11

## 2016-10-07 MED ORDER — RANITIDINE HCL 150 MG PO CAPS: 150.0000 mg | ORAL_CAPSULE | Freq: Two times a day (BID) | ORAL | 0 refills | Status: DC

## 2016-10-07 NOTE — Patient Instructions (Addendum)
For your recent abdomen pain I want you to do a CMP, CBC, amylase, lipase, bacteria breath test,and stool panel studies.(for loose stools).  I want you to eat very bland foods and avoiding fried foods. Will prescribe Zofran for nausea or vomiting. Zantac for abdomen pain.   If any severe excruciating pain as other night then would recommend ED evaluation.   I am going to go ahead and try to refer you back to your former gastroenterologist. You would probably benefit from EGD and possible repeat colonoscopy.   Also due to your reported chronic balance issues, uncontrollable loose stools  and history of ALS , I do think getting follow-up neurology appointment would be beneficial.   Follow-up in 5 days or as needed.   US abdomen ordered and scheduled for 11:30. She was advised fast for 6 hours.

## 2016-10-07 NOTE — Progress Notes (Signed)
Subjective:    Patient ID: Kayla [name], female    DOB: 1960/07/27, 56 y.o.   MRN: [id number]  HPI  Pt in for evaluation.  Pt states went to the ED the other day  and waited 1.5 hours but by time she might have been seen emergency came in and they expected another major delay so they left.Pt had some recurrent episodes of pain. About 2 times in past month severe abdomen pain. But some mild abdomen pain intermittently. Overall intermittent episodes pain for 3-4 months. Pt has no gallbladder? Removed in 2014.  Pt had epigastric pain intermittent but does defintiley get worse after eating. She get some recurrent nausea sensation. Pt does not drink alcohol.    Some episodes of loose stools intermittently since Monday. But then also states this has been going on for months.    Pt has history of Peg tube placed. She had that placed in 2008-2009. She was on a lot of medication. She had neuruologic condition.(Diagnosis of ALS) Was on hospice in the past. She states some balance issues and tremors. Local neurologist did studies and did not have positive findings on EMG. Still some occasional balance issues.  Prior neurologist summary   Previously diagnosed with ALS, but states, she was then told she does not have ALS. Unfortunately, I do not have any prior records from neurology to review. She used to see Dr. Weldon Inches out of [facility], who sent her to the ALS clinic in [place], where she was eventually told she had no ALS. She states her Sx dated back in 2006 with LE weakness, L>R. Sx became worse in 2008 and started having breathing and swallowing problems, had a feeding tube and was even in hospice care, was bed bound for about 18 months, then started improving. She also has neck pain, had neck surgery some 13 years ago and has a pinched nerve. She had a neck MRI under Dr. Enid Baas in [facility]. She goes to [facility], a pain clinic in [facility] and gets neck injections.  She had an  extensive w/u in [place], including blood work, MRI brain, CSF studies, and EMG/NCV testing. Her saw a Land in [place], who left. Prior to that she saw Dr. Alphonzo Dublin at [facility], who told her she did not need to come back to see him. This is per her verbal report. 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  She states, she was told she was not a surgical candidate for her neck. She reports neck pain and weakness in her hands and L foot. She has had worsening weakness in the 2-3 months and has noted muscle wasting and reports fasciculations.  Of note she had a cervical spine MRI in August of this year: Solid fusion at C5-C6 without recurrent stenosis. Unchanged C4-C5 and C6-C7 adjacent segment disease. C4-C5 shows mild to moderate central stenosis and bilateral foraminal stenosis potentially affecting both C5 nerves.   Review of Systems  Constitutional: Negative for chills, fatigue and fever.  HENT: Negative for congestion.   Respiratory: Negative for cough, choking and chest tightness.   Cardiovascular: Negative for chest pain and palpitations.  Gastrointestinal: Positive for abdominal pain, diarrhea and nausea. Negative for blood in stool, constipation and vomiting.       No acute pain presently.  Episodes of complete  bowl incontinence.  Genitourinary: Negative for difficulty urinating, dysuria, flank pain and frequency.  Musculoskeletal: Negative for back pain.  Skin: Negative for rash.  Neurological:  Negative for dizziness, weakness, numbness and headaches.       Balance issues.   Hematological: Negative for adenopathy. Does not bruise/bleed easily.  Psychiatric/Behavioral: Negative for behavioral problems, confusion, self-injury, sleep disturbance and suicidal ideas. The patient is not nervous/anxious.      Past Medical History:  Diagnosis Date  . Allergic symptoms 11/06/2015  . Anxiety   . Asthma 11/06/2015  . Chronic kidney disease    pt stated "I have Stage 3 kidney  disease"  . Complication of anesthesia    Pt stated "sometimes I have trouble waking up"  . Constipation due to pain medication   . DDD (degenerative disc disease), cervical   . Depression with anxiety 07/15/2015  . Functional neurological symptom disorder with mixed symptoms 05/19/2012   2007  Started then was tripping up steps. 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 Left foot drop but has worked hard not to need AFO and is stronger now.     . G tube feedings (HCC)   . Glaucoma   . History of chicken pox 07/15/2015  . Hyperlipidemia, mixed 07/25/2015  . Migraine    Takes Imitrex  . Neck pain 05/12/2016  . Osteoarthritis of back   . Osteopenia 09/02/2016  . Osteoporosis 09/02/2016  . Preventative health care 09/21/2015  . Primary lateral sclerosis (HCC)   . RLL pneumonia (HCC) 07/15/2015  . Shortness of breath dyspnea    with exertion  . Sinusitis 05/12/2016  . Sleep apnea 09/12/2015   H/o Bipap and VPAP use in past  . Vitamin B 12 deficiency 05/11/2016  . Vitamin D deficiency 05/11/2016     Social History   Social History  . Marital status: Married    Spouse name: N/A  . Number of children: N/A  . Years of education: college   Occupational History  . 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 Smoking status: Former Smoker    Packs/day: 0.50    Years: 30.00    Types: Cigarettes    Quit date: 01/05/2016  . Smokeless tobacco: Never Used  . Alcohol use Yes     Comment: occasional  . Drug use: No  . Sexual activity: Yes    Birth control/ protection: Surgical   Other Topics Concern  . Not on file   Social History Narrative   Lives with boyfriend, widowed, then divorced, remarried 2017. No major dietary restrictions. 1/2 ppd, drinks a small amount socially    Past Surgical History:  Procedure Laterality Date  . ABDOMINAL HYSTERECTOMY     TAH b/l SPO, Dr [address]ta [address]  . ANTERIOR CERVICAL DECOMP/DISCECTOMY FUSION N/A  06/19/2015   Procedure: Removal of Codman anterior cervical plate;   Anterior Cervcial Decompression Fusion Cervical Four-Five;  Surgeon: Shirlean Kelly, MD;  Location: [facility] NEURO ORS;  Service: Neurosurgery;  Laterality: N/A;  left side approach  . APPENDECTOMY    . BREAST ENHANCEMENT SURGERY    . CHOLECYSTECTOMY    . COLONOSCOPY    . ECTOPIC PREGNANCY SURGERY     X 4 and miscarriage  . ESOPHAGOGASTRODUODENOSCOPY N/A 05/19/2012   Procedure: ESOPHAGOGASTRODUODENOSCOPY (EGD);  Surgeon: Theda Belfast, MD;  Location: [facility] ENDOSCOPY;  Service: Endoscopy;  Laterality: N/A;  . LEFT HEART CATH AND CORONARY ANGIOGRAPHY N/A 02/13/2016   Procedure: Left Heart Cath and Coronary Angiography;  Surgeon: Kathleene Hazel, MD;  Location: [facility] INVASIVE CV LAB;  Service: Cardiovascular;  Laterality: N/A;  . NECK SURGERY    . PEG PLACEMENT      Family History  Problem Relation Age of Onset  . Hypertension Brother   . Alcohol abuse Brother   . Alcohol abuse Mother   . Cancer Mother        pancreatic  . Heart disease Father   . Alcohol abuse Father   . Heart disease Paternal Uncle   . Alcohol abuse Paternal Uncle     Allergies  Allergen Reactions  . Dilaudid [Hydromorphone Hcl] Itching and Other (See Comments)    Can take with Benadryl  . Hydrocodone-Homatropine Nausea And Vomiting  . Codeine Nausea And Vomiting, Rash and Other (See Comments)    Patient hasn't had it Codeine She doesn't think she has an existing allergy    Current Outpatient Prescriptions on File Prior to Visit  Medication Sig Dispense Refill  . albuterol (PROVENTIL HFA;VENTOLIN HFA) 108 (90 Base) MCG/ACT inhaler Inhale 2 puffs into the lungs every 4 (four) hours as needed for wheezing or shortness of breath. 1 Inhaler 1  . albuterol (PROVENTIL) (2.5 MG/3ML) 0.083% nebulizer solution Take 3 mLs (2.5 mg total) by nebulization every 6 (six) hours as needed for wheezing or shortness of breath. 150 mL 1  . aspirin 81 MG tablet Take  81 mg by mouth daily. Reported on 07/15/2015    . budesonide-formoterol (SYMBICORT) 160-4.5 MCG/ACT inhaler Inhale 2 puffs into the lungs 2 (two) times daily. 1 Inhaler 0  . clonazePAM (KLONOPIN) 0.5 MG tablet Take 1 tablet (0.5 mg total) by mouth 2 (two) times daily as needed for anxiety. 60 tablet 1  . estradiol (ESTRACE) 1 MG tablet Take 1 mg by mouth daily.    . fluticasone (FLONASE) 50 MCG/ACT nasal spray Place 2 sprays into both nostrils 2 (two) times daily.    Marland Kitchen ibuprofen (ADVIL,MOTRIN) 200 MG tablet Take 600 mg by mouth every 6 (six) hours as needed for headache or moderate pain.    . montelukast (SINGULAIR) 10 MG tablet Take 1 tablet (10 mg total) by mouth at bedtime. 30 tablet 0  . Multiple Vitamin (MULTI VITAMIN DAILY PO) Take 1 tablet by mouth daily.     Marland Kitchen tiZANidine (ZANAFLEX) 4 MG tablet Take 1 tablet (4 mg total) by mouth every 6 (six) hours as needed for muscle spasms. 90 tablet 1  . [DISCONTINUED] gabapentin (NEURONTIN) 300 MG capsule Take 1 capsule (300 mg total) by mouth 3 (three) times daily. (Patient not taking: Reported on 06/12/2015) 90 capsule 0   No current facility-administered medications on file prior to visit.     BP (!) 126/49   Pulse 84   Temp 99 F (37.2 C) (Oral)   Resp 16   Ht  (1.6 m)   Wt 144 lb 12.8 oz (65.7 kg)   SpO2 97%   BMI 25.65 kg/m       Objective:   Physical Exam  General Appearance- Not in acute distress.  HEENT Eyes- Scleraeral/Conjuntiva-bilat- Not Yellow. Mouth & Throat- Normal.  Chest and Lung Exam Auscultation: Breath sounds:-Normal. Adventitious sounds:- No Adventitious sounds.  Cardiovascular Auscultation:Rythm - Regular. Heart Sounds -Normal heart sounds.  Abdomen Inspection:-Inspection Normal.  Palpation/Perucssion: Palpation and Percussion of the abdomen reveal- Non Tender, No Rebound tenderness, No rigidity(Guarding) and No Palpable abdominal masses.  Liver:-Normal.  Spleen:- Normal.   Back- no cva  tenderness.      Assessment & Plan:  For your recent abdomen pain I want you to do  a CMP, CBC, amylase, lipase, bacteria breath test,and stool panel studies.(for loose stools).  I want you to eat very bland foods and avoiding fried foods. Will prescribe Zofran for nausea or vomiting. Zantac for abdomen pain.   If any severe excruciating pain as other night then would recommend ED evaluation.   I am going to go ahead and try to refer you back to your former gastroenterologist. You would probably benefit from EGD and possible repeat colonoscopy.   Also due to your reported chronic balance issues, uncontrollable loose stools  and history of ALS , I do think getting follow-up neurology appointment would be beneficial. I do think it would be best to review some of old records before referring to neurologist. Sending old records might be beneficial in next work up.  Follow-up in 5 days or as needed.   US abdomen ordered and scheduled for 11:30. She was advised fast for 6 hours.  45 minutes spent with patient. 50% of time spent on explaining differential diagnosis and approach regarding work and treatment.    Patsey Pitstick, Ramon Dredge, PA-C

## 2016-10-08 ENCOUNTER — Encounter: Payer: Self-pay | Admitting: Medical

## 2016-10-08 ENCOUNTER — Ambulatory Visit (HOSPITAL_BASED_OUTPATIENT_CLINIC_OR_DEPARTMENT_OTHER)
Admission: RE | Admit: 2016-10-08 | Discharge: 2016-10-08 | Disposition: A | Discharge status: Home / Self Care | Payer: Medicare Other | Source: Ambulatory Visit | Attending: Medical | Admitting: Medical

## 2016-10-08 ENCOUNTER — Encounter (HOSPITAL_BASED_OUTPATIENT_CLINIC_OR_DEPARTMENT_OTHER): Payer: Self-pay | Admitting: *Deleted

## 2016-10-08 ENCOUNTER — Emergency Department (HOSPITAL_BASED_OUTPATIENT_CLINIC_OR_DEPARTMENT_OTHER): Payer: Medicare Other

## 2016-10-08 ENCOUNTER — Emergency Department (HOSPITAL_BASED_OUTPATIENT_CLINIC_OR_DEPARTMENT_OTHER)
Admission: EM | Admit: 2016-10-08 | Discharge: 2016-10-08 | Disposition: A | Discharge status: Home / Self Care | Payer: Medicare Other | Attending: Emergency Medicine | Admitting: Emergency Medicine

## 2016-10-08 DIAGNOSIS — R109 Unspecified abdominal pain: Secondary | ICD-10-CM | POA: Diagnosis present

## 2016-10-08 DIAGNOSIS — K7689 Other specified diseases of liver: Secondary | ICD-10-CM | POA: Insufficient documentation

## 2016-10-08 DIAGNOSIS — Z7982 Long term (current) use of aspirin: Secondary | ICD-10-CM | POA: Diagnosis not present

## 2016-10-08 DIAGNOSIS — N183 Chronic kidney disease, stage 3 (moderate): Secondary | ICD-10-CM | POA: Diagnosis not present

## 2016-10-08 DIAGNOSIS — Z9049 Acquired absence of other specified parts of digestive tract: Secondary | ICD-10-CM

## 2016-10-08 DIAGNOSIS — J45909 Unspecified asthma, uncomplicated: Secondary | ICD-10-CM | POA: Insufficient documentation

## 2016-10-08 DIAGNOSIS — R748 Abnormal levels of other serum enzymes: Secondary | ICD-10-CM | POA: Diagnosis not present

## 2016-10-08 DIAGNOSIS — Z87891 Personal history of nicotine dependence: Secondary | ICD-10-CM | POA: Insufficient documentation

## 2016-10-08 DIAGNOSIS — L509 Urticaria, unspecified: Secondary | ICD-10-CM | POA: Insufficient documentation

## 2016-10-08 DIAGNOSIS — R21 Rash and other nonspecific skin eruption: Secondary | ICD-10-CM | POA: Diagnosis not present

## 2016-10-08 DIAGNOSIS — L299 Pruritus, unspecified: Secondary | ICD-10-CM | POA: Insufficient documentation

## 2016-10-08 DIAGNOSIS — Z79899 Other long term (current) drug therapy: Secondary | ICD-10-CM | POA: Insufficient documentation

## 2016-10-08 DIAGNOSIS — R1013 Epigastric pain: Secondary | ICD-10-CM

## 2016-10-08 LAB — CBC WITH DIFFERENTIAL/PLATELET
BASOS ABS: 0.1 10*3/uL (ref 0.0–0.1)
BASOS ABS: 0.1 10*3/uL (ref 0.0–0.1)
Basophils Relative: 1 % (ref 0.0–3.0)
Basophils Relative: 1 %
EOS ABS: 0.3 10*3/uL (ref 0.0–0.7)
EOS ABS: 0.5 10*3/uL (ref 0.0–0.7)
EOS PCT: 4.1 % (ref 0.0–5.0)
EOS PCT: 7 %
HCT: 38.6 % (ref 36.0–46.0)
HEMATOCRIT: 40.7 % (ref 36.0–46.0)
HEMOGLOBIN: 13 g/dL (ref 12.0–15.0)
Hemoglobin: 13.4 g/dL (ref 12.0–15.0)
LYMPHS ABS: 2.6 10*3/uL (ref 0.7–4.0)
LYMPHS PCT: 33 %
Lymphocytes Relative: 24.8 % (ref 12.0–46.0)
Lymphs Abs: 1.9 10*3/uL (ref 0.7–4.0)
MCH: 29.9 pg (ref 26.0–34.0)
MCHC: 32.9 g/dL (ref 30.0–36.0)
MCHC: 33.7 g/dL (ref 30.0–36.0)
MCV: 90 fl (ref 78.0–100.0)
MCV: 88.7 fL (ref 78.0–100.0)
MONOS PCT: 8.3 % (ref 3.0–12.0)
Monocytes Absolute: 0.6 10*3/uL (ref 0.1–1.0)
Monocytes Absolute: 0.8 10*3/uL (ref 0.1–1.0)
Monocytes Relative: 11 %
NEUTROS PCT: 48 %
Neutro Abs: 4.8 10*3/uL (ref 1.4–7.7)
Neutro Abs: 3.9 10*3/uL (ref 1.7–7.7)
Neutrophils Relative %: 61.8 % (ref 43.0–77.0)
PLATELETS: 281 10*3/uL (ref 150.0–400.0)
PLATELETS: 283 10*3/uL (ref 150–400)
RBC: 4.52 Mil/uL (ref 3.87–5.11)
RBC: 4.35 MIL/uL (ref 3.87–5.11)
RDW: 12.6 % (ref 11.5–15.5)
RDW: 11.9 % (ref 11.5–15.5)
WBC: 7.7 10*3/uL (ref 4.0–10.5)
WBC: 7.9 10*3/uL (ref 4.0–10.5)

## 2016-10-08 LAB — URINALYSIS, ROUTINE W REFLEX MICROSCOPIC
BILIRUBIN URINE: NEGATIVE
GLUCOSE, UA: NEGATIVE mg/dL
HGB URINE DIPSTICK: NEGATIVE
Ketones, ur: NEGATIVE mg/dL
Leukocytes, UA: NEGATIVE
Nitrite: NEGATIVE
PH: 7 (ref 5.0–8.0)
Protein, ur: NEGATIVE mg/dL

## 2016-10-08 LAB — COMPREHENSIVE METABOLIC PANEL
ALK PHOS: 41 U/L (ref 38–126)
ALT: 15 U/L (ref 0–35)
ALT: 19 U/L (ref 14–54)
AST: 19 U/L (ref 0–37)
AST: 26 U/L (ref 15–41)
Albumin: 4.9 g/dL (ref 3.5–5.2)
Albumin: 4.6 g/dL (ref 3.5–5.0)
Alkaline Phosphatase: 40 U/L (ref 39–117)
Anion gap: 6 (ref 5–15)
BILIRUBIN TOTAL: 0.3 mg/dL (ref 0.2–1.2)
BUN: 15 mg/dL (ref 6–23)
BUN: 16 mg/dL (ref 6–20)
CALCIUM: 9.7 mg/dL (ref 8.9–10.3)
CHLORIDE: 103 mmol/L (ref 101–111)
CO2: 29 meq/L (ref 19–32)
CO2: 28 mmol/L (ref 22–32)
CREATININE: 1.04 mg/dL — AB (ref 0.44–1.00)
Calcium: 9.9 mg/dL (ref 8.4–10.5)
Chloride: 100 mEq/L (ref 96–112)
Creatinine, Ser: 1.22 mg/dL — ABNORMAL HIGH (ref 0.40–1.20)
GFR calc Af Amer: >60 mL/min (ref >60)
GFR calc non Af Amer: 59 mL/min — ABNORMAL LOW (ref >60)
GFR: 48.36 mL/min — ABNORMAL LOW (ref >60.00)
GLUCOSE: 99 mg/dL (ref 70–99)
GLUCOSE: 97 mg/dL (ref 65–99)
Potassium: 3.8 mEq/L (ref 3.5–5.1)
Potassium: 3.7 mmol/L (ref 3.5–5.1)
SODIUM: 136 meq/L (ref 135–145)
SODIUM: 137 mmol/L (ref 135–145)
Total Bilirubin: 0.2 mg/dL — ABNORMAL LOW (ref 0.3–1.2)
Total Protein: 7.5 g/dL (ref 6.0–8.3)
Total Protein: 7.4 g/dL (ref 6.5–8.1)

## 2016-10-08 LAB — LIPASE, BLOOD: Lipase: 117 U/L — ABNORMAL HIGH (ref 11–51)

## 2016-10-08 LAB — AMYLASE: Amylase: 62 U/L (ref 27–131)

## 2016-10-08 LAB — LIPASE: LIPASE: 29 U/L (ref 11.0–59.0)

## 2016-10-08 MED ORDER — HYDROXYZINE HCL 25 MG PO TABS: 25.0000 mg | ORAL_TABLET | Freq: Four times a day (QID) | ORAL | 0 refills | Status: DC | PRN

## 2016-10-08 MED ORDER — LORAZEPAM 2 MG/ML IJ SOLN
2.0000 mg | Freq: Once | INTRAMUSCULAR | Status: AC
  Administered 2016-10-08: 2 mg via INTRAVENOUS
  Filled 2016-10-08: qty 1

## 2016-10-08 MED ORDER — ONDANSETRON HCL 4 MG/2ML IJ SOLN: 4.0000 mg | Freq: Once | INTRAMUSCULAR | Status: DC

## 2016-10-08 MED ORDER — HYDROCORTISONE 2.5 % EX LOTN: TOPICAL_LOTION | Freq: Two times a day (BID) | CUTANEOUS | 0 refills | Status: DC

## 2016-10-08 MED ORDER — DIPHENHYDRAMINE HCL 50 MG/ML IJ SOLN
50.0000 mg | Freq: Once | INTRAMUSCULAR | Status: AC
  Administered 2016-10-08: 50 mg via INTRAVENOUS
  Filled 2016-10-08: qty 1

## 2016-10-08 MED ORDER — SODIUM CHLORIDE 0.9 % IV BOLUS (SEPSIS)
1000.0000 mL | Freq: Once | INTRAVENOUS | Status: AC
  Administered 2016-10-08: 1000 mL via INTRAVENOUS

## 2016-10-08 MED ORDER — IOPAMIDOL (ISOVUE-300) INJECTION 61%
100.0000 mL | Freq: Once | INTRAVENOUS | Status: AC | PRN
  Administered 2016-10-08: 100 mL via INTRAVENOUS

## 2016-10-08 MED ORDER — PROMETHAZINE HCL 25 MG/ML IJ SOLN
25.0000 mg | Freq: Once | INTRAMUSCULAR | Status: AC
  Administered 2016-10-08: 25 mg via INTRAVENOUS
  Filled 2016-10-08: qty 1

## 2016-10-08 MED ORDER — FENTANYL CITRATE (PF) 100 MCG/2ML IJ SOLN: 50.0000 ug | Freq: Once | INTRAMUSCULAR | Status: DC

## 2016-10-08 NOTE — ED Notes (Signed)
Ativan was given and patient seems to rest.  MD is aware.   No rashes noted at this time except for some scratched marks to BLE.  Pt is concerned about her rashes but I informed her that the rashes is no longer noted and this was witnessed by her husband.

## 2016-10-08 NOTE — Discharge Instructions (Signed)
Continue benadryl 50 mg every 6 hrs or itchiness. Try hydroxyzine if benadryl is not working.   Try hydrocortisone cream to the area that is itchy.   See your doctor in 2-3 days. Follow up with GI doctor   Return to ER if you have worse abdominal pain, vomiting, dehydration, worse rash, fever

## 2016-10-08 NOTE — ED Notes (Signed)
Patient stated that she don't have any rashes, she has bumps.  Explained to pt that those red bumps, we call it rashes.  Red, slightly elevated rashes noted to the BLE, lower abdomen and she stated that it itches.

## 2016-10-08 NOTE — ED Notes (Signed)
ED Provider at bedside. 

## 2016-10-08 NOTE — ED Notes (Signed)
Called to patient's room by husband stating that patient can not get comfortable and has been tossing around the bed.  Went to patient's room and pt c/o unable to get comfortable.  She stated that the itching is a lot better and the nausea is better.  Pt and husband want to talk to MD.  MD notified and spoke with the pt and husband.

## 2016-10-08 NOTE — ED Notes (Signed)
Pt asked if [facility] bed was okay, pt states "yes it fine"

## 2016-10-08 NOTE — ED Provider Notes (Signed)
[facility] Provider Note   CSN: [id number] Arrival date & time: 10/08/16  4098     History   Chief Complaint Chief Complaint  Patient presents with  . Rash    HPI Kayla [name] is a 56 y.o. female history of anxiety, recurrent abdominal pain, here presenting with worsening abdominal pain, itchiness bilateral legs. Patient does have recurrent abdominal pain and had worsening pain since yesterday. Patient was seen in the office yesterday and had normal CBC, CMP, lipase and normal abdominal ultrasound. Patient states that she had worsening epigastric pain and vomiting since yesterday. She also noticed that her bilateral lower extremities are itchy and she has been scratching them. She states that she was started on Tagamet but hasn't taken any. She denies any new food or new shampoos.   The history is provided by the patient.    Past Medical History:  Diagnosis Date  . Allergic symptoms 11/06/2015  . Anxiety   . Asthma 11/06/2015  . Chronic kidney disease    pt stated "I have Stage 3 kidney disease"  . Complication of anesthesia    Pt stated "sometimes I have trouble waking up"  . Constipation due to pain medication   . DDD (degenerative disc disease), cervical   . Depression with anxiety 07/15/2015  . Functional neurological symptom disorder with mixed symptoms 05/19/2012   2007  Started then was tripping up steps. 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 Left foot drop but has worked hard not to need AFO and is stronger now.     . G tube feedings (HCC)   . Glaucoma   . History of chicken pox 07/15/2015  . Hyperlipidemia, mixed 07/25/2015  . Migraine    Takes Imitrex  . Neck pain 05/12/2016  . Osteoarthritis of back   . Osteopenia 09/02/2016  . Osteoporosis 09/02/2016  . Preventative health care 09/21/2015  . Primary lateral sclerosis (HCC)   . RLL pneumonia (HCC) 07/15/2015  . Shortness of breath dyspnea    with exertion  .  Sinusitis 05/12/2016  . Sleep apnea 09/12/2015   H/o Bipap and VPAP use in past  . Vitamin B 12 deficiency 05/11/2016  . Vitamin D deficiency 05/11/2016    Patient Active Problem List   Diagnosis Date Noted  . Diarrhea 09/06/2016  . Osteopenia 09/02/2016  . Shortness of breath 08/24/2016  . Sinusitis 05/12/2016  . Neck pain 05/12/2016  . Vitamin B 12 deficiency 05/11/2016  . Vitamin D deficiency 05/11/2016  . Chest tightness 02/12/2016  . Hypersomnia 01/12/2016  . Cough 01/12/2016  . Exertional dyspnea 01/12/2016  . Asthma 11/06/2015  . Allergic symptoms 11/06/2015  . Urinary tract infectious disease 09/21/2015  . Syncope and collapse 09/21/2015  . Preventative health care 09/21/2015  . Sleep apnea 09/12/2015  . Hyperglycemia 07/25/2015  . Hyperlipidemia, mixed 07/25/2015  . History of chicken pox 07/15/2015  . Depression with anxiety 07/15/2015  . HNP (herniated nucleus pulposus), cervical 06/19/2015  . Left-sided weakness 05/28/2014  . Headache 05/28/2014  . Functional neurological symptom disorder with mixed symptoms 05/19/2012    Past Surgical History:  Procedure Laterality Date  . ABDOMINAL HYSTERECTOMY     TAH b/l SPO, Dr [address]  . ANTERIOR CERVICAL DECOMP/DISCECTOMY FUSION N/A 06/19/2015   Procedure: Removal of Codman anterior cervical plate;   Anterior Cervcial Decompression Fusion Cervical Four-Five;  Surgeon: Shirlean Kelly, MD;  Location: [facility];  Service: Neurosurgery;  Laterality: N/A;  left side approach  . APPENDECTOMY    . BREAST ENHANCEMENT SURGERY    . CHOLECYSTECTOMY    . COLONOSCOPY    . ECTOPIC PREGNANCY SURGERY     X 4 and miscarriage  . ESOPHAGOGASTRODUODENOSCOPY N/A 05/19/2012   Procedure: ESOPHAGOGASTRODUODENOSCOPY (EGD);  Surgeon: Theda Belfast, MD;  Location: [facility];  Service: Endoscopy;  Laterality: N/A;  . LEFT HEART CATH AND CORONARY ANGIOGRAPHY N/A 02/13/2016   Procedure: Left Heart Cath and Coronary Angiography;  Surgeon:  Kathleene Hazel, MD;  Location: [facility] INVASIVE CV LAB;  Service: Cardiovascular;  Laterality: N/A;  . NECK SURGERY    . PEG PLACEMENT      OB History    No data available       Home Medications    Prior to Admission medications   Medication Sig Start Date End Date Taking? Authorizing Provider  albuterol (PROVENTIL HFA;VENTOLIN HFA) 108 (90 Base) MCG/ACT inhaler Inhale 2 puffs into the lungs every 4 (four) hours as needed for wheezing or shortness of breath. 07/08/15   Ward, Layla Maw, DO  albuterol (PROVENTIL) (2.5 MG/3ML) 0.083% nebulizer solution Take 3 mLs (2.5 mg total) by nebulization every 6 (six) hours as needed for wheezing or shortness of breath. 12/01/15   Saguier, Ramon Dredge, PA-C  aspirin 81 MG tablet Take 81 mg by mouth daily. Reported on 07/15/2015    [provider]  budesonide-formoterol (SYMBICORT) 160-4.5 MCG/ACT inhaler Inhale 2 puffs into the lungs 2 (two) times daily. 08/24/16   Veryl Speak, FNP  buPROPion (WELLBUTRIN XL) 150 MG 24 hr tablet Take 1 tablet (150 mg total) by mouth daily. 10/07/16   Saguier, Ramon Dredge, PA-C  clonazePAM (KLONOPIN) 0.5 MG tablet Take 1 tablet (0.5 mg total) by mouth 2 (two) times daily as needed for anxiety. 09/02/16   Bradd Canary, MD  estradiol (ESTRACE) 1 MG tablet Take 1 mg by mouth daily.    [provider]  fenofibrate (TRICOR) 145 MG tablet Take 1 tablet (145 mg total) by mouth daily. 10/07/16   Saguier, Ramon Dredge, PA-C  fluticasone (FLONASE) 50 MCG/ACT nasal spray Place 2 sprays into both nostrils 2 (two) times daily.    [provider]  ibuprofen (ADVIL,MOTRIN) 200 MG tablet Take 600 mg by mouth every 6 (six) hours as needed for headache or moderate pain.    [provider]  montelukast (SINGULAIR) 10 MG tablet Take 1 tablet (10 mg total) by mouth at bedtime. 05/11/16   Bradd Canary, MD  Multiple Vitamin (MULTI VITAMIN DAILY PO) Take 1 tablet by mouth daily.     [provider]  ondansetron  (ZOFRAN ODT) 8 MG disintegrating tablet Take 1 tablet (8 mg total) by mouth every 8 (eight) hours as needed for nausea or vomiting. 10/07/16   Saguier, Ramon Dredge, PA-C  ranitidine (ZANTAC) 150 MG capsule Take 1 capsule (150 mg total) by mouth 2 (two) times daily. Tabs or capsules 10/07/16   Saguier, Ramon Dredge, PA-C  SUMAtriptan (IMITREX) 100 MG tablet 1 tab po at onset and repeat in 2 hours if needed 10/07/16   Saguier, Ramon Dredge, PA-C  tiZANidine (ZANAFLEX) 4 MG tablet Take 1 tablet (4 mg total) by mouth every 6 (six) hours as needed for muscle spasms. 09/02/16   Bradd Canary, MD  traZODone (DESYREL) 100 MG tablet TAKE 1 TO 2 TABLETS(100 TO 200 MG) BY MOUTH AT BEDTIME 10/07/16   Saguier, Ramon Dredge, PA-C    Family History Family History  Problem Relation Age of Onset  .  Hypertension Brother   . Alcohol abuse Brother   . Alcohol abuse Mother   . Cancer Mother        pancreatic  . Heart disease Father   . Alcohol abuse Father   . Heart disease Paternal Uncle   . Alcohol abuse Paternal Uncle     Social History Social History  Substance Use Topics  . Smoking status: Former Smoker    Packs/day: 0.50    Years: 30.00    Types: Cigarettes    Quit date: 01/05/2016  . Smokeless tobacco: Never Used  . Alcohol use Yes     Comment: occasional     Allergies   Dilaudid [hydromorphone hcl]; Hydrocodone-homatropine; and Codeine   Review of Systems Review of Systems  Gastrointestinal: Positive for abdominal pain.  Skin: Positive for rash.  All other systems reviewed and are negative.    Physical Exam Updated Vital Signs BP 116/61 (BP Location: Left Arm)   Pulse 78   Temp 98 F (36.7 C) (Oral)   Resp 18   Ht  (1.6 m)   Wt 65.3 kg (144 lb)   SpO2 100%   BMI 25.51 kg/m   Physical Exam  Constitutional: She is oriented to person, place, and time. She appears well-developed.  Anxious   HENT:  Head: Normocephalic.  MM slightly dry   Eyes: Pupils are equal, round, and reactive to light.  Conjunctivae and EOM are normal.  Neck: Normal range of motion. Neck supple.  Cardiovascular: Normal rate, regular rhythm and normal heart sounds.   Pulmonary/Chest: Effort normal and breath sounds normal. No respiratory distress. She has no wheezes.  Abdominal: Soft. Bowel sounds are normal.  Mild epigastric tenderness   Musculoskeletal: Normal range of motion.  Neurological: She is alert and oriented to person, place, and time. No cranial nerve deficit. Coordination normal.  Skin: Skin is warm.  Scratch marks in lower extremities. ? Some urticaria. No petechiae or purpura, no obvious cellulitis   Nursing note and vitals reviewed.    ED Treatments / Results  Labs (all labs ordered are listed, but only abnormal results are displayed) Labs Reviewed  COMPREHENSIVE METABOLIC PANEL - Abnormal; Notable for the following:       Result Value   Creatinine, Ser 1.04 (*)    Total Bilirubin 0.2 (*)    GFR calc non Af Amer 59 (*)    All other components within normal limits  LIPASE, BLOOD - Abnormal; Notable for the following:    Lipase 117 (*)    All other components within normal limits  URINALYSIS, ROUTINE W REFLEX MICROSCOPIC - Abnormal; Notable for the following:    Specific Gravity, Urine <1.005 (*)    All other components within normal limits  CBC WITH DIFFERENTIAL/PLATELET    EKG  EKG Interpretation None       Radiology US Abdomen Complete  Result Date: 10/08/2016 CLINICAL DATA:  Abdominal pain with nausea EXAM: ABDOMEN ULTRASOUND COMPLETE COMPARISON:  CT abdomen and pelvis March 21, 2013 FINDINGS: Gallbladder: Surgically absent. Common bile duct: Diameter: 8 mm which may be within normal limits for postcholecystectomy state. No biliary duct mass or calculus evident. No intrahepatic biliary duct dilatation evident. Liver: There is a cyst in the lateral segment of the left lobe of the liver measuring 1.8 x 1.9 x 1.8 cm. There is a second smaller cyst in the left lobe of the  liver measuring 0.6 x 0.5 x 0.7 cm. Liver echogenicity overall is increased. Portal vein is  patent on color Doppler imaging with normal direction of blood flow towards the liver. IVC: No abnormality visualized. Pancreas: Visualized portion unremarkable. Portions of pancreas obscured by gas. Spleen: Size and appearance within normal limits. Right Kidney: Length: 10.1 cm. Echogenicity within normal limits. No mass or hydronephrosis visualized. Left Kidney: Length: 10.4 cm. Echogenicity within normal limits. No mass or hydronephrosis visualized. Abdominal aorta: No aneurysm visualized. Other findings: No demonstrable ascites. IMPRESSION: 1.  Gallbladder absent. 2. Cysts noted in left lobe of liver. Liver echogenicity overall is increased, a finding most likely indicative of hepatic steatosis. It should be noted that the sensitivity of ultrasound for detection of noncystic liver lesions is diminished in this circumstance. 3. Portions of pancreas obscured by gas. Visualized portions of pancreas appear normal. 4.  Study otherwise unremarkable. Electronically Signed   By: Bretta Bang III M.D.   On: 10/08/2016 12:10   Ct Abdomen Pelvis W Contrast  Result Date: 10/08/2016 CLINICAL DATA:  Epigastric pain, vomiting EXAM: CT ABDOMEN AND PELVIS WITH CONTRAST TECHNIQUE: Multidetector CT imaging of the abdomen and pelvis was performed using the standard protocol following bolus administration of intravenous contrast. CONTRAST:  ISOVUE-300 IOPAMIDOL (ISOVUE-300) INJECTION 61% COMPARISON:  Ultrasound 10/08/2016, CT 318 1,015 FINDINGS: Lower chest: No acute consolidation or pleural effusion. Partially visualized bilateral breast prostheses. Normal heart size. Hepatobiliary: Stable cysts in the left hepatic lobe. Surgical absence of the gallbladder. No biliary dilatation. Pancreas: Unremarkable. No pancreatic ductal dilatation or surrounding inflammatory changes. Questionable tiny hypodense focus in the body of the  pancreas. Spleen: Normal in size without focal abnormality. Adrenals/Urinary Tract: Adrenal glands are unremarkable. Kidneys are normal, without renal calculi, focal lesion, or hydronephrosis. Bladder is unremarkable. Stomach/Bowel: Stomach is within normal limits. Appendix not well seen but no right lower quadrant inflammatory process. No evidence of bowel wall thickening, distention, or inflammatory changes. 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 Vascular/Lymphatic: Aortic atherosclerosis. No enlarged abdominal or pelvic lymph nodes. Reproductive: Status post hysterectomy. No adnexal masses. Other: Negative for free air or free fluid Musculoskeletal: No acute or significant osseous findings. IMPRESSION: 1. No CT evidence for acute intra-abdominal or pelvic abnormality. 2. Stable hepatic cysts.  Status post cholecystectomy. Electronically Signed   By: Jasmine Pang M.D.   On: 10/08/2016 21:31    Procedures Procedures (including critical care time)  Medications Ordered in ED Medications  ondansetron (ZOFRAN) injection 4 mg (4 mg Intravenous Not Given 10/08/16 2207)  diphenhydrAMINE (BENADRYL) injection 50 mg (50 mg Intravenous Given 10/08/16 2030)  sodium chloride 0.9 % bolus 1,000 mL (0 mLs Intravenous Stopped 10/08/16 2100)  iopamidol (ISOVUE-300) 61 % injection 100 mL (100 mLs Intravenous Contrast Given 10/08/16 2049)  promethazine (PHENERGAN) injection 25 mg (25 mg Intravenous Given 10/08/16 2100)  LORazepam (ATIVAN) injection 2 mg (2 mg Intravenous Given 10/08/16 2201)     Initial Impression / Assessment and Plan / ED Course  I have reviewed the triage vital signs and the nursing notes.  Pertinent labs & imaging results that were available during my care of the patient were reviewed by me and considered in my medical decision making (see chart for details).     Kayla [name] is a 56 y.o. female here with abdominal  pain, vomiting, urticaria. She has been worked up for the abdominal pain outpatient and had normal CBC, CMP, lipase, US abdomen yesterday. Has persistent pain so will get labs, lipase, CT  ab/pel. She it scratching all over but I don't see any petechiae, purpura or cellulitis. I think likely gastro with some urticaria. Will give benadryl and IVF.   10:11 PM Lipase increased to 110. CT ab/pel showed normal pancreas. I think likely gastro with some urticaria. Had side effect from phenergan, given ativan and tremors improved. Will continue benadryl, give hydroxyzine prn. Has GI follow up.    Final Clinical Impressions(s) / ED Diagnoses   Final diagnoses:  None    New Prescriptions New Prescriptions   No medications on file     Charlynne Pander, MD 10/08/16 2212

## 2016-10-08 NOTE — ED Triage Notes (Signed)
Skin has been itching since last night. Rash on her legs.

## 2016-10-08 NOTE — ED Notes (Signed)
Pt asking why a pt that came  In  after her is getting put in a room, explained to pt that we room pts by equity and try to prioritize to get pts seen fast.

## 2016-10-11 LAB — H. PYLORI BREATH TEST: H. PYLORI BREATH TEST: NOT DETECTED

## 2016-10-19 ENCOUNTER — Ambulatory Visit (INDEPENDENT_AMBULATORY_CARE_PROVIDER_SITE_OTHER): Payer: Medicare Other | Admitting: Medical

## 2016-10-19 ENCOUNTER — Encounter: Payer: Self-pay | Admitting: Medical

## 2016-10-19 VITALS — BP 109/62 | HR 79 | Temp 97.7°F | Resp 16 | Ht 63.0 in | Wt 147.6 lb

## 2016-10-19 DIAGNOSIS — T7840XA Allergy, unspecified, initial encounter: Secondary | ICD-10-CM | POA: Diagnosis not present

## 2016-10-19 DIAGNOSIS — R748 Abnormal levels of other serum enzymes: Secondary | ICD-10-CM

## 2016-10-19 DIAGNOSIS — L509 Urticaria, unspecified: Secondary | ICD-10-CM

## 2016-10-19 DIAGNOSIS — R109 Unspecified abdominal pain: Secondary | ICD-10-CM | POA: Diagnosis not present

## 2016-10-19 LAB — LIPASE: LIPASE: 47 U/L (ref 11.0–59.0)

## 2016-10-19 LAB — AMMONIA: AMMONIA: 48 umol/L — AB (ref 11–35)

## 2016-10-19 MED ORDER — METHYLPREDNISOLONE ACETATE 40 MG/ML IJ SUSP
40.0000 mg | Freq: Once | INTRAMUSCULAR | Status: AC
  Administered 2016-10-19: 40 mg via INTRAMUSCULAR

## 2016-10-19 MED ORDER — HYDROXYZINE HCL 25 MG PO TABS: 25.0000 mg | ORAL_TABLET | Freq: Four times a day (QID) | ORAL | 0 refills | Status: DC | PRN

## 2016-10-19 MED ORDER — PREDNISONE 10 MG PO TABS: ORAL_TABLET | ORAL | 0 refills | Status: DC

## 2016-10-19 NOTE — Patient Instructions (Signed)
Your recent rash may represent allergic reaction but cause at this point would be unknown.  Will give Depo-Medrol 40 mg IM injection today and prescribed a 5 day prednisone taper dose. I'm refilling your hydroxyzine. Skin moist and cool. If your rash resolves but then returns with no obvious cause then would consider referral to allergist.(I decided to get ammonia level today).  For your recent abdomen pain and other symptoms described on prior visit you will see your gastroenterologist on Monday. Overall you do appear better and on exam have less discomfort. I am repeating the lipase level which was elevated in the emergency department. Also will get an ammonia level.  Follow-up in 2 weeks or as needed.

## 2016-10-19 NOTE — Progress Notes (Signed)
Subjective:    Patient ID: Kayla [name], female    DOB: March 21, 1960, 56 y.o.   MRN: [id number]  HPI   Pt is itching all over.  2 days after I saw her itching started. Pt went to the emergency department.  The rash is on lower abdomen,buttock area, ankle and forearms. The original area was upper rt thigh.  Pt was given hydroxyzine and this does help. Evaluated in the ED.  On review of potential exposures no new potential exposures. No new soaps, creams, detergents, or insect bites. No new medications.   Pt states itching was worse in ED but now itching  is decreased a lot. Rash will worsen at night.  Comes and goes.  Pt is not diabetic.  Pt has had good response to hydroxyzine.  Also on review her lipase elevated in ED. Some waves of nausea but no vomiting.(pt has appointment with GI on Monday)      Review of Systems  Constitutional: Negative for chills, fatigue and fever.  HENT: Negative for congestion, ear pain, nosebleeds and postnasal drip.   Respiratory: Negative for cough, chest tightness, shortness of breath and wheezing.   Cardiovascular: Negative for chest pain and palpitations.  Gastrointestinal: Negative for abdominal pain, blood in stool, constipation and rectal pain.  Musculoskeletal: Negative for back pain.  Skin: Positive for rash.  Neurological: Negative for dizziness, weakness, light-headedness and numbness.  Hematological: Negative for adenopathy. Does not bruise/bleed easily.  Psychiatric/Behavioral: Negative for behavioral problems, confusion and suicidal ideas. The patient is not nervous/anxious.     Past Medical History:  Diagnosis Date  . Allergic symptoms 11/06/2015  . Anxiety   . Asthma 11/06/2015  . Chronic kidney disease    pt stated "I have Stage 3 kidney disease"  . Complication of anesthesia    Pt stated "sometimes I have trouble waking up"  . Constipation due to pain medication   . DDD (degenerative disc disease), cervical   . Depression  with anxiety 07/15/2015  . Functional neurological symptom disorder with mixed symptoms 05/19/2012   2007  Started then was tripping up steps. 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 Left foot drop but has worked hard not to need AFO and is stronger now.     . G tube feedings (HCC)   . Glaucoma   . History of chicken pox 07/15/2015  . Hyperlipidemia, mixed 07/25/2015  . Migraine    Takes Imitrex  . Neck pain 05/12/2016  . Osteoarthritis of back   . Osteopenia 09/02/2016  . Osteoporosis 09/02/2016  . Preventative health care 09/21/2015  . Primary lateral sclerosis (HCC)   . RLL pneumonia (HCC) 07/15/2015  . Shortness of breath dyspnea    with exertion  . Sinusitis 05/12/2016  . Sleep apnea 09/12/2015   H/o Bipap and VPAP use in past  . Vitamin B 12 deficiency 05/11/2016  . Vitamin D deficiency 05/11/2016     Social History   Social History  . Marital status: Married    Spouse name: N/A  . Number of children: N/A  . Years of education: college   Occupational History  . 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 Smoking status: Former Smoker    Packs/day: 0.50    Years: 30.00    Types: Cigarettes    Quit date: 01/05/2016  . Smokeless tobacco: Never Used  . Alcohol use Yes  Comment: occasional  . Drug use: No  . Sexual activity: Yes    Birth control/ protection: Surgical   Other Topics Concern  . Not on file   Social History Narrative   Lives with boyfriend, widowed, then divorced, remarried 2017. No major dietary restrictions. 1/2 ppd, drinks a small amount socially    Past Surgical History:  Procedure Laterality Date  . ABDOMINAL HYSTERECTOMY     TAH b/l SPO, Dr [address]  . ANTERIOR CERVICAL DECOMP/DISCECTOMY FUSION N/A 06/19/2015   Procedure: Removal of Codman anterior cervical plate;   Anterior Cervcial Decompression Fusion Cervical Four-Five;  Surgeon: Shirlean Kelly, MD;  Location: [facility]UR[facility]S;  Service:  Neurosurgery;  Laterality: N/A;  left side approach  . APPENDECTOMY    . BREAST ENHANCEMENT SURGERY    . CHOLECYSTECTOMY    . COLONOSCOPY    . ECTOPIC PREGNANCY SURGERY     X 4 and miscarriage  . ESOPHAGOGASTRODUODENOSCOPY N/A 05/19/2012   Procedure: ESOPHAGOGASTRODUODENOSCOPY (EGD);  Surgeon: Theda Belfast, MD;  Location: [facility] END[facility]COPY;  Service: Endoscopy;  Laterality: N/A;  . LEFT HEART CATH AND CORONARY ANGIOGRAPHY N/A 02/13/2016   Procedure: Left Heart Cath and Coronary Angiography;  Surgeon: Kathleene Hazel, MD;  [facility]: [facility];  Service: Cardiovascular;  Laterality: N/A;  . NECK SURGERY    . PEG PLACEMENT      Family History  Problem Relation Age of Onset  . Hypertension Brother   . Alcohol abuse Brother   . Alcohol abuse Mother   . Cancer Mother        pancreatic  . Heart disease Father   . Alcohol abuse Father   . Heart disease Paternal Uncle   . Alcohol abuse Paternal Uncle     Allergies  Allergen Reactions  . Dilaudid [Hydromorphone Hcl] Itching and Other (See Comments)    Can take with Benadryl  . Hydrocodone-Homatropine Nausea And Vomiting  . Codeine Nausea And Vomiting, Rash and Other (See Comments)    Patient hasn't had it Codeine She doesn't think she has an existing allergy    Current Outpatient Prescriptions on File Prior to Visit  Medication Sig Dispense Refill  . albuterol (PROVENTIL HFA;VENTOLIN HFA) 108 (90 Base) MCG/ACT inhaler Inhale 2 puffs into the lungs every 4 (four) hours as needed for wheezing or shortness of breath. 1 Inhaler 1  . albuterol (PROVENTIL) (2.5 MG/3ML) 0.083% nebulizer solution Take 3 mLs (2.5 mg total) by nebulization every 6 (six) hours as needed for wheezing or shortness of breath. 150 mL 1  . aspirin 81 MG tablet Take 81 mg by mouth daily. Reported on 07/15/2015    . budesonide-formoterol (SYMBICORT) 160-4.5 MCG/ACT inhaler Inhale 2 puffs into the lungs 2 (two) times daily. 1 Inhaler 0  . buPROPion  (WELLBUTRIN XL) 150 MG 24 hr tablet Take 1 tablet (150 mg total) by mouth daily. 30 tablet 5  . clonazePAM (KLONOPIN) 0.5 MG tablet Take 1 tablet (0.5 mg total) by mouth 2 (two) times daily as needed for anxiety. 60 tablet 1  . estradiol (ESTRACE) 1 MG tablet Take 1 mg by mouth daily.    . fenofibrate (TRICOR) 145 MG tablet Take 1 tablet (145 mg total) by mouth daily. 30 tablet 0  . fluticasone (FLONASE) 50 MCG/ACT nasal spray Place 2 sprays into both nostrils 2 (two) times daily.    . hydrocortisone 2.5 % lotion Apply topically 2 (two) times daily. 59 mL 0  . hydrOXYzine (ATARAX/VISTARIL) 25 MG tablet Take  1 tablet (25 mg total) by mouth every 6 (six) hours as needed. 20 tablet 0  . ibuprofen (ADVIL,MOTRIN) 200 MG tablet Take 600 mg by mouth every 6 (six) hours as needed for headache or moderate pain.    . montelukast (SINGULAIR) 10 MG tablet Take 1 tablet (10 mg total) by mouth at bedtime. 30 tablet 0  . Multiple Vitamin (MULTI VITAMIN DAILY PO) Take 1 tablet by mouth daily.     . ondansetron (ZOFRAN ODT) 8 MG disintegrating tablet Take 1 tablet (8 mg total) by mouth every 8 (eight) hours as needed for nausea or vomiting. 20 tablet 0  . ranitidine (ZANTAC) 150 MG capsule Take 1 capsule (150 mg total) by mouth 2 (two) times daily. Tabs or capsules 60 capsule 0  . SUMAtriptan (IMITREX) 100 MG tablet 1 tab po at onset and repeat in 2 hours if needed 10 tablet 3  . tiZANidine (ZANAFLEX) 4 MG tablet Take 1 tablet (4 mg total) by mouth every 6 (six) hours as needed for muscle spasms. 90 tablet 1  . traZODone (DESYREL) 100 MG tablet TAKE 1 TO 2 TABLETS(100 TO 200 MG) BY MOUTH AT BEDTIME 180 tablet 0  . [DISCONTINUED] gabapentin (NEURONTIN) 300 MG capsule Take 1 capsule (300 mg total) by mouth 3 (three) times daily. (Patient not taking: Reported on 06/12/2015) 90 capsule 0   No current facility-administered medications on file prior to visit.     BP 109/62   Pulse 79   Temp 97.7 F (36.5 C) (Oral)    Resp 16   Ht  (1.6 m)   Wt 147 lb 9.6 oz (67 kg)   SpO2 97%   BMI 26.15 kg/m       Objective:   Physical Exam  General Appearance- Not in acute distress.  HEENT Eyes- Scleraeral/Conjuntiva-bilat- Not Yellow. Mouth & Throat- Normal.  Chest and Lung Exam Auscultation: Breath sounds:-Normal. Adventitious sounds:- No Adventitious sounds.  Cardiovascular Auscultation:Rythm - Regular. Heart Sounds -Normal heart sounds.  Abdomen Inspection:-Inspection Normal.  Palpation/Perucssion: Palpation and Percussion of the abdomen reveal- faint minimal Tender epigastric, No Rebound tenderness, No rigidity(Guarding) and No Palpable abdominal masses.  Liver:-Normal.  Spleen:- Normal.   Derm- scattered rash on abdomen, back and forearms. And some on ankles. Left forearm papular appearance.      Assessment & Plan:  Your recent rash may represent allergic reaction but cause at this point would be unknown.  Will give Depo-Medrol 40 mg IM injection today and prescribed a 5 day prednisone taper dose. I'm refilling your hydroxyzine. Skin moist and cool. If your rash resolves but then returns with no obvious cause then would consider referral to allergist.(I decided to get ammonia level today). Note considering possible neurodermatitis.  For your recent abdomen pain and other symptoms described on prior visit you will see your gastroenterologist on Monday. Overall you do appear better and on exam have less discomfort. I am repeating the lipase level which was elevated in the emergency department. Also will get an ammonia level.  Follow-up in 2 weeks or as needed.  Ayat Drenning, Ramon Dredge, PA-C

## 2016-11-02 ENCOUNTER — Ambulatory Visit: Payer: Medicare Other | Admitting: Medical

## 2016-11-04 ENCOUNTER — Encounter: Payer: Self-pay | Admitting: Medical

## 2016-11-04 ENCOUNTER — Ambulatory Visit (INDEPENDENT_AMBULATORY_CARE_PROVIDER_SITE_OTHER): Payer: Medicare Other | Admitting: Medical

## 2016-11-04 VITALS — HR 82 | Temp 97.7°F | Resp 16 | Ht 63.0 in | Wt 146.0 lb

## 2016-11-04 DIAGNOSIS — R109 Unspecified abdominal pain: Secondary | ICD-10-CM | POA: Diagnosis not present

## 2016-11-04 DIAGNOSIS — K5900 Constipation, unspecified: Secondary | ICD-10-CM | POA: Diagnosis not present

## 2016-11-04 DIAGNOSIS — F418 Other specified anxiety disorders: Secondary | ICD-10-CM | POA: Diagnosis not present

## 2016-11-04 NOTE — Patient Instructions (Addendum)
For your recent abdomen pain and discomfort, continue Nexium or Prilosec.  Nexium is considered sometimes a better of the 2 medications.  The max dose of Nexium over-the-counter is 2 tablets once daily.  Also when you get a a call from GI regarding your most recent study please notify me by my chart so I can look for that result in care everywhere.  For constipation can use MiraLAX or senna daily.  If constipation more than 3 days despite either medication please let us know.  In that event would consider getting abdomen x-ray and considering other medications clear out constipation.  For depression and anxiety continue current medication regimen.  I  do want you to try to establish counseling.  Please call the [facility] and ask for appointment with Terri. If this is not helping my consider adding second medication for depression.  Follow-up in 3-4 weeks or as needed.

## 2016-11-04 NOTE — Progress Notes (Signed)
Subjective:    Patient ID: Kayla [name], female    DOB: [birth year], 56 y.o.   MRN: [id number]  HPI  Pt rash did get a lot better with depomedrol and 5 day taper taper prednisone. Hydroxine helped as well. Improved after 5 days.  Pt had Gi office visit. [facility]. Pt had endoscopy and had inflamed area. Biopsy of inflamed area came back negative. Pt still has some abdomen pain but not as severe as before. Pt told to take nexium or prilosec everyday. Told to use miralax daily. Pt states senna works better.  Second test done yesterday to evaluate constipation and incontinence(anorectal mammotry). I don't see summary of office visit in epic.  Pt has history of depression. Pt is on Wellbutrin XL 1 tab a day and is on clonazepam. Pt describes a lot of trauma in her life even as child and through adulthood. She reports with some meds(ssri) she may have had weight gain.     Review of Systems  Constitutional: Negative for chills, fatigue and fever.  HENT: Negative for ear discharge and ear pain.   Respiratory: Negative for cough, chest tightness, shortness of breath and wheezing.   Cardiovascular: Negative for chest pain and palpitations.  Gastrointestinal: Positive for abdominal pain. Negative for anal bleeding, diarrhea, nausea and vomiting.  Genitourinary: Negative for dysuria and hematuria.  Musculoskeletal: Negative for back pain, joint swelling, myalgias and neck stiffness.  Skin: Negative for rash.  Neurological: Negative for dizziness and headaches.  Hematological: Negative for adenopathy. Does not bruise/bleed easily.  Psychiatric/Behavioral: Positive for dysphoric mood. Negative for agitation, confusion, sleep disturbance and suicidal ideas. The patient is nervous/anxious.    Past Medical History:  Diagnosis Date  . Allergic symptoms 11/06/2015  . Anxiety   . Asthma 11/06/2015  . Chronic kidney disease    pt stated "I have Stage 3 kidney disease"  . Complication  of anesthesia    Pt stated "sometimes I have trouble waking up"  . Constipation due to pain medication   . DDD (degenerative disc disease), cervical   . Depression with anxiety 07/15/2015  . Functional neurological symptom disorder with mixed symptoms 05/19/2012   2007  Started then was tripping up steps. 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 Left foot drop but has worked hard not to need AFO and is stronger now.     . G tube feedings (HCC)   . Glaucoma   . History of chicken pox 07/15/2015  . Hyperlipidemia, mixed 07/25/2015  . Migraine    Takes Imitrex  . Neck pain 05/12/2016  . Osteoarthritis of back   . Osteopenia 09/02/2016  . Osteoporosis 09/02/2016  . Preventative health care 09/21/2015  . Primary lateral sclerosis (HCC)   . RLL pneumonia (HCC) 07/15/2015  . Shortness of breath dyspnea    with exertion  . Sinusitis 05/12/2016  . Sleep apnea 09/12/2015   H/o Bipap and VPAP use in past  . Vitamin B 12 deficiency 05/11/2016  . Vitamin D deficiency 05/11/2016     Social History   Social History  . Marital status: Married    Spouse name: N/A  . Number of children: N/A  . Years of education: college   Occupational History  . 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 Smoking status: Former Smoker    Packs/day: 0.50    Years: 30.00    Types:  Cigarettes    Quit date: 01/05/2016  . Smokeless tobacco: Never Used  . Alcohol use Yes     Comment: occasional  . Drug use: No  . Sexual activity: Yes    Birth control/ protection: Surgical   Other Topics Concern  . Not on file   Social History Narrative   Lives with boyfriend, widowed, then divorced, remarried 2017. No major dietary restrictions. 1/2 ppd, drinks a small amount socially    Past Surgical History:  Procedure Laterality Date  . ABDOMINAL HYSTERECTOMY     TAH b/l SPO, Dr [address]  . ANTERIOR CERVICAL DECOMP/DISCECTOMY FUSION N/A 06/19/2015   Procedure: Removal  of Codman anterior cervical plate;   Anterior Cervcial Decompression Fusion Cervical Four-Five;  Surgeon: Shirlean Kelly, MD;  Location: [facility] NEURO ORS;  Service: Neurosurgery;  Laterality: N/A;  left side approach  . APPENDECTOMY    . BREAST ENHANCEMENT SURGERY    . CHOLECYSTECTOMY    . COLONOSCOPY    . ECTOPIC PREGNANCY SURGERY     X 4 and miscarriage  . ESOPHAGOGASTRODUODENOSCOPY N/A 05/19/2012   Procedure: ESOPHAGOGASTRODUODENOSCOPY (EGD);  Surgeon: Theda Belfast, MD;  Location: [facility] ENDOSCOPY;  Service: Endoscopy;  Laterality: N/A;  . LEFT HEART CATH AND CORONARY ANGIOGRAPHY N/A 02/13/2016   Procedure: Left Heart Cath and Coronary Angiography;  Surgeon: Kathleene Hazel, MD;  Location: [facility] INVASIVE CV LAB;  Service: Cardiovascular;  Laterality: N/A;  . NECK SURGERY    . PEG PLACEMENT      Family History  Problem Relation Age of Onset  . Hypertension Brother   . Alcohol abuse Brother   . Alcohol abuse Mother   . Cancer Mother        pancreatic  . Heart disease Father   . Alcohol abuse Father   . Heart disease Paternal Uncle   . Alcohol abuse Paternal Uncle     Allergies  Allergen Reactions  . Dilaudid [Hydromorphone Hcl] Itching and Other (See Comments)    Can take with Benadryl  . Codeine Nausea And Vomiting, Rash and Other (See Comments)    Patient hasn't had it Codeine She doesn't think she has an existing allergy    Current Outpatient Prescriptions on File Prior to Visit  Medication Sig Dispense Refill  . albuterol (PROVENTIL HFA;VENTOLIN HFA) 108 (90 Base) MCG/ACT inhaler Inhale 2 puffs into the lungs every 4 (four) hours as needed for wheezing or shortness of breath. 1 Inhaler 1  . albuterol (PROVENTIL) (2.5 MG/3ML) 0.083% nebulizer solution Take 3 mLs (2.5 mg total) by nebulization every 6 (six) hours as needed for wheezing or shortness of breath. 150 mL 1  . aspirin 81 MG tablet Take 81 mg by mouth daily. Reported on 07/15/2015    . budesonide-formoterol  (SYMBICORT) 160-4.5 MCG/ACT inhaler Inhale 2 puffs into the lungs 2 (two) times daily. 1 Inhaler 0  . buPROPion (WELLBUTRIN XL) 150 MG 24 hr tablet Take 1 tablet (150 mg total) by mouth daily. 30 tablet 5  . clonazePAM (KLONOPIN) 0.5 MG tablet Take 1 tablet (0.5 mg total) by mouth 2 (two) times daily as needed for anxiety. 60 tablet 1  . estradiol (ESTRACE) 1 MG tablet Take 1 mg by mouth daily.    . fenofibrate (TRICOR) 145 MG tablet Take 1 tablet (145 mg total) by mouth daily. 30 tablet 0  . fluticasone (FLONASE) 50 MCG/ACT nasal spray Place 2 sprays into both nostrils 2 (two) times daily.    . hydrocortisone 2.5 % lotion Apply  topically 2 (two) times daily. 59 mL 0  . hydrOXYzine (ATARAX/VISTARIL) 25 MG tablet Take 1 tablet (25 mg total) by mouth every 6 (six) hours as needed. 40 tablet 0  . ibuprofen (ADVIL,MOTRIN) 200 MG tablet Take 600 mg by mouth every 6 (six) hours as needed for headache or moderate pain.    . montelukast (SINGULAIR) 10 MG tablet Take 1 tablet (10 mg total) by mouth at bedtime. 30 tablet 0  . Multiple Vitamin (MULTI VITAMIN DAILY PO) Take 1 tablet by mouth daily.     . ondansetron (ZOFRAN ODT) 8 MG disintegrating tablet Take 1 tablet (8 mg total) by mouth every 8 (eight) hours as needed for nausea or vomiting. 20 tablet 0  . predniSONE (DELTASONE) 10 MG tablet 5 TAB PO DAY 1 4 TAB PO DAY 2 3 TAB PO DAY 3 2 TAB PO DAY 4 1 TAB PO DAY 5 15 tablet 0  . ranitidine (ZANTAC) 150 MG capsule Take 1 capsule (150 mg total) by mouth 2 (two) times daily. Tabs or capsules 60 capsule 0  . SUMAtriptan (IMITREX) 100 MG tablet 1 tab po at onset and repeat in 2 hours if needed 10 tablet 3  . tiZANidine (ZANAFLEX) 4 MG tablet Take 1 tablet (4 mg total) by mouth every 6 (six) hours as needed for muscle spasms. 90 tablet 1  . traZODone (DESYREL) 100 MG tablet TAKE 1 TO 2 TABLETS(100 TO 200 MG) BY MOUTH AT BEDTIME 180 tablet 0  . [DISCONTINUED] gabapentin (NEURONTIN) 300 MG capsule Take 1  capsule (300 mg total) by mouth 3 (three) times daily. (Patient not taking: Reported on 06/12/2015) 90 capsule 0   No current facility-administered medications on file prior to visit.     Pulse 82   Temp 97.7 F (36.5 C) (Oral)   Resp 16   Ht 5\' 3"  (1.6 m)   Wt 146 lb (66.2 kg)   SpO2 99%   BMI 25.86 kg/m       Objective:   Physical Exam  General Appearance- Not in acute distress.  HEENT Eyes- Scleraeral/Conjuntiva-bilat- Not Yellow. Mouth & Throat- Normal.  Chest and Lung Exam Auscultation: Breath sounds:-Normal. Adventitious sounds:- No Adventitious sounds.  Cardiovascular Auscultation:Rythm - Regular. Heart Sounds -Normal heart sounds.  Abdomen Inspection:-Inspection Normal.  Palpation/Perucssion: Palpation and Percussion of the abdomen reveal- faint epigastric area Tender, No Rebound tenderness, No rigidity(Guarding) and No Palpable abdominal masses.  Liver:-Normal.  Spleen:- Normal.   Neuro- CN III-XII grossly intact.       Assessment & Plan:  For your recent abdomen pain and discomfort, continue Nexium or Prilosec.  Nexium is considered sometimes a better of the 2 medications.  The max dose of Nexium over-the-counter is 2 tablets once daily.  Also when you get a a call from GI regarding your most recent study please notify me by my chart so I can look for that result in care everywhere.  For constipation can use MiraLAX or senna daily.  If constipation more than 3 days despite either medication please let us know.  In that event would consider getting abdomen x-ray and considering other medications clear out  constipation.  For depression and anxiety continue current medication regimen.  I  do want you to try to establish counseling.  Please call the [facility] and ask for appointment with Terri. If this is not helping my consider adding second medication for depression.  Follow-up in 3-4 weeks or as needed.  Philipe Laswell, Ramon Dredge, PA-C

## 2016-12-03 ENCOUNTER — Ambulatory Visit: Payer: Self-pay | Admitting: Family Medicine

## 2016-12-22 ENCOUNTER — Ambulatory Visit: Payer: No Typology Code available for payment source | Admitting: Psychology

## 2016-12-22 DIAGNOSIS — F331 Major depressive disorder, recurrent, moderate: Secondary | ICD-10-CM | POA: Diagnosis not present

## 2017-01-11 ENCOUNTER — Encounter (HOSPITAL_BASED_OUTPATIENT_CLINIC_OR_DEPARTMENT_OTHER): Payer: Self-pay

## 2017-01-11 ENCOUNTER — Ambulatory Visit: Payer: Medicare Other | Admitting: Family Medicine

## 2017-01-11 ENCOUNTER — Ambulatory Visit (HOSPITAL_BASED_OUTPATIENT_CLINIC_OR_DEPARTMENT_OTHER)
Admission: RE | Admit: 2017-01-11 | Discharge: 2017-01-11 | Disposition: A | Discharge status: Home / Self Care | Payer: Medicare Other | Source: Ambulatory Visit | Attending: Family Medicine | Admitting: Family Medicine

## 2017-01-11 ENCOUNTER — Other Ambulatory Visit: Payer: Self-pay | Admitting: Family Medicine

## 2017-01-11 ENCOUNTER — Encounter: Payer: Self-pay | Admitting: Family Medicine

## 2017-01-11 VITALS — BP 116/70 | HR 96 | Temp 97.8°F | Resp 18 | Wt 144.6 lb

## 2017-01-11 DIAGNOSIS — E538 Deficiency of other specified B group vitamins: Secondary | ICD-10-CM

## 2017-01-11 DIAGNOSIS — Z1231 Encounter for screening mammogram for malignant neoplasm of breast: Secondary | ICD-10-CM | POA: Insufficient documentation

## 2017-01-11 DIAGNOSIS — Z0000 Encounter for general adult medical examination without abnormal findings: Secondary | ICD-10-CM | POA: Diagnosis not present

## 2017-01-11 DIAGNOSIS — Z1239 Encounter for other screening for malignant neoplasm of breast: Secondary | ICD-10-CM

## 2017-01-11 DIAGNOSIS — M8580 Other specified disorders of bone density and structure, unspecified site: Secondary | ICD-10-CM | POA: Diagnosis not present

## 2017-01-11 DIAGNOSIS — R739 Hyperglycemia, unspecified: Secondary | ICD-10-CM | POA: Diagnosis not present

## 2017-01-11 DIAGNOSIS — M6289 Other specified disorders of muscle: Secondary | ICD-10-CM | POA: Diagnosis not present

## 2017-01-11 DIAGNOSIS — M542 Cervicalgia: Secondary | ICD-10-CM | POA: Diagnosis not present

## 2017-01-11 DIAGNOSIS — E559 Vitamin D deficiency, unspecified: Secondary | ICD-10-CM

## 2017-01-11 DIAGNOSIS — Z23 Encounter for immunization: Secondary | ICD-10-CM

## 2017-01-11 DIAGNOSIS — E782 Mixed hyperlipidemia: Secondary | ICD-10-CM

## 2017-01-11 LAB — LIPID PANEL
CHOL/HDL RATIO: 3
CHOLESTEROL: 230 mg/dL — AB (ref 0–200)
HDL: 76.5 mg/dL (ref >39.00)
LDL Cholesterol: 134 mg/dL — ABNORMAL HIGH (ref 0–99)
NonHDL: 153.33
TRIGLYCERIDES: 95 mg/dL (ref 0.0–149.0)
VLDL: 19 mg/dL (ref 0.0–40.0)

## 2017-01-11 LAB — CBC
HCT: 43.7 % (ref 36.0–46.0)
HEMOGLOBIN: 14.5 g/dL (ref 12.0–15.0)
MCHC: 33.2 g/dL (ref 30.0–36.0)
MCV: 90.7 fl (ref 78.0–100.0)
PLATELETS: 254 10*3/uL (ref 150.0–400.0)
RBC: 4.82 Mil/uL (ref 3.87–5.11)
RDW: 13.1 % (ref 11.5–15.5)
WBC: 6.8 10*3/uL (ref 4.0–10.5)

## 2017-01-11 LAB — COMPREHENSIVE METABOLIC PANEL
ALBUMIN: 4.9 g/dL (ref 3.5–5.2)
ALK PHOS: 51 U/L (ref 39–117)
ALT: 24 U/L (ref 0–35)
AST: 23 U/L (ref 0–37)
BILIRUBIN TOTAL: 0.4 mg/dL (ref 0.2–1.2)
BUN: 14 mg/dL (ref 6–23)
CALCIUM: 9.7 mg/dL (ref 8.4–10.5)
CO2: 30 mEq/L (ref 19–32)
Chloride: 101 mEq/L (ref 96–112)
Creatinine, Ser: 0.97 mg/dL (ref 0.40–1.20)
GFR: 62.95 mL/min (ref >60.00)
Glucose, Bld: 88 mg/dL (ref 70–99)
Potassium: 4.2 mEq/L (ref 3.5–5.1)
Sodium: 135 mEq/L (ref 135–145)
TOTAL PROTEIN: 7.4 g/dL (ref 6.0–8.3)

## 2017-01-11 LAB — VITAMIN D 25 HYDROXY (VIT D DEFICIENCY, FRACTURES): VITD: 48.59 ng/mL (ref 30.00–100.00)

## 2017-01-11 LAB — VITAMIN B12: VITAMIN B 12: 348 pg/mL (ref 211–911)

## 2017-01-11 LAB — TSH: TSH: 0.84 u[IU]/mL (ref 0.35–4.50)

## 2017-01-11 LAB — HEMOGLOBIN A1C: HEMOGLOBIN A1C: 5.6 % (ref 4.6–6.5)

## 2017-01-11 MED ORDER — FENOFIBRATE 145 MG PO TABS: 145.0000 mg | ORAL_TABLET | Freq: Every day | ORAL | 3 refills | Status: DC

## 2017-01-11 MED ORDER — CLONAZEPAM 0.5 MG PO TABS: 0.5000 mg | ORAL_TABLET | Freq: Two times a day (BID) | ORAL | 5 refills | Status: DC | PRN

## 2017-01-11 MED ORDER — TRAZODONE HCL 100 MG PO TABS: ORAL_TABLET | ORAL | 1 refills | Status: DC

## 2017-01-11 MED ORDER — SUMATRIPTAN SUCCINATE 100 MG PO TABS: ORAL_TABLET | ORAL | 11 refills | Status: DC

## 2017-01-11 MED ORDER — TIZANIDINE HCL 4 MG PO TABS: 4.0000 mg | ORAL_TABLET | Freq: Four times a day (QID) | ORAL | 1 refills | Status: AC | PRN

## 2017-01-11 NOTE — Assessment & Plan Note (Signed)
Maintain supplements monitor levels

## 2017-01-11 NOTE — Assessment & Plan Note (Signed)
Check level maintain supplements

## 2017-01-11 NOTE — Assessment & Plan Note (Signed)
Encouraged heart healthy diet, increase exercise, avoid trans fats, consider a krill oil cap daily 

## 2017-01-11 NOTE — Assessment & Plan Note (Addendum)
Previous history of instrumentation and hardware placement. [facility] and 4 screws and fused 5, 6 and 7, first surgery 2007. Now neck is not healing and patient have daily headaches and daily pain. Now with chronic pain. Will seek a second opinion patient will let us know whom she chooses to see [place]

## 2017-01-11 NOTE — Patient Instructions (Signed)
Pelvic Floor Exercises for Bowel Control  Exercises using both the external anal sphincter and the deep pelvic floor muscles can help you to improve your bowel control. When done correctly, these exercises can tone and strengthen the muscles to help you hold back gas and prevent fecal incontinence (leakage of stool). Exercise programs take time; you may not see any noticeable change in your bowel control immediately.  In some cases it may take several months to regain control.  Bowel Control Muscles The anus and the anal canal, has rings of muscle around it. The outer ring of muscle is called the external anal sphincter; it is a voluntary muscle which you can learn to tighten and close more efficiently. When you contract it you will feel the skin around your anus tighten and pull in as if the anus is winking. Try to keep the buttocks muscles relaxed. The inner ring around the anus is the internal anal sphincter. It is an involuntary and automatic muscle; you don't have to think to keep it closed or open.  This muscle should be closed at all times, except when you are actually trying to have a bowel movement.  In addition to the sphincter muscles, there are deeper muscles called the levator ani that form a sling from your tailbone to your pubic bone. The levator ani muscle has a specific part called the puborectalis that holds stool in until you give the signal to relax and empty.  When you contract these muscles it creates a feeling of lifting the anus inward.  External Anal Sphincter        Levator ani deep layer   Effective Exercises for Control of Gas and Bowels . Identify the specific areas of the pelvic floor muscles you need to use.  This can be done using a mirror to see if you are contracting the correct muscles or by placing the pad of your finger at or just inside the anal opening. . Develop an exercise plan for strength, endurance and quick response of the muscles and stick with it.  You  must make the muscles do more than they are used to doing. . Incorporate the exercises into your daily activities.   2007, [employer] Doc.36  

## 2017-01-11 NOTE — Assessment & Plan Note (Signed)
hgba1c acceptable, minimize simple carbs. Increase exercise as tolerated.  

## 2017-01-11 NOTE — Assessment & Plan Note (Signed)
Encouraged to get adequate exercise, calcium and vitamin d intake 

## 2017-01-13 DIAGNOSIS — M6289 Other specified disorders of muscle: Secondary | ICD-10-CM | POA: Insufficient documentation

## 2017-01-13 NOTE — Progress Notes (Signed)
Patient ID: Kayla [name], female   DOB: 06/28/1960, 57 y.o.   MRN: [id number]   Subjective:    Patient ID: Kayla [name], female    DOB: [birth year], 57 y.o.   MRN: [id number]  No chief complaint on file.   Kayla [name] is a 57 y.o. who presents for Medicare Annual preventative exam and follow up on chronic medical concerns. She feels mostly at her baseline today but does have ongoing back pain and incotinence issues. No recent febrile illnessor hospitalizations. Denies CP/palp/SOB/HA/congestion/fevers/GI or GU c/o. Taking meds as prescribed  Patient Care Team: Bradd Canary, MD as PCP - General (Family Medicine)   Past Medical History:  Diagnosis Date  . Allergic symptoms 11/06/2015  . Anxiety   . Asthma 11/06/2015  . Chronic kidney disease    pt stated "I have Stage 3 kidney disease"  . Complication of anesthesia    Pt stated "sometimes I have trouble waking up"  . Constipation due to pain medication   . DDD (degenerative disc disease), cervical   . Depression with anxiety 07/15/2015  . Functional neurological symptom disorder with mixed symptoms 05/19/2012   2007  Started then was tripping up steps. 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 Left foot drop but has worked hard not to need AFO and is stronger now.     . G tube feedings (HCC)   . Glaucoma   . History of chicken pox 07/15/2015  . Hyperlipidemia, mixed 07/25/2015  . Migraine    Takes Imitrex  . Neck pain 05/12/2016  . Osteoarthritis of back   . Osteopenia 09/02/2016  . Osteoporosis 09/02/2016  . Preventative health care 09/21/2015  . Primary lateral sclerosis (HCC)   . RLL pneumonia (HCC) 07/15/2015  . Shortness of breath dyspnea    with exertion  . Sinusitis 05/12/2016  . Sleep apnea 09/12/2015   H/o Bipap and VPAP use in past  . Vitamin B 12 deficiency 05/11/2016  . Vitamin D deficiency 05/11/2016    Past Surgical History:  Procedure Laterality Date  . ABDOMINAL HYSTERECTOMY     TAH b/l  SPO, Dr Loreta Ave  . ANTERIOR CERVICAL DECOMP/DISCECTOMY FUSION N/A 06/19/2015   Procedure: Removal of Codman anterior cervical plate;   Anterior Cervcial Decompression Fusion Cervical Four-Five;  Surgeon: Shirlean Kelly, MD;  Location: [facility];  Service: Neurosurgery;  Laterality: N/A;  left side approach  . APPENDECTOMY    . AUGMENTATION MAMMAPLASTY    . BREAST ENHANCEMENT SURGERY    . CHOLECYSTECTOMY    . COLONOSCOPY    . ECTOPIC PREGNANCY SURGERY     X 4 and miscarriage  . ESOPHAGOGASTRODUODENOSCOPY N/A 05/19/2012   Procedure: ESOPHAGOGASTRODUODENOSCOPY (EGD);  Surgeon: Theda Belfast, MD;  Location: [facility];  Service: Endoscopy;  Laterality: N/A;  . LEFT HEART CATH AND CORONARY ANGIOGRAPHY N/A 02/13/2016   Procedure: Left Heart Cath and Coronary Angiography;  Surgeon: Kathleene Hazel, MD;  Location: [facility] INVASIVE CV LAB;  Service: Cardiovascular;  Laterality: N/A;  . NECK SURGERY    . PEG PLACEMENT      Family History  Problem Relation Age of Onset  . Hypertension Brother   . Alcohol abuse Brother   . Alcohol abuse Mother   . Cancer Mother        pancreatic  . Heart disease Father   . Alcohol abuse Father   . Heart disease Paternal Uncle   . Alcohol abuse Paternal Uncle  Social History   Socioeconomic History  . Marital status: Married    Spouse name: Not on file  . Number of children: Not on file  . Years of education: college  . Highest education level: Not on file  Social Needs  . Financial resource strain: Not on file  . Food insecurity - worry: Not on file  . Food insecurity - inability: Not on file  . Transportation needs - medical: Not on file  . Transportation needs - non-medical: Not on file  Occupational History  . 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 Smoking status: Former Smoker    Packs/day: 0.50    Years: 30.00    Pack years: 15.00    Types: Cigarettes    Last attempt to quit: 01/05/2016    Years since quitting:  1.0  . Smokeless tobacco: Never Used  Substance and Sexual Activity  . Alcohol use: Yes    Comment: occasional  . Drug use: No  . Sexual activity: Yes    Birth control/protection: Surgical  Other Topics Concern  . Not on file  Social History Narrative   Lives with boyfriend, widowed, then divorced, remarried 2017. No major dietary restrictions. 1/2 ppd, drinks a small amount socially    Outpatient Medications Prior to Visit  Medication Sig Dispense Refill  . albuterol (PROVENTIL HFA;VENTOLIN HFA) 108 (90 Base) MCG/ACT inhaler Inhale 2 puffs into the lungs every 4 (four) hours as needed for wheezing or shortness of breath. 1 Inhaler 1  . albuterol (PROVENTIL) (2.5 MG/3ML) 0.083% nebulizer solution Take 3 mLs (2.5 mg total) by nebulization every 6 (six) hours as needed for wheezing or shortness of breath. 150 mL 1  . aspirin 81 MG tablet Take 81 mg by mouth daily. Reported on 07/15/2015    . budesonide-formoterol (SYMBICORT) 160-4.5 MCG/ACT inhaler Inhale 2 puffs into the lungs 2 (two) times daily. 1 Inhaler 0  . buPROPion (WELLBUTRIN XL) 150 MG 24 hr tablet Take 1 tablet (150 mg total) by mouth daily. 30 tablet 5  . estradiol (ESTRACE) 1 MG tablet Take 1 mg by mouth daily.    . fluticasone (FLONASE) 50 MCG/ACT nasal spray Place 2 sprays into both nostrils 2 (two) times daily.    . hydrocortisone 2.5 % lotion Apply topically 2 (two) times daily. 59 mL 0  . hydrOXYzine (ATARAX/VISTARIL) 25 MG tablet Take 1 tablet (25 mg total) by mouth every 6 (six) hours as needed. 40 tablet 0  . ibuprofen (ADVIL,MOTRIN) 200 MG tablet Take 600 mg by mouth every 6 (six) hours as needed for headache or moderate pain.    . montelukast (SINGULAIR) 10 MG tablet Take 1 tablet (10 mg total) by mouth at bedtime. 30 tablet 0  . Multiple Vitamin (MULTI VITAMIN DAILY PO) Take 1 tablet by mouth daily.     . ondansetron (ZOFRAN ODT) 8 MG disintegrating tablet Take 1 tablet (8 mg total) by mouth every 8 (eight) hours as  needed for nausea or vomiting. 20 tablet 0  . ranitidine (ZANTAC) 150 MG capsule Take 1 capsule (150 mg total) by mouth 2 (two) times daily. Tabs or capsules 60 capsule 0  . vitamin E 100 UNIT capsule Take by mouth.    . clonazePAM (KLONOPIN) 0.5 MG tablet Take 1 tablet (0.5 mg total) by mouth 2 (two) times daily as needed for anxiety. 60 tablet 1  . fenofibrate (TRICOR) 145 MG tablet Take 1 tablet (145 mg total) by mouth daily. 30  tablet 0  . predniSONE (DELTASONE) 10 MG tablet 5 TAB PO DAY 1 4 TAB PO DAY 2 3 TAB PO DAY 3 2 TAB PO DAY 4 1 TAB PO DAY 5 15 tablet 0  . SUMAtriptan (IMITREX) 100 MG tablet 1 tab po at onset and repeat in 2 hours if needed 10 tablet 3  . tiZANidine (ZANAFLEX) 4 MG tablet Take 1 tablet (4 mg total) by mouth every 6 (six) hours as needed for muscle spasms. 90 tablet 1  . traZODone (DESYREL) 100 MG tablet TAKE 1 TO 2 TABLETS(100 TO 200 MG) BY MOUTH AT BEDTIME 180 tablet 0   No facility-administered medications prior to visit.     Allergies  Allergen Reactions  . Dilaudid [Hydromorphone Hcl] Itching and Other (See Comments)    Can take with Benadryl  . Hydromorphone Hcl Itching and Other (See Comments)    Can take with Benadryl Can take with Benadryl  . Codeine Nausea And Vomiting, Rash and Other (See Comments)    Patient hasn't had it Codeine She doesn't think she has an existing allergy Patient hasn't had it Codeine She doesn't think she has an existing allergy Patient hasn't had it so she doesn't think it's an existing allergy    ROS     Objective:    Physical Exam  BP 116/70 (BP Location: Left Arm, Patient Position: Sitting, Cuff Size: Normal)   Pulse 96   Temp 97.8 F (36.6 C) (Oral)   Resp 18   Wt 144 lb 9.6 oz (65.6 kg)   SpO2 97%   BMI 25.61 kg/m   Wt Readings from Last 3 Encounters:  01/11/17 144 lb 9.6 oz (65.6 kg)  11/04/16 146 lb (66.2 kg)  10/19/16 147 lb 9.6 oz (67 kg)    BP Readings from Last 3 Encounters:  01/11/17  116/70  10/19/16 109/62  10/08/16 122/70     Immunization History  Administered Date(s) Administered  . Influenza,inj,Quad PF,6+ Mos 09/12/2015, 01/11/2017  . Tdap 07/08/2014  . Zoster Recombinat (Shingrix) 01/11/2017    Health Maintenance  Topic Date Due  . Hepatitis C Screening  1960/10/14  . HIV Screening  03/19/1975  . PAP SMEAR  01/11/2019  . MAMMOGRAM  01/12/2019  . COLONOSCOPY  05/05/2023  . TETANUS/TDAP  07/07/2024  . INFLUENZA VACCINE  Completed         In your present state of health, do you have any difficulty performing the following activities: 02/13/2016  Hearing? N  Vision? N  Difficulty concentrating or making decisions? N  Walking or climbing stairs? N  Dressing or bathing? N  Doing errands, shopping? N  Some recent data might be hidden    Fall Risk  10/16/2015  Falls in the past year? Yes  Number falls in past yr: 2 or more  Injury with Fall? Yes  Risk for fall due to : Impaired balance/gait  Follow up Falls prevention discussed    No flowsheet data found.  No flowsheet data found.     No exam data present     Goals    None       Assessment & Plan:    During the course of the visit the patient was educated and counseled about the following appropriate screening and preventive services:   Vaccines to include Pneumoccal, Influenza, Hepatitis B, Td, Zostavax, HCV  Electrocardiogram  Cardiovascular Disease  Colorectal cancer screening  Diabetes screening  Prostate Cancer Screening  Glaucoma screening  Nutrition counseling   Smoking  cessation counseling  Patient Instructions (the written plan) was given to the patient.  Danise Edge, MD 01/13/17

## 2017-01-13 NOTE — Assessment & Plan Note (Signed)
Is having trouble with bowel and bladder incontinence and lack of sensation in that area and thus loss of control. [facility]

## 2017-01-18 ENCOUNTER — Telehealth: Payer: Self-pay | Admitting: *Deleted

## 2017-01-18 NOTE — Telephone Encounter (Signed)
Received Medical records from [facility]; forwarded to provider/SLS 01/15   

## 2017-01-18 NOTE — Telephone Encounter (Signed)
[facility]; forwarded to provider/SLS 01/15

## 2017-01-18 NOTE — Telephone Encounter (Signed)
Received Medical records from [facility]; forwarded to provider/SLS 01/15

## 2017-01-25 ENCOUNTER — Ambulatory Visit: Payer: Medicare Other | Admitting: Psychology

## 2017-01-25 DIAGNOSIS — F331 Major depressive disorder, recurrent, moderate: Secondary | ICD-10-CM | POA: Diagnosis not present

## 2017-02-06 ENCOUNTER — Other Ambulatory Visit: Payer: Self-pay | Admitting: Medical

## 2017-03-08 ENCOUNTER — Ambulatory Visit: Payer: Medicare Other | Admitting: Psychology

## 2017-04-13 ENCOUNTER — Other Ambulatory Visit: Payer: Self-pay | Admitting: Medical

## 2017-04-19 ENCOUNTER — Ambulatory Visit: Payer: Medicare Other | Admitting: Family Medicine

## 2017-04-25 ENCOUNTER — Other Ambulatory Visit: Payer: Self-pay | Admitting: Neurosurgery

## 2017-04-25 DIAGNOSIS — S129XXA Fracture of neck, unspecified, initial encounter: Secondary | ICD-10-CM

## 2017-05-12 ENCOUNTER — Encounter: Payer: Self-pay | Admitting: Family Medicine

## 2017-05-12 ENCOUNTER — Ambulatory Visit: Payer: Medicare Other | Admitting: Family Medicine

## 2017-05-12 VITALS — BP 123/70 | HR 90 | Temp 98.0°F | Resp 16 | Ht 62.99 in | Wt 143.6 lb

## 2017-05-12 DIAGNOSIS — F419 Anxiety disorder, unspecified: Secondary | ICD-10-CM

## 2017-05-12 DIAGNOSIS — R51 Headache: Secondary | ICD-10-CM | POA: Diagnosis not present

## 2017-05-12 DIAGNOSIS — E538 Deficiency of other specified B group vitamins: Secondary | ICD-10-CM | POA: Diagnosis not present

## 2017-05-12 DIAGNOSIS — J329 Chronic sinusitis, unspecified: Secondary | ICD-10-CM | POA: Diagnosis not present

## 2017-05-12 DIAGNOSIS — R739 Hyperglycemia, unspecified: Secondary | ICD-10-CM

## 2017-05-12 DIAGNOSIS — F447 Conversion disorder with mixed symptom presentation: Secondary | ICD-10-CM | POA: Diagnosis not present

## 2017-05-12 DIAGNOSIS — E559 Vitamin D deficiency, unspecified: Secondary | ICD-10-CM

## 2017-05-12 DIAGNOSIS — T7840XS Allergy, unspecified, sequela: Secondary | ICD-10-CM | POA: Diagnosis not present

## 2017-05-12 DIAGNOSIS — Z79899 Other long term (current) drug therapy: Secondary | ICD-10-CM | POA: Diagnosis not present

## 2017-05-12 DIAGNOSIS — R519 Headache, unspecified: Secondary | ICD-10-CM

## 2017-05-12 DIAGNOSIS — E782 Mixed hyperlipidemia: Secondary | ICD-10-CM | POA: Diagnosis not present

## 2017-05-12 DIAGNOSIS — J4520 Mild intermittent asthma, uncomplicated: Secondary | ICD-10-CM

## 2017-05-12 DIAGNOSIS — Z9109 Other allergy status, other than to drugs and biological substances: Secondary | ICD-10-CM | POA: Diagnosis not present

## 2017-05-12 MED ORDER — MONTELUKAST SODIUM 10 MG PO TABS: 10.0000 mg | ORAL_TABLET | Freq: Every day | ORAL | 5 refills | Status: DC

## 2017-05-12 MED ORDER — METHYLPREDNISOLONE 4 MG PO TABS: ORAL_TABLET | ORAL | 0 refills | Status: DC

## 2017-05-12 MED ORDER — METHYLPREDNISOLONE ACETATE 40 MG/ML IJ SUSP
40.0000 mg | Freq: Once | INTRAMUSCULAR | Status: AC
  Administered 2017-05-12: 40 mg via INTRAMUSCULAR

## 2017-05-12 MED ORDER — METHYLPREDNISOLONE ACETATE 40 MG/ML IJ SUSP: 40.0000 mg | Freq: Once | INTRAMUSCULAR | 0 refills | Status: AC

## 2017-05-12 MED ORDER — CEFDINIR 300 MG PO CAPS: 300.0000 mg | ORAL_CAPSULE | Freq: Two times a day (BID) | ORAL | 0 refills | Status: AC

## 2017-05-12 MED ORDER — TOPIRAMATE 50 MG PO TABS: 50.0000 mg | ORAL_TABLET | Freq: Two times a day (BID) | ORAL | 5 refills | Status: DC

## 2017-05-12 NOTE — Assessment & Plan Note (Signed)
Check labs today.

## 2017-05-12 NOTE — Assessment & Plan Note (Signed)
hgba1c acceptable, minimize simple carbs. Increase exercise as tolerated.  

## 2017-05-12 NOTE — Assessment & Plan Note (Signed)
Encouraged heart healthy diet, increase exercise, avoid trans fats, consider a krill oil cap daily 

## 2017-05-12 NOTE — Patient Instructions (Signed)

## 2017-05-12 NOTE — Assessment & Plan Note (Signed)
Labs ordered today

## 2017-05-12 NOTE — Assessment & Plan Note (Signed)
Has had recent falls one broke his left arm. Is following with Dr Amanda Pea after surgery. Healing well

## 2017-05-12 NOTE — Assessment & Plan Note (Signed)
Albuterol prn. Doing well

## 2017-05-12 NOTE — Assessment & Plan Note (Signed)
Bad since [place] been going [place] and [place] to the beach, given a shot

## 2017-05-12 NOTE — Progress Notes (Signed)
Subjective:  I acted as a Neurosurgeon for [employer] Fuller Song, RMA   Patient ID: Kayla [name], female    DOB: 10/01/1960, 57 y.o.   MRN: [id number]  Chief Complaint  Patient presents with  . Follow-up    HPI  Patient is in today for follow up visit. She has been struggling with headaches and head congestion. No obvious fevers or chills but is noting a cough. She has been using some Sudafed which helps temporarily but then symptoms return. Denies CP/palp/SOB/fevers/GI or GU c/o. Taking meds as prescribed  Patient Care Team: Bradd Canary, MD as PCP - General (Family Medicine)   Past Medical History:  Diagnosis Date  . Allergic symptoms 11/06/2015  . Anxiety   . Asthma 11/06/2015  . Chronic kidney disease    pt stated "I have Stage 3 kidney disease"  . Complication of anesthesia    Pt stated "sometimes I have trouble waking up"  . Constipation due to pain medication   . DDD (degenerative disc disease), cervical   . Depression with anxiety 07/15/2015  . Functional neurological symptom disorder with mixed symptoms 05/19/2012   2007  Started then was tripping up steps. 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 Left foot drop but has worked hard not to need AFO and is stronger now.     . G tube feedings (HCC)   . Glaucoma   . History of chicken pox 07/15/2015  . Hyperlipidemia, mixed 07/25/2015  . Migraine    Takes Imitrex  . Neck pain 05/12/2016  . Osteoarthritis of back   . Osteopenia 09/02/2016  . Osteoporosis 09/02/2016  . Preventative health care 09/21/2015  . Primary lateral sclerosis (HCC)   . RLL pneumonia (HCC) 07/15/2015  . Shortness of breath dyspnea    with exertion  . Sinusitis 05/12/2016  . Sleep apnea 09/12/2015   H/o Bipap and VPAP use in past  . Vitamin B 12 deficiency 05/11/2016  . Vitamin D deficiency 05/11/2016    Past Surgical History:  Procedure Laterality Date  . ABDOMINAL HYSTERECTOMY     TAH b/l SPO, Dr Loreta Ave  . ANTERIOR  CERVICAL DECOMP/DISCECTOMY FUSION N/A 06/19/2015   Procedure: Removal of Codman anterior cervical plate;   Anterior Cervcial Decompression Fusion Cervical Four-Five;  Surgeon: Shirlean Kelly, MD;  Location: [facility];  Service: Neurosurgery;  Laterality: N/A;  left side approach  . APPENDECTOMY    . AUGMENTATION MAMMAPLASTY    . BREAST ENHANCEMENT SURGERY    . CHOLECYSTECTOMY    . COLONOSCOPY    . ECTOPIC PREGNANCY SURGERY     X 4 and miscarriage  . ESOPHAGOGASTRODUODENOSCOPY N/A 05/19/2012   Procedure: ESOPHAGOGASTRODUODENOSCOPY (EGD);  Surgeon: Theda Belfast, MD;  Location: [facility] ENDOSCOPY;  Service: Endoscopy;  Laterality: N/A;  . LEFT HEART CATH AND CORONARY ANGIOGRAPHY N/A 02/13/2016   Procedure: Left Heart Cath and Coronary Angiography;  Surgeon: Kathleene Hazel, MD;  Location: [facility] INVASIVE CV LAB;  Service: Cardiovascular;  Laterality: N/A;  . NECK SURGERY    . PEG PLACEMENT      Family History  Problem Relation Age of Onset  . Hypertension Brother   . Alcohol abuse Brother   . Alcohol abuse Mother   . Cancer Mother        pancreatic  . Heart disease Father   . Alcohol abuse Father   . Heart disease Paternal Uncle   . Alcohol abuse Paternal Uncle  Social History   Socioeconomic History  . Marital status: Married    Spouse name: Not on file  . Number of children: Not on file  . Years of education: college  . Highest education level: Not on file  Occupational History  . Occupation: Unable to work as a Radiographer, therapeutic  . Financial resource strain: Not on file  . Food insecurity:    Worry: Not on file    Inability: Not on file  . Transportation needs:    Medical: Not on file    Non-medical: Not on file  Tobacco Use  . Smoking status: Former Smoker    Packs/day: 0.50    Years: 30.00    Pack years: 15.00    Types: Cigarettes    Last attempt to quit: 01/05/2016    Years since quitting: 1.3  . Smokeless tobacco: Never Used  Substance and Sexual  Activity  . Alcohol use: Yes    Comment: occasional  . Drug use: No  . Sexual activity: Yes    Birth control/protection: Surgical  Lifestyle  . Physical activity:    Days per week: Not on file    Minutes per session: Not on file  . Stress: Not on file  Relationships  . Social connections:    Talks on phone: Not on file    Gets together: Not on file    Attends religious service: Not on file    Active member of club or organization: Not on file    Attends meetings of clubs or organizations: Not on file    Relationship status: Not on file  . Intimate partner violence:    Fear of current or ex partner: Not on file    Emotionally abused: Not on file    Physically abused: Not on file    Forced sexual activity: Not on file  Other Topics Concern  . Not on file  Social History Narrative   Lives with boyfriend, widowed, then divorced, remarried 2017. No major dietary restrictions. 1/2 ppd, drinks a small amount socially    Outpatient Medications Prior to Visit  Medication Sig Dispense Refill  . albuterol (PROVENTIL HFA;VENTOLIN HFA) 108 (90 Base) MCG/ACT inhaler Inhale 2 puffs into the lungs every 4 (four) hours as needed for wheezing or shortness of breath. 1 Inhaler 1  . albuterol (PROVENTIL) (2.5 MG/3ML) 0.083% nebulizer solution Take 3 mLs (2.5 mg total) by nebulization every 6 (six) hours as needed for wheezing or shortness of breath. 150 mL 1  . aspirin 81 MG tablet Take 81 mg by mouth daily. Reported on 07/15/2015    . budesonide-formoterol (SYMBICORT) 160-4.5 MCG/ACT inhaler Inhale 2 puffs into the lungs 2 (two) times daily. 1 Inhaler 0  . buPROPion (WELLBUTRIN XL) 150 MG 24 hr tablet TAKE 1 TABLET(150 MG) BY MOUTH DAILY 90 tablet 0  . clonazePAM (KLONOPIN) 0.5 MG tablet Take 1 tablet (0.5 mg total) by mouth 2 (two) times daily as needed for anxiety. 60 tablet 5  . estradiol (ESTRACE) 1 MG tablet Take 1 mg by mouth daily.    . fenofibrate (TRICOR) 145 MG tablet Take 1 tablet (145  mg total) by mouth daily. 90 tablet 3  . fluticasone (FLONASE) 50 MCG/ACT nasal spray Place 2 sprays into both nostrils 2 (two) times daily.    . hydrocortisone 2.5 % lotion Apply topically 2 (two) times daily. 59 mL 0  . hydrOXYzine (ATARAX/VISTARIL) 25 MG tablet Take 1 tablet (25 mg total) by mouth every 6 (  six) hours as needed. 40 tablet 0  . ibuprofen (ADVIL,MOTRIN) 200 MG tablet Take 600 mg by mouth every 6 (six) hours as needed for headache or moderate pain.    . Multiple Vitamin (MULTI VITAMIN DAILY PO) Take 1 tablet by mouth daily.     . ondansetron (ZOFRAN ODT) 8 MG disintegrating tablet Take 1 tablet (8 mg total) by mouth every 8 (eight) hours as needed for nausea or vomiting. 20 tablet 0  . ranitidine (ZANTAC) 150 MG capsule Take 1 capsule (150 mg total) by mouth 2 (two) times daily. Tabs or capsules 60 capsule 0  . SUMAtriptan (IMITREX) 100 MG tablet 1 tab po at onset and repeat in 2 hours if needed 15 tablet 11  . tiZANidine (ZANAFLEX) 4 MG tablet Take 1 tablet (4 mg total) by mouth every 6 (six) hours as needed for muscle spasms. 90 tablet 1  . traZODone (DESYREL) 100 MG tablet TAKE 1 TO 2 TABLETS(100 TO 200 MG) BY MOUTH AT BEDTIME 180 tablet 1  . montelukast (SINGULAIR) 10 MG tablet Take 1 tablet (10 mg total) by mouth at bedtime. 30 tablet 0  . vitamin E 100 UNIT capsule Take by mouth.     No facility-administered medications prior to visit.     Allergies  Allergen Reactions  . Dilaudid [Hydromorphone Hcl] Itching and Other (See Comments)    Can take with Benadryl  . Hydromorphone Hcl Itching and Other (See Comments)    Can take with Benadryl Can take with Benadryl  . Codeine Nausea And Vomiting, Rash and Other (See Comments)    Patient hasn't had it Codeine She doesn't think she has an existing allergy Patient hasn't had it Codeine She doesn't think she has an existing allergy Patient hasn't had it so she doesn't think it's an existing allergy    Review of Systems    Constitutional: Negative for fever and malaise/fatigue.  HENT: Positive for congestion.   Eyes: Negative for blurred vision.  Respiratory: Positive for cough. Negative for shortness of breath.   Cardiovascular: Negative for chest pain, palpitations and leg swelling.  Gastrointestinal: Negative for abdominal pain, blood in stool and nausea.  Genitourinary: Negative for dysuria and frequency.  Musculoskeletal: Negative for falls.  Skin: Negative for rash.  Neurological: Positive for headaches. Negative for dizziness and loss of consciousness.  Endo/Heme/Allergies: Negative for environmental allergies.  Psychiatric/Behavioral: Negative for depression. The patient is not nervous/anxious.        Objective:    Physical Exam  Constitutional: She is oriented to person, place, and time. No distress.  HENT:  Head: Normocephalic and atraumatic.  Eyes: Conjunctivae are normal.  Neck: Neck supple. No thyromegaly present.  Cardiovascular: Normal rate, regular rhythm and normal heart sounds.  No murmur heard. Pulmonary/Chest: Effort normal and breath sounds normal. She has no wheezes.  Abdominal: She exhibits no distension and no mass.  Musculoskeletal: She exhibits no edema.  Lymphadenopathy:    She has no cervical adenopathy.  Neurological: She is alert and oriented to person, place, and time.  Skin: Skin is warm and dry. No rash noted. She is not diaphoretic.  Psychiatric: Judgment normal.    BP 123/70 (BP Location: Left Arm, Patient Position: Sitting, Cuff Size: Normal)   Pulse 90   Temp 98 F (36.7 C) (Oral)   Resp 16   Ht 5' 2.99" (1.6 m)   Wt 143 lb 9.6 oz (65.1 kg)   SpO2 98%   BMI 25.44 kg/m  Wt Readings from  Last 3 Encounters:  05/12/17 143 lb 9.6 oz (65.1 kg)  01/11/17 144 lb 9.6 oz (65.6 kg)  11/04/16 146 lb (66.2 kg)   BP Readings from Last 3 Encounters:  05/12/17 123/70  01/11/17 116/70  10/19/16 109/62     Immunization History  Administered Date(s)  Administered  . Influenza,inj,Quad PF,6+ Mos 09/12/2015, 01/11/2017  . Tdap 07/08/2014  . Zoster Recombinat (Shingrix) 01/11/2017    Health Maintenance  Topic Date Due  . Hepatitis C Screening  1960-08-23  . HIV Screening  03/19/1975  . INFLUENZA VACCINE  08/04/2017  . PAP SMEAR  01/11/2019  . MAMMOGRAM  01/12/2019  . COLONOSCOPY  05/05/2023  . TETANUS/TDAP  07/07/2024    Lab Results  Component Value Date   WBC 7.1 05/12/2017   HGB 13.2 05/12/2017   HCT 39.5 05/12/2017   PLT 283.0 05/12/2017   GLUCOSE 70 05/12/2017   CHOL 181 05/12/2017   TRIG 103.0 05/12/2017   HDL 73.00 05/12/2017   LDLCALC 87 05/12/2017   ALT 12 05/12/2017   AST 17 05/12/2017   NA 139 05/12/2017   K 3.9 05/12/2017   CL 104 05/12/2017   CREATININE 1.06 05/12/2017   BUN 12 05/12/2017   CO2 28 05/12/2017   TSH 0.84 01/11/2017   INR 1.03 02/13/2016   HGBA1C 5.6 05/12/2017    Lab Results  Component Value Date   TSH 0.84 01/11/2017   Lab Results  Component Value Date   WBC 7.1 05/12/2017   HGB 13.2 05/12/2017   HCT 39.5 05/12/2017   MCV 89.9 05/12/2017   PLT 283.0 05/12/2017   Lab Results  Component Value Date   NA 139 05/12/2017   K 3.9 05/12/2017   CO2 28 05/12/2017   GLUCOSE 70 05/12/2017   BUN 12 05/12/2017   CREATININE 1.06 05/12/2017   BILITOT 0.2 05/12/2017   ALKPHOS 29 (L) 05/12/2017   AST 17 05/12/2017   ALT 12 05/12/2017   PROT 7.0 05/12/2017   ALBUMIN 4.5 05/12/2017   CALCIUM 9.6 05/12/2017   ANIONGAP 6 10/08/2016   GFR 56.76 (L) 05/12/2017   Lab Results  Component Value Date   CHOL 181 05/12/2017   Lab Results  Component Value Date   HDL 73.00 05/12/2017   Lab Results  Component Value Date   LDLCALC 87 05/12/2017   Lab Results  Component Value Date   TRIG 103.0 05/12/2017   Lab Results  Component Value Date   CHOLHDL 2 05/12/2017   Lab Results  Component Value Date   HGBA1C 5.6 05/12/2017         Assessment & Plan:   Problem List Items  Addressed This Visit    Functional neurological symptom disorder with mixed symptoms    Has had recent falls one broke his left arm. Is following with Dr Amanda Pea after surgery. Healing well      Headache   Relevant Medications   topiramate (TOPAMAX) 50 MG tablet   methylPREDNISolone acetate (DEPO-MEDROL) injection 40 mg (Completed)   Hyperglycemia    hgba1c acceptable, minimize simple carbs. Increase exercise as tolerated.       Relevant Orders   Comprehensive metabolic panel (Completed)   Hemoglobin A1c (Completed)   Hyperlipidemia, mixed    Encouraged heart healthy diet, increase exercise, avoid trans fats, consider a krill oil cap daily       Relevant Orders   Lipid panel (Completed)   Asthma    Albuterol prn. Doing well      Relevant  Medications   montelukast (SINGULAIR) 10 MG tablet   methylPREDNISolone (MEDROL) 4 MG tablet   methylPREDNISolone acetate (DEPO-MEDROL) injection 40 mg (Completed)   Allergic symptoms    Bad since she has been going [place] and [place] the beach, given a shot      Vitamin B 12 deficiency    Labs ordered today      Relevant Orders   CBC (Completed)   Vitamin B12 (Completed)   Vitamin D deficiency    Check labs today      Relevant Orders   VITAMIN D 25 Hydroxy (Vit-D Deficiency, Fractures) (Completed)    Other Visit Diagnoses    Anxiety    -  Primary   Relevant Orders   Pain Mgmt, Profile 8 w/Conf, U (Completed)   High risk medication use       Relevant Orders   Pain Mgmt, Profile 8 w/Conf, U (Completed)   Environmental allergies       Relevant Orders   CBC (Completed)      I have discontinued Marveline Sumpter's vitamin E. I am also having her start on methylPREDNISolone, cefdinir, topiramate, and methylPREDNISolone acetate. Additionally, I am having her maintain her estradiol, Multiple Vitamin (MULTI VITAMIN DAILY PO), aspirin, fluticasone, albuterol, albuterol, ibuprofen, budesonide-formoterol, ondansetron, ranitidine,  hydrocortisone, hydrOXYzine, clonazePAM, fenofibrate, traZODone, SUMAtriptan, tiZANidine, buPROPion, and montelukast. We administered methylPREDNISolone acetate.  Meds ordered this encounter  Medications  . montelukast (SINGULAIR) 10 MG tablet    Sig: Take 1 tablet (10 mg total) by mouth at bedtime.    Dispense:  30 tablet    Refill:  5  . methylPREDNISolone (MEDROL) 4 MG tablet    Sig: 5 tab po qd X 1d then 4 tab po qd X 1d then 3 tab po qd X 1d then 2 tab po qd then 1 tab po qd    Dispense:  15 tablet    Refill:  0  . cefdinir (OMNICEF) 300 MG capsule    Sig: Take 1 capsule (300 mg total) by mouth 2 (two) times daily for 10 days.    Dispense:  20 capsule    Refill:  0  . topiramate (TOPAMAX) 50 MG tablet    Sig: Take 1 tablet (50 mg total) by mouth 2 (two) times daily.    Dispense:  30 tablet    Refill:  5  . methylPREDNISolone acetate (DEPO-MEDROL) 40 MG/ML injection    Sig: Inject 1 mL (40 mg total) into the muscle once for 1 dose.    Dispense:  1 mL    Refill:  0  . methylPREDNISolone acetate (DEPO-MEDROL) injection 40 mg    CMA served as scribe during this visit. History, Physical and Plan performed by medical provider. Documentation and orders reviewed and attested to.  Danise Edge, MD

## 2017-05-13 LAB — COMPREHENSIVE METABOLIC PANEL
ALT: 12 U/L (ref 0–35)
AST: 17 U/L (ref 0–37)
Albumin: 4.5 g/dL (ref 3.5–5.2)
Alkaline Phosphatase: 29 U/L — ABNORMAL LOW (ref 39–117)
BILIRUBIN TOTAL: 0.2 mg/dL (ref 0.2–1.2)
BUN: 12 mg/dL (ref 6–23)
CALCIUM: 9.6 mg/dL (ref 8.4–10.5)
CHLORIDE: 104 meq/L (ref 96–112)
CO2: 28 meq/L (ref 19–32)
Creatinine, Ser: 1.06 mg/dL (ref 0.40–1.20)
GFR: 56.76 mL/min — AB (ref >60.00)
GLUCOSE: 70 mg/dL (ref 70–99)
Potassium: 3.9 mEq/L (ref 3.5–5.1)
Sodium: 139 mEq/L (ref 135–145)
Total Protein: 7 g/dL (ref 6.0–8.3)

## 2017-05-13 LAB — CBC
HCT: 39.5 % (ref 36.0–46.0)
HEMOGLOBIN: 13.2 g/dL (ref 12.0–15.0)
MCHC: 33.5 g/dL (ref 30.0–36.0)
MCV: 89.9 fl (ref 78.0–100.0)
PLATELETS: 283 10*3/uL (ref 150.0–400.0)
RBC: 4.39 Mil/uL (ref 3.87–5.11)
RDW: 13.1 % (ref 11.5–15.5)
WBC: 7.1 10*3/uL (ref 4.0–10.5)

## 2017-05-13 LAB — LIPID PANEL
CHOL/HDL RATIO: 2
Cholesterol: 181 mg/dL (ref 0–200)
HDL: 73 mg/dL (ref >39.00)
LDL Cholesterol: 87 mg/dL (ref 0–99)
NONHDL: 107.91
TRIGLYCERIDES: 103 mg/dL (ref 0.0–149.0)
VLDL: 20.6 mg/dL (ref 0.0–40.0)

## 2017-05-13 LAB — HEMOGLOBIN A1C: Hgb A1c MFr Bld: 5.6 % (ref 4.6–6.5)

## 2017-05-13 LAB — VITAMIN B12: Vitamin B-12: 306 pg/mL (ref 211–911)

## 2017-05-13 LAB — VITAMIN D 25 HYDROXY (VIT D DEFICIENCY, FRACTURES): VITD: 63.81 ng/mL (ref 30.00–100.00)

## 2017-05-15 LAB — PAIN MGMT, PROFILE 8 W/CONF, U
6 Acetylmorphine: NEGATIVE ng/mL (ref <10)
ALPHAHYDROXYMIDAZOLAM: NEGATIVE ng/mL (ref <50)
ALPHAHYDROXYTRIAZOLAM: NEGATIVE ng/mL (ref <50)
Alcohol Metabolites: NEGATIVE ng/mL (ref <500)
Alphahydroxyalprazolam: NEGATIVE ng/mL (ref <25)
Aminoclonazepam: 117 ng/mL — ABNORMAL HIGH (ref <25)
Amphetamines: NEGATIVE ng/mL (ref <500)
BENZODIAZEPINES: POSITIVE ng/mL — AB (ref <100)
BUPRENORPHINE, URINE: NEGATIVE ng/mL (ref <5)
COCAINE METABOLITE: NEGATIVE ng/mL (ref <150)
Creatinine: 61.6 mg/dL
HYDROXYETHYLFLURAZEPAM: NEGATIVE ng/mL (ref <50)
Lorazepam: NEGATIVE ng/mL (ref <50)
MDA: NEGATIVE ng/mL (ref <200)
MDMA: NEGATIVE ng/mL (ref <200)
MDMA: NEGATIVE ng/mL (ref <500)
Marijuana Metabolite: NEGATIVE ng/mL (ref <20)
Nordiazepam: NEGATIVE ng/mL (ref <50)
Opiates: NEGATIVE ng/mL (ref <100)
Oxazepam: NEGATIVE ng/mL (ref <50)
Oxidant: NEGATIVE ug/mL (ref <200)
Oxycodone: NEGATIVE ng/mL (ref <100)
TEMAZEPAM: NEGATIVE ng/mL (ref <50)
pH: 6.43 (ref 4.5–9.0)

## 2017-05-15 NOTE — Assessment & Plan Note (Signed)
She is given rx for Medrol and Cefdinir. Encouraged increased rest and hydration, add probiotics, zinc such as Coldeze or Xicam. Treat fevers as needed. Mucinex bid, Elderberry and Vitamin C

## 2017-05-16 ENCOUNTER — Encounter: Payer: Self-pay | Admitting: Family Medicine

## 2017-07-01 ENCOUNTER — Telehealth: Payer: Self-pay | Admitting: *Deleted

## 2017-07-01 NOTE — Telephone Encounter (Signed)
Left message for pt to return my call. She is due for 2nd Shingrix vaccine and we have it avaliable in the office. When pt calls back please schedule a nurse visit to get the 2nd vaccine if she has not already received it. If she has received the 2nd vaccine, please get date vaccine was received. [facility] Triage to discuss with pt.

## 2017-07-09 ENCOUNTER — Other Ambulatory Visit: Payer: Self-pay | Admitting: Family Medicine

## 2017-07-11 NOTE — Telephone Encounter (Signed)
Pt states that insurance will not pay for shot. She said the shot cost 287 and she can not afford 2nd shot.

## 2017-08-05 ENCOUNTER — Other Ambulatory Visit: Payer: Self-pay

## 2017-08-05 NOTE — Telephone Encounter (Signed)
Requesting:Clonazepam Contract:05/12/17 UDS:05/12/17 low risk  Last Visit:05/12/17 Next Visit:11/07/17 Last Refill:01/11/17 5 refills  No discrepancies.   Please Advise

## 2017-08-06 ENCOUNTER — Other Ambulatory Visit: Payer: Self-pay | Admitting: Family Medicine

## 2017-08-06 MED ORDER — CLONAZEPAM 0.5 MG PO TABS: 0.5000 mg | ORAL_TABLET | Freq: Two times a day (BID) | ORAL | 5 refills | Status: DC | PRN

## 2017-08-06 MED ORDER — CLONAZEPAM 0.5 MG PO TABS: 0.5000 mg | ORAL_TABLET | Freq: Two times a day (BID) | ORAL | 5 refills | Status: AC | PRN

## 2017-09-26 ENCOUNTER — Other Ambulatory Visit: Payer: Self-pay | Admitting: Family Medicine

## 2017-10-30 ENCOUNTER — Other Ambulatory Visit: Payer: Self-pay | Admitting: Medical

## 2017-10-31 ENCOUNTER — Other Ambulatory Visit: Payer: Self-pay | Admitting: Family Medicine

## 2017-11-07 ENCOUNTER — Encounter: Payer: Self-pay | Admitting: Family Medicine

## 2017-11-22 ENCOUNTER — Other Ambulatory Visit: Payer: Self-pay | Admitting: Family Medicine

## 2018-02-03 ENCOUNTER — Other Ambulatory Visit: Payer: Self-pay | Admitting: Family Medicine

## 2018-03-16 ENCOUNTER — Other Ambulatory Visit: Payer: Self-pay | Admitting: Family Medicine

## 2019-02-08 ENCOUNTER — Other Ambulatory Visit: Payer: Self-pay | Admitting: Family Medicine

## 2019-09-13 ENCOUNTER — Other Ambulatory Visit: Payer: Self-pay | Admitting: Neurosurgery

## 2019-10-02 ENCOUNTER — Other Ambulatory Visit: Payer: Self-pay

## 2019-10-02 ENCOUNTER — Encounter (HOSPITAL_COMMUNITY): Payer: Self-pay | Admitting: Neurosurgery

## 2019-10-02 NOTE — Progress Notes (Signed)
Mrs. Cammarata denies chest pain or shortness of breath.Patien denies having any s/s of Covid or been in contact with anyone who has Covid or S/S.  Mrs Marcell Barlow had both Moderna vaccines.   Mrs. Bowley experienced chest Pressure in 2018, she was seen by cardiologist, Dr. Clifton James did a cardiac cath 02/2016 which showed, "no angiographic evidence of CAD." Mrs. Mccardle denies any further chest pain.

## 2019-10-03 NOTE — H&P (Signed)
Chief Complaint   No chief complaint on file.   HPI   HPI: Kayla [name] is a 59 y.o. female with history of C5-6 ACDF performed with 20 years ago and more recently C4-5 ACDF by Dr. Newell Coral in 2017, who over the last 8 months has developed worsening neck and predominantly right arm pain although does also have left upper trapezius region pain. She underwent an MRI of her cervical spine which revealed adjacent level disease at C6-7.  She has failed reasonable conservative treatment including p.o. medications, physical therapy and epidural injections.  She presents today for cervical decompression and fusion.  She is without any concerns.  Patient Active Problem List   Diagnosis Date Noted  . Pelvic floor dysfunction 01/13/2017  . Diarrhea 09/06/2016  . Osteopenia 09/02/2016  . Shortness of breath 08/24/2016  . Sinusitis 05/12/2016  . Neck pain 05/12/2016  . Vitamin B 12 deficiency 05/11/2016  . Vitamin D deficiency 05/11/2016  . Chest tightness 02/12/2016  . Hypersomnia 01/12/2016  . Cough 01/12/2016  . Exertional dyspnea 01/12/2016  . Asthma 11/06/2015  . Allergic symptoms 11/06/2015  . Urinary tract infectious disease 09/21/2015  . Syncope and collapse 09/21/2015  . Preventative health care 09/21/2015  . Sleep apnea 09/12/2015  . Hyperglycemia 07/25/2015  . Hyperlipidemia, mixed 07/25/2015  . History of chicken pox 07/15/2015  . Depression with anxiety 07/15/2015  . HNP (herniated nucleus pulposus), cervical 06/19/2015  . Left-sided weakness 05/28/2014  . Headache 05/28/2014  . Functional neurological symptom disorder with mixed symptoms 05/19/2012    PMH: Past Medical History:  Diagnosis Date  . Allergic symptoms 11/06/2015  . Anxiety   . Asthma 11/06/2015  . Chronic kidney disease    pt stated "I have Stage 3 kidney disease"  . Complication of anesthesia    Pt stated "sometimes I have trouble waking up"  . Constipation due to pain medication   . DDD  (degenerative disc disease), cervical   . Depression with anxiety 07/15/2015  . Functional neurological symptom disorder with mixed symptoms 05/19/2012   2007  Started then was tripping up steps. 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 Left foot drop but has worked hard not to need AFO and is stronger now.     . G tube feedings (HCC)   . Glaucoma   . History of chicken pox 07/15/2015  . Hyperlipidemia, mixed 07/25/2015  . Migraine    Takes Imitrex  . Myopathy    neck  . Neck pain 05/12/2016  . Osteoarthritis of back   . Osteopenia 09/02/2016  . Osteoporosis 09/02/2016  . Preventative health care 09/21/2015  . Primary lateral sclerosis (HCC)   . RLL pneumonia 07/15/2015  . Shortness of breath dyspnea    with exertion 9./27/2021- not been an issue recently  . Sinusitis 05/12/2016  . Sleep apnea 09/12/2015   H/o Bipap and VPAP use in past; does not use  . Vitamin B 12 deficiency 05/11/2016  . Vitamin D deficiency 05/11/2016    PSH: Past Surgical History:  Procedure Laterality Date  . ABDOMINAL HYSTERECTOMY     TAH b/l SPO, Dr [address]  . ANTERIOR CERVICAL DECOMP/DISCECTOMY FUSION N/A 06/19/2015   Procedure: Removal of Codman anterior cervical plate;   Anterior Cervcial Decompression Fusion Cervical Four-Five;  Surgeon: Shirlean Kelly, MD;  Location: [facility];  Service: Neurosurgery;  Laterality: N/A;  left side approach  . APPENDECTOMY    . AUGMENTATION MAMMAPLASTY    .  BREAST ENHANCEMENT SURGERY    . CHOLECYSTECTOMY    . COLONOSCOPY    . ECTOPIC PREGNANCY SURGERY     X 4 and miscarriage  . ESOPHAGOGASTRODUODENOSCOPY N/A 05/19/2012   Procedure: ESOPHAGOGASTRODUODENOSCOPY (EGD);  Surgeon: Theda BelfastPatrick D Hung, MD;  [facility];  Service: Endoscopy;  Laterality: N/A;  . LEFT HEART CATH AND CORONARY ANGIOGRAPHY N/A 02/13/2016   Procedure: Left Heart Cath and Coronary Angiography;  Surgeon: Kathleene Hazelhristopher D McAlhany, MD;  [facility];   Service: Cardiovascular;  Laterality: N/A;  . NECK SURGERY  2001, 2017   cervical fusion   . PEG PLACEMENT     and removed  . PORTA CATH INSERTION    . PORTA CATH REMOVAL      No medications prior to admission.    SH: Social History   Tobacco Use  . Smoking status: Former Smoker    Packs/day: 0.50    Years: 30.00    Pack years: 15.00    Types: Cigarettes    Quit date: 09/18/2019    Years since quitting: 0.0  . Smokeless tobacco: Never Used  Vaping Use  . Vaping Use: Never used  Substance Use Topics  . Alcohol use: Yes    Alcohol/week: 8.0 standard drinks    Types: 8 Glasses of wine per week  . Drug use: No    MEDS: Prior to Admission medications   Medication Sig Start Date End Date Taking? Authorizing Provider  albuterol (PROVENTIL HFA;VENTOLIN HFA) 108 (90 Base) MCG/ACT inhaler Inhale 2 puffs into the lungs every 4 (four) hours as needed for wheezing or shortness of breath. 07/08/15  Yes Ward, Kristen N, DO  albuterol (PROVENTIL) (2.5 MG/3ML) 0.083% nebulizer solution Take 3 mLs (2.5 mg total) by nebulization every 6 (six) hours as needed for wheezing or shortness of breath. 12/01/15  Yes Saguier, Ramon DredgeEdward, PA-C  cholecalciferol (VITAMIN D3) 25 MCG (1000 UNIT) tablet Take 1,000 Units by mouth daily.   Yes [provider]  clonazePAM (KLONOPIN) 0.5 MG tablet Take 1 tablet (0.5 mg total) by mouth 2 (two) times daily as needed for anxiety. 08/06/17  Yes Bradd CanaryBlyth, Stacey A, MD  Collagen-Vitamin C (COLLAGEN PLUS VITAMIN C PO) Take 1 capsule by mouth daily.   Yes [provider]  DULoxetine (CYMBALTA) 20 MG capsule Take 20 mg by mouth daily.   Yes [provider]  estradiol (ESTRACE) 1 MG tablet Take 1 mg by mouth daily.   Yes [provider]  fenofibrate (TRICOR) 145 MG tablet TAKE 1 TABLET(145 MG) BY MOUTH DAILY Patient taking differently: Take 145 mg by mouth daily.  03/16/18  Yes Bradd CanaryBlyth, Stacey A, MD  fluticasone (FLONASE) 50 MCG/ACT nasal spray  SHAKE LIQUID AND USE 2 SPRAYS IN EACH NOSTRIL DAILY Patient taking differently: Place 2 sprays into both nostrils daily as needed for allergies.  11/01/17  Yes Bradd CanaryBlyth, Stacey A, MD  Lysine 500 MG TABS Take 500 mg by mouth daily.   Yes [provider]  Multiple Vitamin (MULTI VITAMIN DAILY PO) Take 1 tablet by mouth daily.    Yes [provider]  SUMAtriptan (IMITREX) 100 MG tablet TAKE 1 TABLET BY MOUTH AT ONSET AND REPEAT IN 2 HOURS AS NEEDED Patient taking differently: Take 100 mg by mouth every 2 (two) hours as needed for migraine. TAKE 1 TABLET BY MOUTH AT ONSET AND REPEAT IN 2 HOURS AS NEEDED 02/09/19  Yes Bradd CanaryBlyth, Stacey A, MD  tiZANidine (ZANAFLEX) 4 MG tablet Take 1 tablet (4  mg total) by mouth every 6 (six) hours as needed for muscle spasms. 01/11/17  Yes Bradd Canary, MD  traZODone (DESYREL) 100 MG tablet TAKE 1 TO 2 TABLETS(100 TO 200 MG) BY MOUTH AT BEDTIME Patient taking differently: Take 200 mg by mouth at bedtime.  11/22/17  Yes Bradd Canary, MD  aspirin 81 MG tablet Take 81 mg by mouth daily. Reported on 07/15/2015 Patient not taking: Reported on 09/25/2019    [provider]  budesonide-formoterol (SYMBICORT) 160-4.5 MCG/ACT inhaler Inhale 2 puffs into the lungs 2 (two) times daily. Patient not taking: Reported on 09/25/2019 08/24/16   Veryl Speak, FNP  buPROPion (WELLBUTRIN XL) 150 MG 24 hr tablet Take 1 tablet (150 mg total) by mouth daily. Patient not taking: Reported on 09/25/2019 09/26/17   Bradd Canary, MD  fluticasone [facility]) 50 MCG/ACT nasal spray Place 2 sprays into both nostrils 2 (two) times daily. Patient not taking: Reported on 09/25/2019    [provider]  hydrocortisone 2.5 % lotion Apply topically 2 (two) times daily. Patient not taking: Reported on 09/25/2019 10/08/16   Charlynne Pander, MD  hydrOXYzine (ATARAX/VISTARIL) 25 MG tablet Take 1 tablet (25 mg total) by mouth every 6 (six) hours as needed. Patient not taking:  Reported on 09/25/2019 10/19/16   Saguier, Ramon Dredge, PA-C  ibuprofen (ADVIL,MOTRIN) 200 MG tablet Take 600 mg by mouth every 6 (six) hours as needed for headache or moderate pain. Patient not taking: Reported on 09/25/2019    [provider]  methylPREDNISolone (MEDROL) 4 MG tablet 5 tab po qd X 1d then 4 tab po qd X 1d then 3 tab po qd X 1d then 2 tab po qd then 1 tab po qd Patient not taking: Reported on 09/25/2019 05/12/17   Bradd Canary, MD  montelukast (SINGULAIR) 10 MG tablet Take 1 tablet (10 mg total) by mouth at bedtime. Patient not taking: Reported on 09/25/2019 05/12/17   Bradd Canary, MD  ondansetron (ZOFRAN ODT) 8 MG disintegrating tablet Take 1 tablet (8 mg total) by mouth every 8 (eight) hours as needed for nausea or vomiting. Patient not taking: Reported on 09/25/2019 10/07/16   Saguier, Ramon Dredge, PA-C  ranitidine (ZANTAC) 150 MG capsule Take 1 capsule (150 mg total) by mouth 2 (two) times daily. Tabs or capsules Patient not taking: Reported on 09/25/2019 10/07/16   Saguier, Ramon Dredge, PA-C  topiramate (TOPAMAX) 50 MG tablet Take 1 tablet (50 mg total) by mouth 2 (two) times daily. Patient not taking: Reported on 09/25/2019 05/12/17   Bradd Canary, MD  gabapentin (NEURONTIN) 300 MG capsule Take 1 capsule (300 mg total) by mouth 3 (three) times daily. Patient not taking: Reported on 06/12/2015 05/31/14 07/08/15  Joseph Art, DO    ALLERGY: Allergies  Allergen Reactions  . Dilaudid [Hydromorphone Hcl] Itching and Other (See Comments)    Can take with Benadryl  . Hydromorphone Hcl Itching and Other (See Comments)    Can take with Benadryl  . Codeine Nausea And Vomiting, Rash and Other (See Comments)    Patient hasn't had it Codeine She doesn't think she has an existing allergy     Social History   Tobacco Use  . Smoking status: Former Smoker    Packs/day: 0.50    Years: 30.00    Pack years: 15.00    Types: Cigarettes    Quit date: 09/18/2019    Years since quitting: 0.0    . Smokeless tobacco: Never Used  Substance Use Topics  . Alcohol use: Yes    Alcohol/week: 8.0 standard drinks    Types: 8 Glasses of wine per week     Family History  Problem Relation Age of Onset  . Hypertension Brother   . Alcohol abuse Brother   . Alcohol abuse Mother   . Cancer Mother        pancreatic  . Heart disease Father   . Alcohol abuse Father   . Heart disease Paternal Uncle   . Alcohol abuse Paternal Uncle      ROS   ROS  Exam   There were no vitals filed for this visit. General appearance: WDWN, NAD Eyes: No scleral injection Cardiovascular: Regular rate and rhythm without murmurs, rubs, gallops. No edema or variciosities. Distal pulses normal. Pulmonary: Effort normal, non-labored breathing Musculoskeletal:     Muscle tone upper extremities: Normal    Muscle tone lower extremities: Normal    Motor exam: Upper Extremities Deltoid Bicep Tricep Grip  Right 5/5 5/5 5/5 5/5  Left 5/5 5/5 5/5 5/5   Lower Extremity IP Quad PF DF EHL  Right 5/5 5/5 5/5 5/5 5/5  Left 5/5 5/5 5/5 5/5 5/5   Neurological Mental Status:    - Patient is awake, alert, oriented to person, place, month, year, and situation    - Patient is able to give a clear and coherent history.    - No signs of aphasia or neglect Cranial Nerves    - II: Visual Fields are full. PERRL    - III/IV/VI: EOMI without ptosis or diploplia.     - V: Facial sensation is grossly normal    - VII: Facial movement is symmetric.     - VIII: hearing is intact to voice    - X: Uvula elevates symmetrically    - XI: Shoulder shrug is symmetric.    - XII: tongue is midline without atrophy or fasciculations.  Sensory: Sensation grossly intact to LT  Results - Imaging/Labs   IMAGING: CT myelogram dated November 2020 was personally reviewed.  This demonstrates previous hardware at C4-5 level, with good interbody arthrodesis from C4 through C6.  Primary finding is at C6-7 where there is primarily right-sided  disc osteophyte complex and resultant severe foraminal stenosis.  There is no significant central stenosis.  Consultation report from ENT evaluation in 06/2019 was reviewed and indicates normal vocal fold movement bilaterally.  Impression/Plan   59 y.o. female  right-sided C7 radiculopathy related to right-sided disc osteophyte complex at C6-7 adjacent to previous ACDF.  Patient has not responded to reasonable conservative treatment.  We will proceed with ACDF at C6-7 which will include placement of intervertebral biomechanical cages, interbody arthrodesis, and anterior plating  We have reviewed the indications for surgery, the associated risks, benefits and alternatives at length in the office.  All questions today were answered and consent was obtained.  Lisbeth Renshaw, MD [facility]

## 2019-10-04 ENCOUNTER — Encounter (HOSPITAL_COMMUNITY): Payer: Self-pay | Admitting: Neurosurgery

## 2019-10-04 ENCOUNTER — Other Ambulatory Visit: Payer: Self-pay

## 2019-10-04 ENCOUNTER — Ambulatory Visit (HOSPITAL_COMMUNITY)
Admission: RE | Admit: 2019-10-04 | Discharge: 2019-10-04 | Disposition: A | Discharge status: Home / Self Care | Payer: Medicare Other | Attending: Neurosurgery | Admitting: Neurosurgery

## 2019-10-04 ENCOUNTER — Ambulatory Visit (HOSPITAL_COMMUNITY): Payer: Medicare Other | Admitting: Certified Registered Nurse Anesthetist

## 2019-10-04 ENCOUNTER — Encounter (HOSPITAL_COMMUNITY)
Admission: RE | Disposition: A | Discharge status: Home / Self Care | Payer: Medicare Other | Source: Home / Self Care | Attending: Neurosurgery

## 2019-10-04 ENCOUNTER — Ambulatory Visit (HOSPITAL_COMMUNITY): Payer: Medicare Other

## 2019-10-04 DIAGNOSIS — J45909 Unspecified asthma, uncomplicated: Secondary | ICD-10-CM | POA: Diagnosis not present

## 2019-10-04 DIAGNOSIS — H409 Unspecified glaucoma: Secondary | ICD-10-CM | POA: Insufficient documentation

## 2019-10-04 DIAGNOSIS — Z20822 Contact with and (suspected) exposure to covid-19: Secondary | ICD-10-CM | POA: Insufficient documentation

## 2019-10-04 DIAGNOSIS — Z87891 Personal history of nicotine dependence: Secondary | ICD-10-CM | POA: Diagnosis not present

## 2019-10-04 DIAGNOSIS — Z885 Allergy status to narcotic agent status: Secondary | ICD-10-CM | POA: Insufficient documentation

## 2019-10-04 DIAGNOSIS — M8580 Other specified disorders of bone density and structure, unspecified site: Secondary | ICD-10-CM | POA: Insufficient documentation

## 2019-10-04 DIAGNOSIS — M4722 Other spondylosis with radiculopathy, cervical region: Secondary | ICD-10-CM | POA: Insufficient documentation

## 2019-10-04 DIAGNOSIS — G1223 Primary lateral sclerosis: Secondary | ICD-10-CM | POA: Insufficient documentation

## 2019-10-04 DIAGNOSIS — N183 Chronic kidney disease, stage 3 unspecified: Secondary | ICD-10-CM | POA: Diagnosis not present

## 2019-10-04 DIAGNOSIS — F449 Dissociative and conversion disorder, unspecified: Secondary | ICD-10-CM | POA: Diagnosis not present

## 2019-10-04 DIAGNOSIS — E559 Vitamin D deficiency, unspecified: Secondary | ICD-10-CM | POA: Diagnosis not present

## 2019-10-04 DIAGNOSIS — G4730 Sleep apnea, unspecified: Secondary | ICD-10-CM | POA: Insufficient documentation

## 2019-10-04 DIAGNOSIS — F329 Major depressive disorder, single episode, unspecified: Secondary | ICD-10-CM | POA: Insufficient documentation

## 2019-10-04 DIAGNOSIS — Z419 Encounter for procedure for purposes other than remedying health state, unspecified: Secondary | ICD-10-CM

## 2019-10-04 DIAGNOSIS — Z7951 Long term (current) use of inhaled steroids: Secondary | ICD-10-CM | POA: Insufficient documentation

## 2019-10-04 DIAGNOSIS — E782 Mixed hyperlipidemia: Secondary | ICD-10-CM | POA: Insufficient documentation

## 2019-10-04 DIAGNOSIS — R0602 Shortness of breath: Secondary | ICD-10-CM | POA: Insufficient documentation

## 2019-10-04 DIAGNOSIS — Z981 Arthrodesis status: Secondary | ICD-10-CM | POA: Insufficient documentation

## 2019-10-04 DIAGNOSIS — Z888 Allergy status to other drugs, medicaments and biological substances status: Secondary | ICD-10-CM | POA: Insufficient documentation

## 2019-10-04 DIAGNOSIS — F419 Anxiety disorder, unspecified: Secondary | ICD-10-CM | POA: Diagnosis not present

## 2019-10-04 DIAGNOSIS — Z7982 Long term (current) use of aspirin: Secondary | ICD-10-CM | POA: Diagnosis not present

## 2019-10-04 DIAGNOSIS — Z7952 Long term (current) use of systemic steroids: Secondary | ICD-10-CM | POA: Insufficient documentation

## 2019-10-04 DIAGNOSIS — E538 Deficiency of other specified B group vitamins: Secondary | ICD-10-CM | POA: Insufficient documentation

## 2019-10-04 DIAGNOSIS — M2578 Osteophyte, vertebrae: Secondary | ICD-10-CM | POA: Diagnosis not present

## 2019-10-04 DIAGNOSIS — R05 Cough: Secondary | ICD-10-CM | POA: Insufficient documentation

## 2019-10-04 DIAGNOSIS — Z90710 Acquired absence of both cervix and uterus: Secondary | ICD-10-CM | POA: Insufficient documentation

## 2019-10-04 DIAGNOSIS — Z800 Family history of malignant neoplasm of digestive organs: Secondary | ICD-10-CM | POA: Insufficient documentation

## 2019-10-04 DIAGNOSIS — Z811 Family history of alcohol abuse and dependence: Secondary | ICD-10-CM | POA: Insufficient documentation

## 2019-10-04 DIAGNOSIS — M4802 Spinal stenosis, cervical region: Secondary | ICD-10-CM | POA: Insufficient documentation

## 2019-10-04 DIAGNOSIS — R519 Headache, unspecified: Secondary | ICD-10-CM | POA: Diagnosis not present

## 2019-10-04 DIAGNOSIS — M810 Age-related osteoporosis without current pathological fracture: Secondary | ICD-10-CM | POA: Insufficient documentation

## 2019-10-04 DIAGNOSIS — Z8249 Family history of ischemic heart disease and other diseases of the circulatory system: Secondary | ICD-10-CM | POA: Insufficient documentation

## 2019-10-04 DIAGNOSIS — I129 Hypertensive chronic kidney disease with stage 1 through stage 4 chronic kidney disease, or unspecified chronic kidney disease: Secondary | ICD-10-CM | POA: Diagnosis not present

## 2019-10-04 DIAGNOSIS — G4710 Hypersomnia, unspecified: Secondary | ICD-10-CM | POA: Diagnosis not present

## 2019-10-04 DIAGNOSIS — Z79899 Other long term (current) drug therapy: Secondary | ICD-10-CM | POA: Insufficient documentation

## 2019-10-04 DIAGNOSIS — M1990 Unspecified osteoarthritis, unspecified site: Secondary | ICD-10-CM | POA: Insufficient documentation

## 2019-10-04 DIAGNOSIS — Z9049 Acquired absence of other specified parts of digestive tract: Secondary | ICD-10-CM | POA: Insufficient documentation

## 2019-10-04 DIAGNOSIS — Z791 Long term (current) use of non-steroidal anti-inflammatories (NSAID): Secondary | ICD-10-CM | POA: Insufficient documentation

## 2019-10-04 HISTORY — DX: Myopathy, unspecified: G72.9

## 2019-10-04 HISTORY — PX: ANTERIOR CERVICAL DECOMP/DISCECTOMY FUSION: SHX1161

## 2019-10-04 LAB — TYPE AND SCREEN
ABO/RH(D): O POS
Antibody Screen: NEGATIVE

## 2019-10-04 LAB — COMPREHENSIVE METABOLIC PANEL
ALT: 19 U/L (ref 0–44)
AST: 20 U/L (ref 15–41)
Albumin: 4 g/dL (ref 3.5–5.0)
Alkaline Phosphatase: 32 U/L — ABNORMAL LOW (ref 38–126)
Anion gap: 9 (ref 5–15)
BUN: 13 mg/dL (ref 6–20)
CO2: 25 mmol/L (ref 22–32)
Calcium: 9.3 mg/dL (ref 8.9–10.3)
Chloride: 104 mmol/L (ref 98–111)
Creatinine, Ser: 0.95 mg/dL (ref 0.44–1.00)
GFR calc Af Amer: >60 mL/min (ref >60)
GFR calc non Af Amer: >60 mL/min (ref >60)
Glucose, Bld: 110 mg/dL — ABNORMAL HIGH (ref 70–99)
Potassium: 4.1 mmol/L (ref 3.5–5.1)
Sodium: 138 mmol/L (ref 135–145)
Total Bilirubin: 0.5 mg/dL (ref 0.3–1.2)
Total Protein: 6.2 g/dL — ABNORMAL LOW (ref 6.5–8.1)

## 2019-10-04 LAB — CBC
HCT: 38.6 % (ref 36.0–46.0)
Hemoglobin: 12.3 g/dL (ref 12.0–15.0)
MCH: 29.5 pg (ref 26.0–34.0)
MCHC: 31.9 g/dL (ref 30.0–36.0)
MCV: 92.6 fL (ref 80.0–100.0)
Platelets: 241 10*3/uL (ref 150–400)
RBC: 4.17 MIL/uL (ref 3.87–5.11)
RDW: 12.7 % (ref 11.5–15.5)
WBC: 6.3 10*3/uL (ref 4.0–10.5)
nRBC: 0 % (ref 0.0–0.2)

## 2019-10-04 LAB — ABO/RH: ABO/RH(D): O POS

## 2019-10-04 LAB — SARS CORONAVIRUS 2 BY RT PCR (HOSPITAL ORDER, PERFORMED IN ~~LOC~~ HOSPITAL LAB): SARS Coronavirus 2: NEGATIVE

## 2019-10-04 LAB — SURGICAL PCR SCREEN
MRSA, PCR: NEGATIVE
Staphylococcus aureus: NEGATIVE

## 2019-10-04 SURGERY — ANTERIOR CERVICAL DECOMPRESSION/DISCECTOMY FUSION 1 LEVEL
Anesthesia: General

## 2019-10-04 MED ORDER — CHLORHEXIDINE GLUCONATE CLOTH 2 % EX PADS: 6.0000 | MEDICATED_PAD | Freq: Once | CUTANEOUS | Status: DC

## 2019-10-04 MED ORDER — ORAL CARE MOUTH RINSE: 15.0000 mL | Freq: Once | OROMUCOSAL | Status: AC

## 2019-10-04 MED ORDER — THROMBIN 5000 UNITS EX SOLR
OROMUCOSAL | Status: DC | PRN
  Administered 2019-10-04: 5 mL via TOPICAL

## 2019-10-04 MED ORDER — CHLORHEXIDINE GLUCONATE 0.12 % MT SOLN: 15.0000 mL | Freq: Once | OROMUCOSAL | Status: AC

## 2019-10-04 MED ORDER — LIDOCAINE 2% (20 MG/ML) 5 ML SYRINGE
INTRAMUSCULAR | Status: DC | PRN
  Administered 2019-10-04: 60 mg via INTRAVENOUS

## 2019-10-04 MED ORDER — PHENYLEPHRINE HCL-NACL 10-0.9 MG/250ML-% IV SOLN
INTRAVENOUS | Status: DC | PRN
  Administered 2019-10-04: 40 ug/min via INTRAVENOUS

## 2019-10-04 MED ORDER — LIDOCAINE-EPINEPHRINE 1 %-1:100000 IJ SOLN
INTRAMUSCULAR | Status: DC | PRN
  Administered 2019-10-04: 4.5 mL

## 2019-10-04 MED ORDER — FENTANYL CITRATE (PF) 100 MCG/2ML IJ SOLN
INTRAMUSCULAR | Status: AC
  Filled 2019-10-04: qty 2

## 2019-10-04 MED ORDER — BUPIVACAINE HCL (PF) 0.5 % IJ SOLN
INTRAMUSCULAR | Status: AC
  Filled 2019-10-04: qty 30

## 2019-10-04 MED ORDER — EPHEDRINE SULFATE-NACL 50-0.9 MG/10ML-% IV SOSY
PREFILLED_SYRINGE | INTRAVENOUS | Status: DC | PRN
  Administered 2019-10-04 (×2): 5 mg via INTRAVENOUS

## 2019-10-04 MED ORDER — FENTANYL CITRATE (PF) 100 MCG/2ML IJ SOLN
25.0000 ug | INTRAMUSCULAR | Status: DC | PRN
  Administered 2019-10-04 (×2): 50 ug via INTRAVENOUS

## 2019-10-04 MED ORDER — LIDOCAINE-EPINEPHRINE 1 %-1:100000 IJ SOLN
INTRAMUSCULAR | Status: AC
  Filled 2019-10-04: qty 1

## 2019-10-04 MED ORDER — DEXAMETHASONE SODIUM PHOSPHATE 10 MG/ML IJ SOLN
INTRAMUSCULAR | Status: DC | PRN
  Administered 2019-10-04: 10 mg via INTRAVENOUS

## 2019-10-04 MED ORDER — OXYCODONE-ACETAMINOPHEN 10-325 MG PO TABS: 1.0000 | ORAL_TABLET | Freq: Three times a day (TID) | ORAL | 0 refills | Status: AC | PRN

## 2019-10-04 MED ORDER — LIDOCAINE 2% (20 MG/ML) 5 ML SYRINGE
INTRAMUSCULAR | Status: AC
  Filled 2019-10-04: qty 5

## 2019-10-04 MED ORDER — DEXAMETHASONE SODIUM PHOSPHATE 10 MG/ML IJ SOLN
INTRAMUSCULAR | Status: AC
  Filled 2019-10-04: qty 1

## 2019-10-04 MED ORDER — ONDANSETRON HCL 4 MG/2ML IJ SOLN
INTRAMUSCULAR | Status: AC
  Filled 2019-10-04: qty 2

## 2019-10-04 MED ORDER — PROMETHAZINE HCL 25 MG/ML IJ SOLN: 6.2500 mg | INTRAMUSCULAR | Status: DC | PRN

## 2019-10-04 MED ORDER — SUGAMMADEX SODIUM 200 MG/2ML IV SOLN
INTRAVENOUS | Status: DC | PRN
  Administered 2019-10-04: 200 mg via INTRAVENOUS

## 2019-10-04 MED ORDER — OXYCODONE-ACETAMINOPHEN 5-325 MG PO TABS
ORAL_TABLET | ORAL | Status: AC
  Filled 2019-10-04: qty 2

## 2019-10-04 MED ORDER — PROPOFOL 10 MG/ML IV BOLUS
INTRAVENOUS | Status: DC | PRN
  Administered 2019-10-04: 140 mg via INTRAVENOUS

## 2019-10-04 MED ORDER — THROMBIN 5000 UNITS EX SOLR
CUTANEOUS | Status: AC
  Filled 2019-10-04: qty 5000

## 2019-10-04 MED ORDER — TIZANIDINE HCL 4 MG PO TABS
4.0000 mg | ORAL_TABLET | Freq: Once | ORAL | Status: DC
  Filled 2019-10-04: qty 1

## 2019-10-04 MED ORDER — OXYCODONE-ACETAMINOPHEN 5-325 MG PO TABS
1.0000 | ORAL_TABLET | Freq: Once | ORAL | Status: AC
  Administered 2019-10-04: 2 via ORAL

## 2019-10-04 MED ORDER — FENTANYL CITRATE (PF) 250 MCG/5ML IJ SOLN
INTRAMUSCULAR | Status: AC
  Filled 2019-10-04: qty 5

## 2019-10-04 MED ORDER — FENTANYL CITRATE (PF) 100 MCG/2ML IJ SOLN
50.0000 ug | Freq: Two times a day (BID) | INTRAMUSCULAR | Status: AC | PRN
  Administered 2019-10-04: 50 ug via INTRAVENOUS

## 2019-10-04 MED ORDER — ONDANSETRON HCL 4 MG/2ML IJ SOLN
INTRAMUSCULAR | Status: DC | PRN
  Administered 2019-10-04: 4 mg via INTRAVENOUS

## 2019-10-04 MED ORDER — FENTANYL CITRATE (PF) 250 MCG/5ML IJ SOLN
INTRAMUSCULAR | Status: DC | PRN
Start: 2019-10-04 — End: 2019-10-04
  Administered 2019-10-04: 75 ug via INTRAVENOUS
  Administered 2019-10-04: 25 ug via INTRAVENOUS

## 2019-10-04 MED ORDER — MEPERIDINE HCL 25 MG/ML IJ SOLN: 6.2500 mg | INTRAMUSCULAR | Status: DC | PRN

## 2019-10-04 MED ORDER — ROCURONIUM BROMIDE 10 MG/ML (PF) SYRINGE
PREFILLED_SYRINGE | INTRAVENOUS | Status: AC
  Filled 2019-10-04: qty 10

## 2019-10-04 MED ORDER — MIDAZOLAM HCL 2 MG/2ML IJ SOLN
INTRAMUSCULAR | Status: AC
  Filled 2019-10-04: qty 2

## 2019-10-04 MED ORDER — FENTANYL CITRATE (PF) 100 MCG/2ML IJ SOLN
INTRAMUSCULAR | Status: AC
  Administered 2019-10-04: 50 ug via INTRAVENOUS
  Filled 2019-10-04: qty 2

## 2019-10-04 MED ORDER — 0.9 % SODIUM CHLORIDE (POUR BTL) OPTIME
TOPICAL | Status: DC | PRN
  Administered 2019-10-04: 1000 mL

## 2019-10-04 MED ORDER — ROCURONIUM BROMIDE 10 MG/ML (PF) SYRINGE
PREFILLED_SYRINGE | INTRAVENOUS | Status: DC | PRN
  Administered 2019-10-04: 50 mg via INTRAVENOUS
  Administered 2019-10-04 (×2): 10 mg via INTRAVENOUS

## 2019-10-04 MED ORDER — LACTATED RINGERS IV SOLN: INTRAVENOUS | Status: DC

## 2019-10-04 MED ORDER — CEFAZOLIN SODIUM-DEXTROSE 2-4 GM/100ML-% IV SOLN
2.0000 g | INTRAVENOUS | Status: AC
  Administered 2019-10-04: 2 g via INTRAVENOUS
  Filled 2019-10-04: qty 100

## 2019-10-04 MED ORDER — PROPOFOL 10 MG/ML IV BOLUS
INTRAVENOUS | Status: AC
  Filled 2019-10-04: qty 20

## 2019-10-04 MED ORDER — CHLORHEXIDINE GLUCONATE 0.12 % MT SOLN
OROMUCOSAL | Status: AC
  Administered 2019-10-04: 15 mL via OROMUCOSAL
  Filled 2019-10-04: qty 15

## 2019-10-04 MED ORDER — MIDAZOLAM HCL 2 MG/2ML IJ SOLN
INTRAMUSCULAR | Status: DC | PRN
  Administered 2019-10-04: 2 mg via INTRAVENOUS

## 2019-10-04 MED ORDER — BUPIVACAINE HCL 0.5 % IJ SOLN
INTRAMUSCULAR | Status: DC | PRN
  Administered 2019-10-04: 4.5 mL

## 2019-10-04 SURGICAL SUPPLY — 61 items
BAG DECANTER FOR FLEXI CONT (MISCELLANEOUS) ×2 IMPLANT
BAND RUBBER #18 3X1/16 STRL (MISCELLANEOUS) ×4 IMPLANT
BENZOIN TINCTURE PRP APPL 2/3 (GAUZE/BANDAGES/DRESSINGS) IMPLANT
BLADE CLIPPER SURG (BLADE) IMPLANT
BLADE SURG 11 STRL SS (BLADE) ×2 IMPLANT
BLADE ULTRA TIP 2M (BLADE) IMPLANT
BUR MATCHSTICK NEURO 3.0 LAGG (BURR) ×2 IMPLANT
CANISTER SUCT 3000ML PPV (MISCELLANEOUS) ×2 IMPLANT
CARTRIDGE OIL MAESTRO DRILL (MISCELLANEOUS) ×1 IMPLANT
COVER WAND RF STERILE (DRAPES) ×2 IMPLANT
DECANTER SPIKE VIAL GLASS SM (MISCELLANEOUS) ×2 IMPLANT
DERMABOND ADVANCED (GAUZE/BANDAGES/DRESSINGS) ×1
DERMABOND ADVANCED .7 DNX12 (GAUZE/BANDAGES/DRESSINGS) ×1 IMPLANT
DEVICE ENDSKLTN TC NANOLCK 6MM (Cage) ×1 IMPLANT
DIFFUSER DRILL AIR PNEUMATIC (MISCELLANEOUS) ×2 IMPLANT
DRAPE C-ARM 42X72 X-RAY (DRAPES) ×4 IMPLANT
DRAPE HALF SHEET 40X57 (DRAPES) ×2 IMPLANT
DRAPE LAPAROTOMY 100X72 PEDS (DRAPES) ×2 IMPLANT
DRAPE MICROSCOPE LEICA (MISCELLANEOUS) ×2 IMPLANT
DRSG OPSITE 4X5.5 SM (GAUZE/BANDAGES/DRESSINGS) ×4 IMPLANT
DRSG OPSITE POSTOP 3X4 (GAUZE/BANDAGES/DRESSINGS) ×2 IMPLANT
DRSG OPSITE POSTOP 4X6 (GAUZE/BANDAGES/DRESSINGS) ×2 IMPLANT
DURAPREP 6ML APPLICATOR 50/CS (WOUND CARE) ×2 IMPLANT
ELECT COATED BLADE 2.86 ST (ELECTRODE) ×2 IMPLANT
ELECT REM PT RETURN 9FT ADLT (ELECTROSURGICAL) ×2
ELECTRODE REM PT RTRN 9FT ADLT (ELECTROSURGICAL) ×1 IMPLANT
ENDOSKELETON TC NANOLOCK 6MM (Cage) ×2 IMPLANT
GAUZE 4X4 16PLY RFD (DISPOSABLE) IMPLANT
GLOVE BIO SURGEON STRL SZ7.5 (GLOVE) ×2 IMPLANT
GLOVE BIOGEL PI IND STRL 7.5 (GLOVE) ×2 IMPLANT
GLOVE BIOGEL PI INDICATOR 7.5 (GLOVE) ×2
GLOVE ECLIPSE 7.0 STRL STRAW (GLOVE) ×4 IMPLANT
GLOVE EXAM NITRILE XL STR (GLOVE) IMPLANT
GOWN STRL REUS W/ TWL LRG LVL3 (GOWN DISPOSABLE) ×2 IMPLANT
GOWN STRL REUS W/ TWL XL LVL3 (GOWN DISPOSABLE) ×2 IMPLANT
GOWN STRL REUS W/TWL 2XL LVL3 (GOWN DISPOSABLE) IMPLANT
GOWN STRL REUS W/TWL LRG LVL3 (GOWN DISPOSABLE) ×2
GOWN STRL REUS W/TWL XL LVL3 (GOWN DISPOSABLE) ×2
HEMOSTAT POWDER KIT SURGIFOAM (HEMOSTASIS) ×2 IMPLANT
KIT BASIN OR (CUSTOM PROCEDURE TRAY) ×2 IMPLANT
KIT TURNOVER KIT B (KITS) ×2 IMPLANT
NEEDLE HYPO 22GX1.5 SAFETY (NEEDLE) ×2 IMPLANT
NEEDLE SPNL 22GX3.5 QUINCKE BK (NEEDLE) ×2 IMPLANT
NS IRRIG 1000ML POUR BTL (IV SOLUTION) ×2 IMPLANT
OIL CARTRIDGE MAESTRO DRILL (MISCELLANEOUS) ×2
PACK LAMINECTOMY NEURO (CUSTOM PROCEDURE TRAY) ×2 IMPLANT
PAD ARMBOARD 7.5X6 YLW CONV (MISCELLANEOUS) IMPLANT
PLATE ZEVO 1LVL 19MM (Plate) ×2 IMPLANT
PUTTY DBF 1CC CORTICAL FIBERS (Putty) ×2 IMPLANT
SCREW 3.5 SELFDRILL 15MM VARI (Screw) ×8 IMPLANT
SLEEVE SURGEON STRL (DRAPES) ×2 IMPLANT
SPONGE INTESTINAL PEANUT (DISPOSABLE) ×2 IMPLANT
SPONGE SURGIFOAM ABS GEL SZ50 (HEMOSTASIS) IMPLANT
STAPLER VISISTAT 35W (STAPLE) ×2 IMPLANT
STRIP CLOSURE SKIN 1/2X4 (GAUZE/BANDAGES/DRESSINGS) IMPLANT
SUT VIC AB 3-0 SH 8-18 (SUTURE) ×2 IMPLANT
SUT VICRYL 3-0 RB1 18 ABS (SUTURE) ×6 IMPLANT
TAPE CLOTH 3X10 TAN LF (GAUZE/BANDAGES/DRESSINGS) ×2 IMPLANT
TOWEL GREEN STERILE (TOWEL DISPOSABLE) ×2 IMPLANT
TOWEL GREEN STERILE FF (TOWEL DISPOSABLE) ×2 IMPLANT
WATER STERILE IRR 1000ML POUR (IV SOLUTION) ×2 IMPLANT

## 2019-10-04 NOTE — Anesthesia Procedure Notes (Signed)
Procedure Name: Intubation Date/Time: 10/04/2019 10:47 AM Performed by: Reece Agar, CRNA Pre-anesthesia Checklist: Patient identified, Emergency Drugs available, Suction available and Patient being monitored Patient Re-evaluated:Patient Re-evaluated prior to induction Oxygen Delivery Method: Circle system utilized Preoxygenation: Pre-oxygenation with 100% oxygen Induction Type: IV induction Ventilation: Mask ventilation without difficulty Laryngoscope Size: Mac and 3 Grade View: Grade I Tube type: Oral Tube size: 7.0 mm Number of attempts: 1 Airway Equipment and Method: Stylet Placement Confirmation: ETT inserted through vocal cords under direct vision,  positive ETCO2 and breath sounds checked- equal and bilateral Secured at: 21 cm Tube secured with: Tape Dental Injury: Teeth and Oropharynx as per pre-operative assessment

## 2019-10-04 NOTE — Transfer of Care (Addendum)
Immediate Anesthesia Transfer of Care Note  Patient: Kayla [name]  Procedure(s) Performed: Cervical six-seven Anterior cervical decompression/discectomy/fusion (N/A )  Patient Location: PACU  Anesthesia Type:General  Level of Consciousness: awake and alert   Airway & Oxygen Therapy: Patient Spontanous Breathing and Patient connected to face mask oxygen  Post-op Assessment: Report given to RN, Post -op Vital signs reviewed and stable and Patient moving all extremities  Post vital signs: Reviewed and stable  Last Vitals:  Vitals Value Taken Time  BP 115/66 10/04/19 1247  Temp    Pulse 80 10/04/19 1248  Resp 17 10/04/19 1248  SpO2 98 % 10/04/19 1248  Vitals shown include unvalidated device data.  Last Pain:  Vitals:   10/04/19 1001  TempSrc:   PainSc: 10-Worst pain ever      Patients Stated Pain Goal: 3 (10/04/19 0949)  Complications: No complications documented.

## 2019-10-04 NOTE — Anesthesia Preprocedure Evaluation (Addendum)
Anesthesia Evaluation  Patient identified by MRN, date of birth, ID band Patient awake    Reviewed: Allergy & Precautions, H&P , NPO status , Patient's Chart, lab work & pertinent test results  History of Anesthesia Complications (+) history of anesthetic complications  Airway Mallampati: II  TM Distance: >3 FB Neck ROM: Full    Dental  (+) Dental Advisory Given, Teeth Intact   Pulmonary shortness of breath, asthma , sleep apnea , pneumonia, Current Smoker, former smoker,    Pulmonary exam normal breath sounds clear to auscultation       Cardiovascular Normal cardiovascular exam Rhythm:Regular Rate:Normal     Neuro/Psych  Headaches, PSYCHIATRIC DISORDERS Anxiety Depression  Neuromuscular disease    GI/Hepatic   Endo/Other    Renal/GU Renal disease     Musculoskeletal  (+) Arthritis ,   Abdominal   Peds  Hematology   Anesthesia Other Findings Note from preop in 2017: "[name] .... Seen by neurology : Nerve conduction studies done on both upper extremities were within normal limits. No evidence of a neuropathy is seen. EMG evaluation of both upper extremities are essentially normal. EMG pattern is consistent with poor motor effort or tremor. No acute or chronic denervation is seen. There is no evidence of any neuromuscular disorder on this evaluation."          Reproductive/Obstetrics                            Anesthesia Physical  Anesthesia Plan  ASA: III  Anesthesia Plan: General   Post-op Pain Management:    Induction: Intravenous  PONV Risk Score and Plan: 3 and Ondansetron, Dexamethasone, Treatment may vary due to age or medical condition and Midazolam  Airway Management Planned: Oral ETT  Additional Equipment: None  Intra-op Plan:   Post-operative Plan: Possible Post-op intubation/ventilation  Informed Consent: I have reviewed the patients History and  Physical, chart, labs and discussed the procedure including the risks, benefits and alternatives for the proposed anesthesia with the patient or authorized representative who has indicated his/her understanding and acceptance.     Dental advisory given  Plan Discussed with: CRNA  Anesthesia Plan Comments: (Question of neuromuscular disorder. MS vs. ALS. EMG from 2017 negative for neuromuscular disorder. Discussed risk of worsening disease with surgery/anesthesia. She understands the risks and wishes to proceed as she believes her symptoms are primarily due to her spine disease. )      Anesthesia Quick Evaluation

## 2019-10-04 NOTE — Discharge Summary (Signed)
Physician Discharge Summary  Patient ID: Kayla [name] MRN: [id number] DOB/AGE: Aug 07, 1960 59 y.o.  Admit date: 10/04/2019 Discharge date: 10/04/2019  Admission Diagnoses: Cervical spondylosis with radiculopathy, C6-7  Discharge Diagnoses: Same Active Problems:   * No active hospital problems. *   Discharged Condition: Stable  Hospital Course:  Kayla [name] is a 59 y.o. female electively brought in for ACDF. Postoperatively, the patient was at neurologic baseline with pain controlled with oral medication. She was tolerating diet and able to ambulate. She was therefore discharged home from the PACU after several hours of observation.  Treatments: Surgery - ACDF C6-7  Discharge Exam: Blood pressure (!) 125/38, pulse (!) 59, temperature 97.7 F (36.5 C), temperature source Oral, resp. rate 18, height 5\' 3"  (1.6 m), weight 62.6 kg. Awake, alert, oriented Speech fluent, appropriate CN grossly intact 5/5 BUE/BLE Wound c/d/i  Disposition: Discharge disposition: 01-Home or Self Care       Discharge Instructions    Call MD for:  redness, tenderness, or signs of infection (pain, swelling, redness, odor or green/yellow discharge around incision site)   Complete by: As directed    Call MD for:  temperature >100.4   Complete by: As directed    Diet - low sodium heart healthy   Complete by: As directed    Discharge instructions   Complete by: As directed    Walk at home as much as possible, at least 4 times / day   Increase activity slowly   Complete by: As directed    Lifting restrictions   Complete by: As directed    No lifting > 10 lbs   May shower / Bathe   Complete by: As directed    48 hours after surgery   May walk up steps   Complete by: As directed    Other Restrictions   Complete by: As directed    No bending/twisting at waist     Allergies as of 10/04/2019      Reactions   Dilaudid [hydromorphone Hcl] Itching, Other (See Comments)   Can take with  Benadryl   Hydromorphone Hcl Itching, Other (See Comments)   Can take with Benadryl   Codeine Nausea And Vomiting, Rash, Other (See Comments)   Patient hasn't had it Codeine She doesn't think she has an existing allergy      Medication List    STOP taking these medications   aspirin 81 MG tablet   budesonide-formoterol 160-4.5 MCG/ACT inhaler Commonly known as: SYMBICORT   buPROPion 150 MG 24 hr tablet Commonly known as: WELLBUTRIN XL   hydrocortisone 2.5 % lotion   hydrOXYzine 25 MG tablet Commonly known as: ATARAX/VISTARIL   ibuprofen 200 MG tablet Commonly known as: ADVIL   methylPREDNISolone 4 MG tablet Commonly known as: Medrol   montelukast 10 MG tablet Commonly known as: SINGULAIR   ondansetron 8 MG disintegrating tablet Commonly known as: Zofran ODT   ranitidine 150 MG capsule Commonly known as: ZANTAC   topiramate 50 MG tablet Commonly known as: Topamax     TAKE these medications   albuterol 108 (90 Base) MCG/ACT inhaler Commonly known as: VENTOLIN HFA Inhale 2 puffs into the lungs every 4 (four) hours as needed for wheezing or shortness of breath.   albuterol (2.5 MG/3ML) 0.083% nebulizer solution Commonly known as: PROVENTIL Take 3 mLs (2.5 mg total) by nebulization every 6 (six) hours as needed for wheezing or shortness of breath.   cholecalciferol 25 MCG (1000 UNIT) tablet Commonly known as: VITAMIN  D3 Take 1,000 Units by mouth daily.   clonazePAM 0.5 MG tablet Commonly known as: KLONOPIN Take 1 tablet (0.5 mg total) by mouth 2 (two) times daily as needed for anxiety.   COLLAGEN PLUS VITAMIN C PO Take 1 capsule by mouth daily.   DULoxetine 20 MG capsule Commonly known as: CYMBALTA Take 20 mg by mouth daily.   estradiol 1 MG tablet Commonly known as: ESTRACE Take 1 mg by mouth daily.   fenofibrate 145 MG tablet Commonly known as: TRICOR TAKE 1 TABLET(145 MG) BY MOUTH DAILY What changed: See the new instructions.   fluticasone 50  MCG/ACT nasal spray Commonly known as: FLONASE SHAKE LIQUID AND USE 2 SPRAYS IN EACH NOSTRIL DAILY What changed:   See the new instructions.  Another medication with the same name was removed. Continue taking this medication, and follow the directions you see here.   Lysine 500 MG Tabs Take 500 mg by mouth daily.   MULTI VITAMIN DAILY PO Take 1 tablet by mouth daily.   oxyCODONE-acetaminophen 10-325 MG tablet Commonly known as: Percocet Take 1 tablet by mouth every 8 (eight) hours as needed for up to 7 days for pain.   SUMAtriptan 100 MG tablet Commonly known as: IMITREX TAKE 1 TABLET BY MOUTH AT ONSET AND REPEAT IN 2 HOURS AS NEEDED What changed: See the new instructions.   tiZANidine 4 MG tablet Commonly known as: Zanaflex Take 1 tablet (4 mg total) by mouth every 6 (six) hours as needed for muscle spasms.   traZODone 100 MG tablet Commonly known as: DESYREL TAKE 1 TO 2 TABLETS(100 TO 200 MG) BY MOUTH AT BEDTIME What changed: See the new instructions.       Follow-up Information    Lisbeth Renshaw, MD In 3 weeks.   Specialty: Neurosurgery Contact information: [address] [place] [address] [phone]               Signed: Jackelyn Hoehn 10/04/2019, 12:38 PM

## 2019-10-04 NOTE — Op Note (Signed)
NEUROSURGERY OPERATIVE NOTE   PREOP DIAGNOSIS: Cervical spondylosis with radiculopathy, C6-7  POSTOP DIAGNOSIS: Same  PROCEDURE: 1. Discectomy at C6-7 for decompression of spinal cord and exiting nerve roots  2. Placement of intervertebral biomechanical device Med[employer]onic Titan 71mm medium width lordotic cage 3. Placement of anterior instrumentation consisting of interbody plate and screws - 90mm Zevo Plate, 37SE screws x4  4. Use of morselized bone allograft  5. Arthrodesis C6-7, anterior interbody technique  6. Use of intraoperative microscope  SURGEON: Dr. Lisbeth Renshaw, MD  ASSISTANT: Cindra Presume, PA-C  ANESTHESIA: General Endotracheal  EBL: 50cc  SPECIMENS: None  DRAINS: None  COMPLICATIONS: None immediate  CONDITION: Hemodynamically stable to PACU  HISTORY: Kayla [name] is a 59 y.o. y.o. female who initially presented to the outpatient clinic with signs and symptoms consistent with cervical radiculopathy. She had previously underwent C5-6 and C4-5 ACDF. MRI demonstrated significant left-sided stenosis at C6-7. She had progression of pain despite conservative treatments. Further treatment options were discussed including surgical decompression and fusion and she elected to proceed with surgery. She was seen by ENT preoperatively and found to have normal vocal fold movement bilaterally. After a thorough discussion after which all questions were answered, informed consent was obtained.  PROCEDURE IN DETAIL: The patient was brought to the operating room and transferred to the operative table. After induction of general anesthesia, the patient was positioned on the operative table in the supine position with all pressure points meticulously padded.  The previous right sided transverse neck incision was identified and marked out, and the region was prepped and draped in the usual sterile fashion.  After timeout was conducted, the skin was infiltrated with local anesthetic.  Skin incision was then made sharply and Bovie electrocautery was used to dissect the subcutaneous tissue until the platysma was identified. The platysma was then divided and undermined. The sternocleidomastoid muscle was then identified and, utilizing natural fascial planes in the neck, the prevertebral fascia was identified and the carotid sheath was retracted laterally and the trachea and esophagus retracted medially.  The inferior margin of the previously placed anterior cervical plate was identified.  Dissection was then carried down across the C6 vertebral body until the C6-7 disc space was identified.  Subperiosteal dissection was further carried out to the superior portion of the C7 vertebral body and laterally to elevate the longus muscles bilaterally.  Table mounted retractors were then placed.  The microscope was then draped sterilely and brought into the field and the remainder of the case was done under the microscope using microdissection.  The disc space was incised sharply and rongeurs were use to initially complete a discectomy.  Kerrison punch was used to remove overlying osteophyte primarily from the anterior portion of the C6 vertebral body.  The high-speed drill was then used to complete discectomy until the posterior annulus was identified and removed and the posterior longitudinal ligament was identified. Using a nerve hook, the PLL was elevated, and Kerrison rongeurs were used to remove the posterior longitudinal ligament and the ventral thecal sac was identified. Using a combination of curettes and Rogers, complete decompression of the thecal sac and exiting nerve roots at this level was completed, and verified using micro-nerve hook.  There was relatively large osteophyte and thickening of the posterior longitudinal ligament primarily on the right side.  In order to achieve good decompression of the right C7 nerve root I did remove the posterior half of the uncovertebral joint.  I was then  able to visualize  the proximal portion of the C7 nerve root indicating good decompression.  Having completed our decompression, attention was turned to placement of the intervertebral device. Trial spacers were used to select a 6 mm lordotic medium width graft. This graft was then filled with morcellized allograft and tapped into place.  After placement of the intervertebral device, the above anterior cervical plate was selected, and placed across the interspace. Using a high-speed drill, the cortex of the cervical vertebral bodies was punctured, and screws inserted in C6 and C7.  Final fluoroscopic images in  lateral projections were taken to confirm good hardware placement.  At this point, after all counts were verified to be correct, meticulous hemostasis was secured using a combination of bipolar electrocautery and passive hemostatics. The platysma muscle was then closed using interrupted 3-0 Vicryl sutures, and the skin was closed with an interrupted 3-0 Vicry subcuticular stitch. Dermabond and sterile dressings were then applied and the drapes removed.  The patient tolerated the procedure well and was extubated in the room and taken to the postanesthesia care unit in stable condition.

## 2019-10-04 NOTE — Anesthesia Postprocedure Evaluation (Signed)
Anesthesia Post Note  Patient: Ninetta Adelstein  Procedure(s) Performed: Cervical six-seven Anterior cervical decompression/discectomy/fusion (N/A )     Patient location during evaluation: PACU Anesthesia Type: General Level of consciousness: sedated and patient cooperative Pain management: pain level controlled Vital Signs Assessment: post-procedure vital signs reviewed and stable Respiratory status: spontaneous breathing Cardiovascular status: stable Anesthetic complications: yes Comments: Made aware by another MDA that patient complaining of loss of crown. Pt with right lower molar crown missing. CRNAs and I at bedside. She denies any pulmonary issues.   No complications documented.  Last Vitals:  Vitals:   10/04/19 1420 10/04/19 1430  BP: (!) 127/56   Pulse: 71 72  Resp: 20 17  Temp:    SpO2: 97% 98%    Last Pain:  Vitals:   10/04/19 1350  TempSrc:   PainSc: Asleep                 Lewie Loron

## 2019-10-05 ENCOUNTER — Encounter (HOSPITAL_COMMUNITY): Payer: Self-pay | Admitting: Neurosurgery
